# Patient Record
Sex: Male | Born: 1950 | Race: White | Hispanic: No | Marital: Married | Smoking: Never smoker
Health system: Southern US, Community
[De-identification: ages and names within clinical notes are randomized; demographics above are authoritative.]

## PROBLEM LIST (undated history)

## (undated) DIAGNOSIS — R32 Unspecified urinary incontinence: Secondary | ICD-10-CM

## (undated) DIAGNOSIS — M5030 Other cervical disc degeneration, unspecified cervical region: Secondary | ICD-10-CM

## (undated) DIAGNOSIS — G4733 Obstructive sleep apnea (adult) (pediatric): Secondary | ICD-10-CM

## (undated) DIAGNOSIS — G20A1 Parkinson's disease without dyskinesia, without mention of fluctuations: Secondary | ICD-10-CM

## (undated) DIAGNOSIS — M47812 Spondylosis without myelopathy or radiculopathy, cervical region: Secondary | ICD-10-CM

## (undated) DIAGNOSIS — Z973 Presence of spectacles and contact lenses: Secondary | ICD-10-CM

## (undated) DIAGNOSIS — F32A Depression, unspecified: Secondary | ICD-10-CM

## (undated) DIAGNOSIS — G20 Parkinson's disease: Secondary | ICD-10-CM

## (undated) DIAGNOSIS — F329 Major depressive disorder, single episode, unspecified: Secondary | ICD-10-CM

## (undated) DIAGNOSIS — F419 Anxiety disorder, unspecified: Secondary | ICD-10-CM

## (undated) DIAGNOSIS — M1990 Unspecified osteoarthritis, unspecified site: Secondary | ICD-10-CM

## (undated) DIAGNOSIS — I10 Essential (primary) hypertension: Secondary | ICD-10-CM

## (undated) DIAGNOSIS — R51 Headache: Secondary | ICD-10-CM

## (undated) DIAGNOSIS — M48061 Spinal stenosis, lumbar region without neurogenic claudication: Secondary | ICD-10-CM

## (undated) DIAGNOSIS — N201 Calculus of ureter: Secondary | ICD-10-CM

## (undated) HISTORY — DX: Other cervical disc degeneration, unspecified cervical region: M50.30

## (undated) HISTORY — PX: KNEE ARTHROSCOPY W/ DEBRIDEMENT: SHX1867

---

## 1998-08-15 HISTORY — PX: CHOLECYSTECTOMY: SHX55

## 2005-10-13 ENCOUNTER — Ambulatory Visit (HOSPITAL_BASED_OUTPATIENT_CLINIC_OR_DEPARTMENT_OTHER): Admission: RE | Admit: 2005-10-13 | Discharge: 2005-10-13 | Payer: Self-pay | Admitting: Orthopedic Surgery

## 2006-04-28 ENCOUNTER — Encounter: Admission: RE | Admit: 2006-04-28 | Discharge: 2006-04-28 | Payer: Self-pay | Admitting: Orthopedic Surgery

## 2006-06-07 ENCOUNTER — Encounter: Admission: RE | Admit: 2006-06-07 | Discharge: 2006-06-07 | Payer: Self-pay | Admitting: Orthopaedic Surgery

## 2008-04-07 ENCOUNTER — Ambulatory Visit (HOSPITAL_BASED_OUTPATIENT_CLINIC_OR_DEPARTMENT_OTHER): Admission: RE | Admit: 2008-04-07 | Discharge: 2008-04-07 | Payer: Self-pay | Admitting: Orthopedic Surgery

## 2009-11-13 ENCOUNTER — Encounter: Admission: RE | Admit: 2009-11-13 | Discharge: 2009-11-13 | Payer: Self-pay | Admitting: Neurosurgery

## 2010-01-05 ENCOUNTER — Inpatient Hospital Stay (HOSPITAL_COMMUNITY): Admission: RE | Admit: 2010-01-05 | Discharge: 2010-01-07 | Payer: Self-pay | Admitting: Neurosurgery

## 2010-01-05 HISTORY — PX: LUMBAR LAMINECTOMY: SHX95

## 2010-08-06 ENCOUNTER — Encounter
Admission: RE | Admit: 2010-08-06 | Discharge: 2010-08-06 | Payer: Self-pay | Source: Home / Self Care | Attending: Neurosurgery | Admitting: Neurosurgery

## 2010-11-01 LAB — DIFFERENTIAL
Eosinophils Absolute: 0.1 10*3/uL (ref 0.0–0.7)
Eosinophils Relative: 2 % (ref 0–5)
Lymphocytes Relative: 28 % (ref 12–46)
Lymphs Abs: 2 10*3/uL (ref 0.7–4.0)
Monocytes Absolute: 0.7 10*3/uL (ref 0.1–1.0)
Monocytes Relative: 11 % (ref 3–12)

## 2010-11-01 LAB — CBC
MCHC: 35.3 g/dL (ref 30.0–36.0)
MCV: 85 fL (ref 78.0–100.0)
Platelets: 201 10*3/uL (ref 150–400)
RBC: 4.63 MIL/uL (ref 4.22–5.81)

## 2010-11-01 LAB — URINALYSIS, ROUTINE W REFLEX MICROSCOPIC
Bilirubin Urine: NEGATIVE
Nitrite: NEGATIVE
Specific Gravity, Urine: 1.026 (ref 1.005–1.030)
Urobilinogen, UA: 0.2 mg/dL (ref 0.0–1.0)
pH: 5 (ref 5.0–8.0)

## 2010-11-01 LAB — COMPREHENSIVE METABOLIC PANEL
ALT: 15 U/L (ref 0–53)
AST: 33 U/L (ref 0–37)
Albumin: 4.1 g/dL (ref 3.5–5.2)
CO2: 29 mEq/L (ref 19–32)
Calcium: 9.3 mg/dL (ref 8.4–10.5)
Creatinine, Ser: 0.78 mg/dL (ref 0.4–1.5)
GFR calc Af Amer: >60 mL/min (ref >60)
GFR calc non Af Amer: >60 mL/min (ref >60)
Sodium: 142 mEq/L (ref 135–145)
Total Protein: 7 g/dL (ref 6.0–8.3)

## 2010-11-01 LAB — PROTIME-INR: Prothrombin Time: 12.8 seconds (ref 11.6–15.2)

## 2010-11-01 LAB — SURGICAL PCR SCREEN
MRSA, PCR: POSITIVE — AB
Staphylococcus aureus: POSITIVE — AB

## 2010-12-28 NOTE — Op Note (Signed)
James Schultz, James Schultz              ACCOUNT NO.:  [id number]   MEDICAL RECORD NO.:  [id number]          PATIENT TYPE:  AMB   LOCATION:  DSC                          FACILITY:  [facility]   PHYSICIAN:  Feliberto Gottron. Turner Daniels, M.D.   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   POSTOPERATIVE DIAGNOSES:  Right knee complex posterior horn medial  meniscal tear, grade 3 chondromalacia pretty much global to the medial  femoral condyle, medial tibial condyle, lateral tibial condyle, and  focal to the lateral femoral condyle, also the trochlea.  There were  multiple cartilaginous loose bodies as well.   PROCEDURE:  Right knee arthroscopic partial medial meniscectomy, removal  of loose bodies, and debridement of chondromalacia.   SURGEON:  Feliberto Gottron.  Turner Daniels, MD   FIRST ASSISTANT:  None.   ANESTHETIC:  General LMA.   ESTIMATED BLOOD LOSS:  Minimal.   FLUID REPLACEMENT:  800 mL of crystalloid.   DRAINS PLACED:  None.   TOURNIQUET TIME:  None.   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  He desires elective arthroscopic evaluation and  treatment having failed conservative measures with anti-inflammatory  medicine, observation, and attempts at exercise.  The knee is actually  preventing exercise at this point.  In any event, risks and benefits of  surgery discussed, questions answered.   DESCRIPTION OF PROCEDURE:  The patient was identified by armband, taken  to the operating room at the [facility] where the  appropriate anesthetic monitors were attached and general LMA anesthesia  was induced with the patient in supine position.  A lateral post  was  applied to the table and the right lower extremity was prepped and  draped in usual sterile fashion from the ankle to the mid thigh.  The  parapatellar portals in the knee itself were then infiltrated with 5 mL  of 0.5% Marcaine and epinephrine in each portal region and another 10 mL  into the joint itself.  A standard time-out procedure was performed, and  then using a #11 blade, standard inferomedial and inferolateral  peripatellar portals were made.  The arthroscope was inserted through  the inferolateral portal with a pump pressure set between 60 and 90 mm  of pressure and the outflow through the inferomedial portal.  We  immediately encountered multiple cartilaginous loose bodies in the joint  fluid and these were taken through the outflow.  The patella had grade 2  chondromalacia that was lightly debrided.  The trochlea of the medial  femoral condyle, medial tibial condyle, lateral tibial condyle, and  trochlea of the lateral femoral condyle, all had grade 3 chondromalacia  with SLAP tears and this was debrided back to a stable margin.  These  were the donor sites of the loose bodies.  There was no actual bare bone  encountered.  The cruciate ligaments were intact.  The posterior horn of  the medial meniscus was shredded with complex tearing and this was  debrided back to a stable margin with a 3.5 Gator sucker shaver, the  same instrument that was used to debride the chondromalacia.  The  gutters were cleared medially and laterally.  The lateral meniscus was  noted to be in good condition and again the cruciate ligaments were in  good condition as well.  The knee was irrigated out with normal saline  solution.  The arthroscopic instruments were removed.  A dressing of  Xeroform, 4x4 dressing, sponges, Webril, and Ace wrap applied.  The  patient was then awakened and taken to the recovery room without  difficulty.      Feliberto Gottron. Turner Daniels, M.D.  Electronically Signed      FJR/MEDQ  D:  04/07/2008  T:  04/08/2008  Job:  [id number]

## 2010-12-31 NOTE — Op Note (Signed)
NAMECLARANCE, BOLLARD NO.:  [id number]   MEDICAL RECORD NO.:  [id number]          PATIENT TYPE:  AMB   LOCATION:  DSC                          FACILITY:  [facility]   PHYSICIAN:  Loreta Ave, M.D. DATE OF BIRTH:  [birth year]   DATE OF PROCEDURE:  10/13/2005  DATE OF DISCHARGE:                                 OPERATIVE REPORT   PREOPERATIVE DIAGNOSIS:  Tricompartmental chondromalacia and medial meniscus  tear, left knee.   POSTOPERATIVE DIAGNOSIS:  Tricompartmental chondromalacia and medial  meniscus tear, left knee, grade 2 and 3 chondromalacia throughout the  patellofemoral joint, portion of the medial femoral condyle, and lateral  tibial plateau, medial plica.   PROCEDURE:  Left knee exam under anesthesia, arthroscopy, debridement of  medial meniscus tear, excision medial plica, chondroplasty of the  patellofemoral joint as well as medial femoral condyle and lateral tibial  plateau.   SURGEON:  Loreta Ave, M.D.   ASSISTANT:  Genene Churn. Barry Dienes, P.A.-C.   ANESTHESIA:  General.   ESTIMATED BLOOD LOSS:  Minimal.   TOURNIQUET TIME:  None placed.   SPECIMENS:  None.   CULTURES:  None.   COMPLICATIONS:  None.   DRESSINGS:  Soft compressive.   PROCEDURE:  The patient was brought to the operating room and after adequate  anesthesia had been obtained, the left knee was examined.  Full motion.  Patellofemoral crepitus but no instability.  Negative Lachman and drawer.  Tourniquet and leg holder applied.  The leg was prepped and draped in the  usual sterile fashion.  Three portals were created, one superolateral and  one each medial and lateral parapatellar.  Inflow catheter introduced, knee  distended, arthroscope introduced, knee inspected.  Good patellofemoral  tracking but diffuse grade 2 and 3 changes both on the patella and trochlea.  Chondroplasty to a stable surface.  Almost the entire trochlea and patella  involved.  Large fibrotic medial plica  extending halfway across the  patellofemoral joint resected.  Medial meniscus complex tearing with part of  the meniscus folded underneath itself involving much of the middle and the  entire posterior third of the medial meniscus.  This was saucerized down  almost to the rim in the back.  Tapered into remaining meniscus salving the  anterior third.  Focal moderate grade 3 changes 1.5 cm in diameter right in  the middle of the weight-bearing dome treated with chondroplasty.  Not full  thickness.  Cruciate ligament intact and functional.  Some spurring of the  notch but not creating impingement.  Lateral meniscus intact.  Focal deep  grade 3 lesion centrally over the entire lateral tibial plateau weight-  bearing dome.  Chondroplasty to a stable surface.  Not grade 4.  At  completion, all recesses examined, all loose fragments removed.  The entire  knee examined and no other findings.  The instruments and fluid were  removed.  The portals of the knee were injected with Marcaine.  The portals  were closed with 4-0 nylon.  Sterile compressive dressing applied.  Anesthesia reversed.  Brought to the recovery room.  Tolerated the surgery  well without complications.      Loreta Ave, M.D.  Electronically Signed     DFM/MEDQ  D:  10/13/2005  T:  10/13/2005  Job:  [id number]

## 2013-02-21 NOTE — Progress Notes (Signed)
Need orders please - Pt coming for PREOP WED 02/27/13 - Thank you

## 2013-02-24 ENCOUNTER — Other Ambulatory Visit: Payer: Self-pay | Admitting: Orthopedic Surgery

## 2013-02-24 MED ORDER — DEXAMETHASONE SODIUM PHOSPHATE 10 MG/ML IJ SOLN: 10.0000 mg | Freq: Once | INTRAMUSCULAR | Status: DC

## 2013-02-24 MED ORDER — BUPIVACAINE LIPOSOME 1.3 % IJ SUSP: 20.0000 mL | Freq: Once | INTRAMUSCULAR | Status: DC

## 2013-02-24 NOTE — Progress Notes (Signed)
Preoperative surgical orders have been place into the Epic hospital system for James Schultz on 02/24/2013, 10:24 PM  by Patrica Duel for surgery on 03/04/2013.  Preop Total Knee orders including Experal, PO Tylenol, and IV Decadron as long as there are no contraindications to the above medications. Avel Peace, PA-C

## 2013-02-26 ENCOUNTER — Other Ambulatory Visit (HOSPITAL_COMMUNITY): Payer: Self-pay | Admitting: Orthopedic Surgery

## 2013-02-26 ENCOUNTER — Encounter (HOSPITAL_COMMUNITY): Payer: Self-pay | Admitting: Pharmacy Technician

## 2013-02-26 NOTE — Patient Instructions (Addendum)
20 James Schultz  02/26/2013   Your procedure is scheduled on:  03/04/13  MONDAY  Report to [facility] at  [phone]     AM.  Call this number if you have problems the morning of surgery: [phone]       Remember:bring cpap tubing and prongs   Do not eat food  Or drink :After Midnight.SUNDAY NIGHT   Take these medicines the morning of surgery with A SIP OF WATER:bupropion, sinemet, mirapex, azilect   .  Contacts, dentures or partial plates can not be worn to surgery  Leave suitcase in the car. After surgery it may be brought to your room.  For patients admitted to the hospital, checkout time is 11:00 AM day of  discharge.             SPECIAL INSTRUCTIONS- SEE [place] PREPARING FOR SURGERY INSTRUCTION SHEET-     DO NOT WEAR JEWELRY, LOTIONS, POWDERS, OR PERFUMES.  WOMEN-- DO NOT SHAVE LEGS OR UNDERARMS FOR 12 HOURS BEFORE SHOWERS. MEN MAY SHAVE FACE.  Patients discharged the day of surgery will not be allowed to drive home. IF going home the day of surgery, you must have a driver and someone to stay with you for the first 24 hours  Name and phone number of your driver:      ADMISSION                                                                  Please read over the following fact sheets that you were given: MRSA Information, Incentive Spirometry Sheet, Blood Transfusion Sheet  Information                                                                                   [place] PST [phone]  [phone]                 FAILURE TO FOLLOW THESE INSTRUCTIONS MAY RESULT IN  CANCELLATION   OF YOUR SURGERY                                                  Patient Signature _____________________________

## 2013-02-27 ENCOUNTER — Ambulatory Visit (HOSPITAL_COMMUNITY)
Admission: RE | Admit: 2013-02-27 | Discharge: 2013-02-27 | Disposition: A | Discharge status: Home / Self Care | Payer: Medicare Other | Source: Ambulatory Visit | Attending: Orthopedic Surgery | Admitting: Orthopedic Surgery

## 2013-02-27 ENCOUNTER — Encounter (HOSPITAL_COMMUNITY)
Admission: RE | Admit: 2013-02-27 | Discharge: 2013-02-27 | Disposition: A | Discharge status: Home / Self Care | Payer: Medicare Other | Source: Ambulatory Visit | Attending: Orthopedic Surgery | Admitting: Orthopedic Surgery

## 2013-02-27 ENCOUNTER — Encounter (HOSPITAL_COMMUNITY): Payer: Self-pay

## 2013-02-27 DIAGNOSIS — Z01818 Encounter for other preprocedural examination: Secondary | ICD-10-CM | POA: Insufficient documentation

## 2013-02-27 DIAGNOSIS — Z01810 Encounter for preprocedural cardiovascular examination: Secondary | ICD-10-CM | POA: Insufficient documentation

## 2013-02-27 DIAGNOSIS — M1710 Unilateral primary osteoarthritis, unspecified knee: Secondary | ICD-10-CM | POA: Insufficient documentation

## 2013-02-27 DIAGNOSIS — J8410 Pulmonary fibrosis, unspecified: Secondary | ICD-10-CM | POA: Insufficient documentation

## 2013-02-27 HISTORY — DX: Headache: R51

## 2013-02-27 HISTORY — DX: Major depressive disorder, single episode, unspecified: F32.9

## 2013-02-27 HISTORY — DX: Parkinson's disease without dyskinesia, without mention of fluctuations: G20.A1

## 2013-02-27 HISTORY — DX: Anxiety disorder, unspecified: F41.9

## 2013-02-27 HISTORY — DX: Parkinson's disease: G20

## 2013-02-27 HISTORY — DX: Depression, unspecified: F32.A

## 2013-02-27 HISTORY — DX: Essential (primary) hypertension: I10

## 2013-02-27 HISTORY — DX: Spinal stenosis, lumbar region without neurogenic claudication: M48.061

## 2013-02-27 LAB — URINALYSIS, ROUTINE W REFLEX MICROSCOPIC
Glucose, UA: NEGATIVE mg/dL
Hgb urine dipstick: NEGATIVE
Ketones, ur: NEGATIVE mg/dL
Protein, ur: NEGATIVE mg/dL
pH: 6.5 (ref 5.0–8.0)

## 2013-02-27 LAB — CBC
HCT: 41.5 % (ref 39.0–52.0)
Hemoglobin: 13.9 g/dL (ref 13.0–17.0)
MCH: 28.7 pg (ref 26.0–34.0)
MCV: 85.7 fL (ref 78.0–100.0)
Platelets: 209 10*3/uL (ref 150–400)
RBC: 4.84 MIL/uL (ref 4.22–5.81)

## 2013-02-27 LAB — COMPREHENSIVE METABOLIC PANEL
BUN: 15 mg/dL (ref 6–23)
CO2: 30 mEq/L (ref 19–32)
Calcium: 9.8 mg/dL (ref 8.4–10.5)
Creatinine, Ser: 0.79 mg/dL (ref 0.50–1.35)
GFR calc Af Amer: >90 mL/min (ref >90)
GFR calc non Af Amer: >90 mL/min (ref >90)
Glucose, Bld: 100 mg/dL — ABNORMAL HIGH (ref 70–99)

## 2013-02-27 LAB — ABO/RH: ABO/RH(D): A NEG

## 2013-02-27 LAB — PROTIME-INR
INR: 0.97 (ref 0.00–1.49)
Prothrombin Time: 12.7 seconds (ref 11.6–15.2)

## 2013-03-03 ENCOUNTER — Other Ambulatory Visit: Payer: Self-pay | Admitting: Surgical

## 2013-03-03 NOTE — H&P (Signed)
TOTAL KNEE ADMISSION H&P  Patient is being admitted for left total knee arthroplasty.  Subjective:  Chief Complaint:left knee pain.  HPI: James Schultz, 62 y.o. male, has a history of pain and functional disability in the left knee due to arthritis and has failed non-surgical conservative treatments for greater than 12 weeks to includeNSAID's and/or analgesics, corticosteriod injections, viscosupplementation injections and activity modification.  Onset of symptoms was gradual, starting 3 years ago with gradually worsening course since that time. The patient noted no past surgery on the left knee(s).  Patient currently rates pain in the left knee(s) at 5 out of 10 with activity. Patient has night pain, worsening of pain with activity and weight bearing, pain that interferes with activities of daily living, pain with passive range of motion, crepitus and joint swelling.  Patient has evidence of periarticular osteophytes and joint space narrowing by imaging studies.  There is no active infection.   Past Medical History  Diagnosis Date  . Hypertension   . Anxiety   . Depression   . Parkinson's disease     lov note dr love 10-25-2011, lov note [facility] everywhere 02-19-2013 dr Azucena Cecil scott  . Headache(784.0)   . Overactive bladder   . Overactive bladder   . Spinal stenosis of lumbar region   . Dysrhythmia   . Sleep apnea     cpap, setting of 12, uses [place] prongs no mask used    Past Surgical History  Procedure Laterality Date  . Back surgery  2010    lumbar  . Right knee arthroscopy  2009     Current outpatient prescriptions: buPROPion (WELLBUTRIN SR) 150 MG 12 hr tablet, Take 150 mg by mouth 2 (two) times daily., Disp: , Rfl: ;  carbidopa-levodopa (SINEMET IR) 25-100 MG per tablet, Take 1 tablet by mouth 5 (five) times daily., Disp: , Rfl: ;  lisinopril (PRINIVIL,ZESTRIL) 10 MG tablet, Take 10 mg by mouth every morning., Disp: , Rfl:  LORazepam (ATIVAN) 1 MG tablet, Take 1 mg by mouth 4  (four) times daily as needed for anxiety., Disp: , Rfl: ;  oxyCODONE-acetaminophen (PERCOCET) 10-325 MG per tablet, Take 1-2 tablets by mouth every 8 (eight) hours as needed for pain., Disp: , Rfl: ;  pramipexole (MIRAPEX) 1 MG tablet, Take 0.5-1 mg by mouth 3 (three) times daily. Takes 1 tablet in the morning 6am and half tablet at 10 am and half tablet at 2 pm, Disp: , Rfl:  rasagiline (AZILECT) 1 MG TABS, Take 1 mg by mouth every morning. , Disp: , Rfl:   No Known Allergies  History  Substance Use Topics  . Smoking status: Never Smoker   . Smokeless tobacco: Never Used  . Alcohol Use: No    Family History Father deceased due to sepsis Mother deceased due to Alzheimer's   Review of Systems  Constitutional: Negative.   HENT: Negative.  Negative for neck pain.   Eyes: Positive for double vision. Negative for blurred vision, photophobia, pain, discharge and redness.  Respiratory: Positive for cough and shortness of breath. Negative for hemoptysis and sputum production.        With exertion  Cardiovascular: Positive for leg swelling. Negative for chest pain, palpitations, orthopnea, claudication and PND.  Gastrointestinal: Positive for constipation. Negative for heartburn, nausea, vomiting, abdominal pain, diarrhea, blood in stool and melena.  Genitourinary: Positive for frequency. Negative for dysuria, urgency, hematuria and flank pain.  Musculoskeletal: Positive for back pain and joint pain. Negative for myalgias and falls.  Left knee pain  Skin: Negative.   Neurological: Positive for tremors. Negative for dizziness, tingling, sensory change, speech change, focal weakness, seizures and loss of consciousness.  Endo/Heme/Allergies: Negative.   Psychiatric/Behavioral: Negative for depression, suicidal ideas, hallucinations, memory loss and substance abuse. The patient has insomnia.     Objective:  Physical Exam  Constitutional: He is oriented to person, place, and time. He appears  well-developed and well-nourished. No distress.  HENT:  Head: Normocephalic and atraumatic.  Right Ear: External ear normal.  Nose: Nose normal.  Mouth/Throat: Oropharynx is clear and moist.  Eyes: Conjunctivae and EOM are normal.  Neck: Normal range of motion. Neck supple. No tracheal deviation present. No thyromegaly present.  Cardiovascular: Normal rate, regular rhythm, normal heart sounds and intact distal pulses.   No murmur heard. Respiratory: Effort normal. No respiratory distress. He has decreased breath sounds. He has no wheezes. He exhibits no tenderness.  GI: Soft. Bowel sounds are normal. He exhibits no distension and no mass. There is no tenderness.  Musculoskeletal:       Right hip: Normal.       Left hip: Normal.       Right knee: Normal.       Left knee: He exhibits decreased range of motion and swelling. He exhibits no effusion and no erythema. Tenderness found. Medial joint line and lateral joint line tenderness noted.       Right lower leg: He exhibits no tenderness and no swelling.       Left lower leg: He exhibits no tenderness and no swelling.  His hips show normal motion, no discomfort. The left knee shows range about 5 to 110. There is moderate crepitus on range of motion. He has significant medial greater than lateral jointline tenderness and no instability noted.  Lymphadenopathy:    He has no cervical adenopathy.  Neurological: He is alert and oriented to person, place, and time. He has normal strength and normal reflexes. He displays tremor. No sensory deficit.  Skin: No rash noted. He is not diaphoretic. No erythema.  Psychiatric: He has a normal mood and affect. His behavior is normal.    Vitals Weight: 253 lb Height: 69.5 in Body Surface Area: 2.37 m Body Mass Index: 36.83 kg/m Pulse: 72 (Regular) BP: 144/86 (Sitting, Left Arm, Standard)  Imaging Review Plain radiographs demonstrate severe degenerative joint disease of the left knee(s).  The overall alignment ismild varus. The bone quality appears to be fair for age and reported activity level.  Assessment/Plan:  End stage arthritis, left knee   The patient history, physical examination, clinical judgment of the provider and imaging studies are consistent with end stage degenerative joint disease of the left knee(s) and total knee arthroplasty is deemed medically necessary. The treatment options including medical management, injection therapy arthroscopy and arthroplasty were discussed at length. The risks and benefits of total knee arthroplasty were presented and reviewed. The risks due to aseptic loosening, infection, stiffness, patella tracking problems, thromboembolic complications and other imponderables were discussed. The patient acknowledged the explanation, agreed to proceed with the plan and consent was signed. Patient is being admitted for inpatient treatment for surgery, pain control, PT, OT, prophylactic antibiotics, VTE prophylaxis, progressive ambulation and ADL's and discharge planning. The patient is planning to be discharged home with home health services    [place], [place]

## 2013-03-04 ENCOUNTER — Encounter (HOSPITAL_COMMUNITY): Payer: Self-pay | Admitting: *Deleted

## 2013-03-04 ENCOUNTER — Encounter (HOSPITAL_COMMUNITY)
Admission: RE | Disposition: A | Discharge status: Home Health | Payer: Self-pay | Source: Ambulatory Visit | Attending: Orthopedic Surgery

## 2013-03-04 ENCOUNTER — Inpatient Hospital Stay (HOSPITAL_COMMUNITY): Payer: Medicare Other | Admitting: *Deleted

## 2013-03-04 ENCOUNTER — Inpatient Hospital Stay (HOSPITAL_COMMUNITY)
Admission: RE | Admit: 2013-03-04 | Discharge: 2013-03-06 | DRG: 470 | Disposition: A | Discharge status: Home Health | Payer: Medicare Other | Source: Ambulatory Visit | Attending: Orthopedic Surgery | Admitting: Orthopedic Surgery

## 2013-03-04 DIAGNOSIS — I10 Essential (primary) hypertension: Secondary | ICD-10-CM

## 2013-03-04 DIAGNOSIS — F3289 Other specified depressive episodes: Secondary | ICD-10-CM | POA: Diagnosis present

## 2013-03-04 DIAGNOSIS — G20 Parkinson's disease: Secondary | ICD-10-CM | POA: Diagnosis present

## 2013-03-04 DIAGNOSIS — Z96652 Presence of left artificial knee joint: Secondary | ICD-10-CM

## 2013-03-04 DIAGNOSIS — Z96659 Presence of unspecified artificial knee joint: Secondary | ICD-10-CM

## 2013-03-04 DIAGNOSIS — F411 Generalized anxiety disorder: Secondary | ICD-10-CM | POA: Diagnosis present

## 2013-03-04 DIAGNOSIS — G20A1 Parkinson's disease without dyskinesia, without mention of fluctuations: Secondary | ICD-10-CM | POA: Diagnosis present

## 2013-03-04 DIAGNOSIS — G4730 Sleep apnea, unspecified: Secondary | ICD-10-CM | POA: Diagnosis present

## 2013-03-04 DIAGNOSIS — N318 Other neuromuscular dysfunction of bladder: Secondary | ICD-10-CM | POA: Diagnosis present

## 2013-03-04 DIAGNOSIS — G4733 Obstructive sleep apnea (adult) (pediatric): Secondary | ICD-10-CM

## 2013-03-04 DIAGNOSIS — M179 Osteoarthritis of knee, unspecified: Secondary | ICD-10-CM | POA: Diagnosis present

## 2013-03-04 DIAGNOSIS — Z6835 Body mass index (BMI) 35.0-35.9, adult: Secondary | ICD-10-CM

## 2013-03-04 DIAGNOSIS — M1710 Unilateral primary osteoarthritis, unspecified knee: Secondary | ICD-10-CM | POA: Diagnosis present

## 2013-03-04 DIAGNOSIS — F329 Major depressive disorder, single episode, unspecified: Secondary | ICD-10-CM | POA: Diagnosis present

## 2013-03-04 HISTORY — PX: TOTAL KNEE ARTHROPLASTY: SHX125

## 2013-03-04 LAB — TYPE AND SCREEN: ABO/RH(D): A NEG

## 2013-03-04 LAB — ABO/RH: ABO/RH(D): A NEG

## 2013-03-04 SURGERY — ARTHROPLASTY, KNEE, TOTAL
Anesthesia: General | Site: Knee | Laterality: Left | Wound class: Clean

## 2013-03-04 MED ORDER — MENTHOL 3 MG MT LOZG: 1.0000 | LOZENGE | OROMUCOSAL | Status: DC | PRN

## 2013-03-04 MED ORDER — ACETAMINOPHEN 650 MG RE SUPP
650.0000 mg | Freq: Four times a day (QID) | RECTAL | Status: AC
  Filled 2013-03-04 (×4): qty 1

## 2013-03-04 MED ORDER — TRANEXAMIC ACID 100 MG/ML IV SOLN
1000.0000 mg | INTRAVENOUS | Status: AC
  Administered 2013-03-04: 1000 mg via INTRAVENOUS
  Filled 2013-03-04: qty 10

## 2013-03-04 MED ORDER — LORAZEPAM 1 MG PO TABS
1.0000 mg | ORAL_TABLET | Freq: Four times a day (QID) | ORAL | Status: DC | PRN
  Administered 2013-03-04 – 2013-03-05 (×3): 1 mg via ORAL
  Filled 2013-03-04 (×3): qty 1

## 2013-03-04 MED ORDER — BUPIVACAINE HCL (PF) 0.25 % IJ SOLN
INTRAMUSCULAR | Status: DC | PRN
  Administered 2013-03-04: 20 mL

## 2013-03-04 MED ORDER — DEXAMETHASONE SODIUM PHOSPHATE 10 MG/ML IJ SOLN: 10.0000 mg | Freq: Every day | INTRAMUSCULAR | Status: AC

## 2013-03-04 MED ORDER — PRAMIPEXOLE DIHYDROCHLORIDE 0.25 MG PO TABS
0.5000 mg | ORAL_TABLET | ORAL | Status: DC
  Administered 2013-03-04: 17:00:00 via ORAL
  Administered 2013-03-05 – 2013-03-06 (×3): 0.5 mg via ORAL
  Filled 2013-03-04 (×5): qty 2

## 2013-03-04 MED ORDER — DIPHENHYDRAMINE HCL 12.5 MG/5ML PO ELIX
12.5000 mg | ORAL_SOLUTION | ORAL | Status: DC | PRN
  Filled 2013-03-04: qty 10

## 2013-03-04 MED ORDER — SODIUM CHLORIDE 0.9 % IJ SOLN
INTRAMUSCULAR | Status: DC | PRN
  Administered 2013-03-04: 30 mL

## 2013-03-04 MED ORDER — HYDROMORPHONE HCL PF 1 MG/ML IJ SOLN
INTRAMUSCULAR | Status: DC | PRN
  Administered 2013-03-04: 2 mg via INTRAVENOUS

## 2013-03-04 MED ORDER — METOCLOPRAMIDE HCL 5 MG PO TABS
5.0000 mg | ORAL_TABLET | Freq: Three times a day (TID) | ORAL | Status: DC | PRN
  Filled 2013-03-04: qty 2

## 2013-03-04 MED ORDER — METOCLOPRAMIDE HCL 5 MG/ML IJ SOLN: 5.0000 mg | Freq: Three times a day (TID) | INTRAMUSCULAR | Status: DC | PRN

## 2013-03-04 MED ORDER — KETOROLAC TROMETHAMINE 15 MG/ML IJ SOLN
15.0000 mg | Freq: Four times a day (QID) | INTRAMUSCULAR | Status: AC | PRN
  Administered 2013-03-04 (×2): 15 mg via INTRAVENOUS
  Filled 2013-03-04 (×2): qty 1

## 2013-03-04 MED ORDER — 0.9 % SODIUM CHLORIDE (POUR BTL) OPTIME
TOPICAL | Status: DC | PRN
  Administered 2013-03-04: 1000 mL

## 2013-03-04 MED ORDER — CEFAZOLIN SODIUM-DEXTROSE 2-3 GM-% IV SOLR
2.0000 g | INTRAVENOUS | Status: AC
Start: 2013-03-04 — End: 2013-03-04
  Administered 2013-03-04: 2 g via INTRAVENOUS

## 2013-03-04 MED ORDER — RIVAROXABAN 10 MG PO TABS
10.0000 mg | ORAL_TABLET | Freq: Every day | ORAL | Status: DC
  Administered 2013-03-05 – 2013-03-06 (×2): 10 mg via ORAL
  Filled 2013-03-04 (×3): qty 1

## 2013-03-04 MED ORDER — FLEET ENEMA 7-19 GM/118ML RE ENEM: 1.0000 | ENEMA | Freq: Once | RECTAL | Status: AC | PRN

## 2013-03-04 MED ORDER — SUCCINYLCHOLINE CHLORIDE 20 MG/ML IJ SOLN
INTRAMUSCULAR | Status: DC | PRN
  Administered 2013-03-04: 100 mg via INTRAVENOUS

## 2013-03-04 MED ORDER — OXYCODONE HCL 5 MG PO TABS
5.0000 mg | ORAL_TABLET | ORAL | Status: DC | PRN
  Administered 2013-03-04: 10 mg via ORAL
  Administered 2013-03-04 (×2): 5 mg via ORAL
  Administered 2013-03-05 – 2013-03-06 (×8): 10 mg via ORAL
  Filled 2013-03-04 (×4): qty 2
  Filled 2013-03-04: qty 1
  Filled 2013-03-04 (×6): qty 2

## 2013-03-04 MED ORDER — BUPROPION HCL ER (SR) 150 MG PO TB12
150.0000 mg | ORAL_TABLET | Freq: Two times a day (BID) | ORAL | Status: DC
  Filled 2013-03-04 (×2): qty 1

## 2013-03-04 MED ORDER — BISACODYL 10 MG RE SUPP: 10.0000 mg | Freq: Every day | RECTAL | Status: DC | PRN

## 2013-03-04 MED ORDER — DEXAMETHASONE SODIUM PHOSPHATE 4 MG/ML IJ SOLN
INTRAMUSCULAR | Status: DC | PRN
  Administered 2013-03-04: 10 mg via INTRAVENOUS

## 2013-03-04 MED ORDER — BUPROPION HCL ER (SR) 150 MG PO TB12
150.0000 mg | ORAL_TABLET | Freq: Two times a day (BID) | ORAL | Status: DC
  Administered 2013-03-04 – 2013-03-06 (×4): 150 mg via ORAL
  Filled 2013-03-04 (×5): qty 1

## 2013-03-04 MED ORDER — ACETAMINOPHEN 500 MG PO TABS
1000.0000 mg | ORAL_TABLET | Freq: Four times a day (QID) | ORAL | Status: AC
  Administered 2013-03-04 – 2013-03-05 (×4): 1000 mg via ORAL
  Filled 2013-03-04 (×6): qty 2

## 2013-03-04 MED ORDER — BUPIVACAINE LIPOSOME 1.3 % IJ SUSP
20.0000 mL | Freq: Once | INTRAMUSCULAR | Status: DC
  Filled 2013-03-04: qty 20

## 2013-03-04 MED ORDER — KETAMINE HCL 10 MG/ML IJ SOLN
INTRAMUSCULAR | Status: DC | PRN
  Administered 2013-03-04: 50 mg via INTRAVENOUS

## 2013-03-04 MED ORDER — PROMETHAZINE HCL 25 MG/ML IJ SOLN: 6.2500 mg | INTRAMUSCULAR | Status: DC | PRN

## 2013-03-04 MED ORDER — ONDANSETRON HCL 4 MG/2ML IJ SOLN: 4.0000 mg | Freq: Four times a day (QID) | INTRAMUSCULAR | Status: DC | PRN

## 2013-03-04 MED ORDER — DEXTROSE 5 % IV SOLN
500.0000 mg | Freq: Four times a day (QID) | INTRAVENOUS | Status: DC | PRN
  Filled 2013-03-04: qty 5

## 2013-03-04 MED ORDER — BUPIVACAINE LIPOSOME 1.3 % IJ SUSP
INTRAMUSCULAR | Status: DC | PRN
  Administered 2013-03-04: 20 mL

## 2013-03-04 MED ORDER — CEFAZOLIN SODIUM-DEXTROSE 2-3 GM-% IV SOLR
2.0000 g | Freq: Four times a day (QID) | INTRAVENOUS | Status: AC
  Administered 2013-03-04 (×2): 2 g via INTRAVENOUS
  Filled 2013-03-04 (×2): qty 50

## 2013-03-04 MED ORDER — DOCUSATE SODIUM 100 MG PO CAPS
100.0000 mg | ORAL_CAPSULE | Freq: Two times a day (BID) | ORAL | Status: DC
  Administered 2013-03-04 – 2013-03-06 (×5): 100 mg via ORAL

## 2013-03-04 MED ORDER — SODIUM CHLORIDE 0.9 % IV SOLN: INTRAVENOUS | Status: DC

## 2013-03-04 MED ORDER — CARBIDOPA-LEVODOPA 25-100 MG PO TABS
1.0000 | ORAL_TABLET | Freq: Every day | ORAL | Status: DC
  Administered 2013-03-04 – 2013-03-06 (×10): 1 via ORAL
  Filled 2013-03-04 (×14): qty 1

## 2013-03-04 MED ORDER — DEXTROSE-NACL 5-0.9 % IV SOLN
INTRAVENOUS | Status: DC
  Administered 2013-03-04 (×2): via INTRAVENOUS

## 2013-03-04 MED ORDER — PROPOFOL 10 MG/ML IV BOLUS
INTRAVENOUS | Status: DC | PRN
  Administered 2013-03-04: 200 mg via INTRAVENOUS

## 2013-03-04 MED ORDER — ONDANSETRON HCL 4 MG/2ML IJ SOLN
INTRAMUSCULAR | Status: DC | PRN
  Administered 2013-03-04: 4 mg via INTRAVENOUS

## 2013-03-04 MED ORDER — MIDAZOLAM HCL 5 MG/5ML IJ SOLN
INTRAMUSCULAR | Status: DC | PRN
  Administered 2013-03-04: 2 mg via INTRAVENOUS

## 2013-03-04 MED ORDER — ONDANSETRON HCL 4 MG PO TABS: 4.0000 mg | ORAL_TABLET | Freq: Four times a day (QID) | ORAL | Status: DC | PRN

## 2013-03-04 MED ORDER — LACTATED RINGERS IV SOLN
INTRAVENOUS | Status: DC
  Administered 2013-03-04 (×2): via INTRAVENOUS
  Administered 2013-03-04: 1000 mL via INTRAVENOUS

## 2013-03-04 MED ORDER — RASAGILINE MESYLATE 1 MG PO TABS
1.0000 mg | ORAL_TABLET | Freq: Every morning | ORAL | Status: DC
  Administered 2013-03-05 – 2013-03-06 (×2): 1 mg via ORAL
  Filled 2013-03-04 (×2): qty 1

## 2013-03-04 MED ORDER — DEXAMETHASONE 6 MG PO TABS
10.0000 mg | ORAL_TABLET | Freq: Every day | ORAL | Status: AC
  Administered 2013-03-05: 10 mg via ORAL
  Filled 2013-03-04: qty 1

## 2013-03-04 MED ORDER — PRAMIPEXOLE DIHYDROCHLORIDE 0.25 MG PO TABS: 0.5000 mg | ORAL_TABLET | Freq: Three times a day (TID) | ORAL | Status: DC

## 2013-03-04 MED ORDER — TRAMADOL HCL 50 MG PO TABS: 50.0000 mg | ORAL_TABLET | Freq: Four times a day (QID) | ORAL | Status: DC | PRN

## 2013-03-04 MED ORDER — PRAMIPEXOLE DIHYDROCHLORIDE 1 MG PO TABS
1.0000 mg | ORAL_TABLET | ORAL | Status: DC
  Administered 2013-03-05 – 2013-03-06 (×2): 1 mg via ORAL
  Filled 2013-03-04 (×4): qty 1

## 2013-03-04 MED ORDER — MORPHINE SULFATE 2 MG/ML IJ SOLN: 1.0000 mg | INTRAMUSCULAR | Status: DC | PRN

## 2013-03-04 MED ORDER — SODIUM CHLORIDE 0.9 % IR SOLN
Status: DC | PRN
  Administered 2013-03-04: 1000 mL

## 2013-03-04 MED ORDER — PHENOL 1.4 % MT LIQD: 1.0000 | OROMUCOSAL | Status: DC | PRN

## 2013-03-04 MED ORDER — ACETAMINOPHEN 500 MG PO TABS
1000.0000 mg | ORAL_TABLET | Freq: Once | ORAL | Status: AC
  Administered 2013-03-04: 1000 mg via ORAL
  Filled 2013-03-04: qty 2

## 2013-03-04 MED ORDER — METHOCARBAMOL 500 MG PO TABS
500.0000 mg | ORAL_TABLET | Freq: Four times a day (QID) | ORAL | Status: DC | PRN
  Administered 2013-03-04 – 2013-03-06 (×6): 500 mg via ORAL
  Filled 2013-03-04 (×6): qty 1

## 2013-03-04 MED ORDER — POLYETHYLENE GLYCOL 3350 17 G PO PACK: 17.0000 g | PACK | Freq: Every day | ORAL | Status: DC | PRN

## 2013-03-04 MED ORDER — LACTATED RINGERS IV SOLN: INTRAVENOUS | Status: DC

## 2013-03-04 MED ORDER — HYDROMORPHONE HCL PF 1 MG/ML IJ SOLN
0.2500 mg | INTRAMUSCULAR | Status: DC | PRN
  Administered 2013-03-04 (×2): 0.5 mg via INTRAVENOUS

## 2013-03-04 SURGICAL SUPPLY — 58 items
BAG SPEC THK2 15X12 ZIP CLS (MISCELLANEOUS) ×1
BAG ZIPLOCK 12X15 (MISCELLANEOUS) ×2 IMPLANT
BANDAGE ELASTIC 6 VELCRO ST LF (GAUZE/BANDAGES/DRESSINGS) ×2 IMPLANT
BANDAGE ESMARK 6X9 LF (GAUZE/BANDAGES/DRESSINGS) ×1 IMPLANT
BLADE SAG 18X100X1.27 (BLADE) ×2 IMPLANT
BLADE SAW SGTL 11.0X1.19X90.0M (BLADE) ×2 IMPLANT
BNDG CMPR 9X6 STRL LF SNTH (GAUZE/BANDAGES/DRESSINGS) ×1
BNDG ESMARK 6X9 LF (GAUZE/BANDAGES/DRESSINGS) ×2
BOWL SMART MIX CTS (DISPOSABLE) ×2 IMPLANT
CAPT RP KNEE ×1 IMPLANT
CEMENT HV SMART SET (Cement) ×4 IMPLANT
CLOTH BEACON ORANGE TIMEOUT ST (SAFETY) ×2 IMPLANT
CUFF TOURN SGL QUICK 34 (TOURNIQUET CUFF) ×2
CUFF TRNQT CYL 34X4X40X1 (TOURNIQUET CUFF) ×1 IMPLANT
DECANTER SPIKE VIAL GLASS SM (MISCELLANEOUS) ×2 IMPLANT
DRAPE EXTREMITY T 121X128X90 (DRAPE) ×2 IMPLANT
DRAPE POUCH INSTRU U-SHP 10X18 (DRAPES) ×2 IMPLANT
DRAPE U-SHAPE 47X51 STRL (DRAPES) ×2 IMPLANT
DRSG ADAPTIC 3X8 NADH LF (GAUZE/BANDAGES/DRESSINGS) ×2 IMPLANT
DRSG PAD ABDOMINAL 8X10 ST (GAUZE/BANDAGES/DRESSINGS) ×2 IMPLANT
DURAPREP 26ML APPLICATOR (WOUND CARE) ×2 IMPLANT
ELECT REM PT RETURN 9FT ADLT (ELECTROSURGICAL) ×2
ELECTRODE REM PT RTRN 9FT ADLT (ELECTROSURGICAL) ×1 IMPLANT
EVACUATOR 1/8 PVC DRAIN (DRAIN) ×2 IMPLANT
FACESHIELD LNG OPTICON STERILE (SAFETY) ×10 IMPLANT
GLOVE BIO SURGEON STRL SZ7.5 (GLOVE) IMPLANT
GLOVE BIO SURGEON STRL SZ8 (GLOVE) ×2 IMPLANT
GLOVE BIOGEL PI IND STRL 8 (GLOVE) ×1 IMPLANT
GLOVE BIOGEL PI INDICATOR 8 (GLOVE) ×1
GLOVE SURG SS PI 6.5 STRL IVOR (GLOVE) ×4 IMPLANT
GOWN BRE IMP PREV XXLGXLNG (GOWN DISPOSABLE) ×1 IMPLANT
GOWN STRL NON-REIN LRG LVL3 (GOWN DISPOSABLE) ×4 IMPLANT
GOWN STRL REIN XL XLG (GOWN DISPOSABLE) IMPLANT
HANDPIECE INTERPULSE COAX TIP (DISPOSABLE) ×2
IMMOBILIZER KNEE 20 (SOFTGOODS) ×2
IMMOBILIZER KNEE 20 THIGH 36 (SOFTGOODS) ×1 IMPLANT
KIT BASIN OR (CUSTOM PROCEDURE TRAY) ×2 IMPLANT
MANIFOLD NEPTUNE II (INSTRUMENTS) ×2 IMPLANT
NDL SAFETY ECLIPSE 18X1.5 (NEEDLE) ×2 IMPLANT
NEEDLE HYPO 18GX1.5 SHARP (NEEDLE) ×4
NS IRRIG 1000ML POUR BTL (IV SOLUTION) ×2 IMPLANT
PACK TOTAL JOINT (CUSTOM PROCEDURE TRAY) ×2 IMPLANT
PADDING CAST COTTON 6X4 STRL (CAST SUPPLIES) ×5 IMPLANT
POSITIONER SURGICAL ARM (MISCELLANEOUS) ×2 IMPLANT
SET HNDPC FAN SPRY TIP SCT (DISPOSABLE) ×1 IMPLANT
SPONGE GAUZE 4X4 12PLY (GAUZE/BANDAGES/DRESSINGS) ×2 IMPLANT
STRIP CLOSURE SKIN 1/2X4 (GAUZE/BANDAGES/DRESSINGS) ×4 IMPLANT
SUCTION FRAZIER 12FR DISP (SUCTIONS) ×2 IMPLANT
SUT MNCRL AB 4-0 PS2 18 (SUTURE) ×2 IMPLANT
SUT VIC AB 2-0 CT1 27 (SUTURE) ×6
SUT VIC AB 2-0 CT1 TAPERPNT 27 (SUTURE) ×3 IMPLANT
SUT VLOC 180 0 24IN GS25 (SUTURE) ×2 IMPLANT
SYR 20CC LL (SYRINGE) ×2 IMPLANT
SYR 50ML LL SCALE MARK (SYRINGE) ×2 IMPLANT
TOWEL OR 17X26 10 PK STRL BLUE (TOWEL DISPOSABLE) ×4 IMPLANT
TRAY FOLEY CATH 14FRSI W/METER (CATHETERS) ×2 IMPLANT
WATER STERILE IRR 1500ML POUR (IV SOLUTION) ×3 IMPLANT
WRAP KNEE MAXI GEL POST OP (GAUZE/BANDAGES/DRESSINGS) ×2 IMPLANT

## 2013-03-04 NOTE — Transfer of Care (Signed)
Immediate Anesthesia Transfer of Care Note  Patient: James Schultz  Procedure(s) Performed: Procedure(s) (LRB): LEFT TOTAL KNEE ARTHROPLASTY (Left)  Patient Location: PACU  Anesthesia Type: General  Level of Consciousness: sedated, patient cooperative and responds to stimulaton  Airway & Oxygen Therapy: Patient Spontanous Breathing and Patient connected to face mask oxgen  Post-op Assessment: Report given to PACU RN and Post -op Vital signs reviewed and stable  Post vital signs: Reviewed and stable  Complications: No apparent anesthesia complications

## 2013-03-04 NOTE — Interval H&P Note (Signed)
History and Physical Interval Note:  03/04/2013 7:00 AM  James Schultz  has presented today for surgery, with the diagnosis of OA LEFT KNEE   The various methods of treatment have been discussed with the patient and family. After consideration of risks, benefits and other options for treatment, the patient has consented to  Procedure(s): LEFT TOTAL KNEE ARTHROPLASTY (Left) as a surgical intervention .  The patient's history has been reviewed, patient examined, no change in status, stable for surgery.  I have reviewed the patient's chart and labs.  Questions were answered to the patient's satisfaction.     Loanne Drilling

## 2013-03-04 NOTE — Anesthesia Preprocedure Evaluation (Signed)
Anesthesia Evaluation  Patient identified by MRN, date of birth, ID band Patient awake    Reviewed: Allergy & Precautions, H&P , NPO status , Patient's Chart, lab work & pertinent test results  Airway Mallampati: II TM Distance: >3 FB Neck ROM: Full    Dental  (+) Teeth Intact and Dental Advisory Given   Pulmonary neg pulmonary ROS, sleep apnea and Continuous Positive Airway Pressure Ventilation ,  breath sounds clear to auscultation  Pulmonary exam normal       Cardiovascular hypertension, negative cardio ROS  Rhythm:Regular Rate:Normal     Neuro/Psych  Headaches, Anxiety Depression Parkinsons negative neurological ROS  negative psych ROS   GI/Hepatic negative GI ROS, Neg liver ROS,   Endo/Other  negative endocrine ROS  Renal/GU negative Renal ROS  negative genitourinary   Musculoskeletal negative musculoskeletal ROS (+)   Abdominal   Peds  Hematology negative hematology ROS (+)   Anesthesia Other Findings   Reproductive/Obstetrics                           Anesthesia Physical Anesthesia Plan  ASA: II  Anesthesia Plan: General   Post-op Pain Management:    Induction: Intravenous  Airway Management Planned: Oral ETT  Additional Equipment:   Intra-op Plan:   Post-operative Plan: Extubation in OR  Informed Consent:   Plan Discussed with:   Anesthesia Plan Comments:         Anesthesia Quick Evaluation

## 2013-03-04 NOTE — Evaluation (Signed)
Physical Therapy Evaluation Patient Details Name: James Schultz MRN: [id number] DOB: [birth year] Today's Date: 03/04/2013 Time: 6644-0347 PT Time Calculation (min): 25 min  PT Assessment / Plan / Recommendation History of Present Illness  LTKA on 03/04/13. Pt has H/O Parkinson's Disease. On Sinemet 5x day.  Clinical Impression  Pt tolerated ambulating into hall x 30'. Pt plans DC to home. Continue PT while in acute care.    PT Assessment  Patient needs continued PT services    Follow Up Recommendations  Home health PT    Does the patient have the potential to tolerate intense rehabilitation      Barriers to Discharge        Equipment Recommendations  Rolling walker with 5" wheels    Recommendations for Other Services     Frequency 7X/week    Precautions / Restrictions Precautions Precautions: Fall;Knee Required Braces or Orthoses: Knee Immobilizer - Left Knee Immobilizer - Left: Discontinue once straight leg raise with < 10 degree lag   Pertinent Vitals/Pain Reports pain is < 4     Mobility  Bed Mobility Bed Mobility: Supine to Sit;Sitting - Scoot to Edge of Bed Supine to Sit: 1: +2 Total assist;With rails;HOB elevated Supine to Sit: Patient Percentage: 50% Sitting - Scoot to Edge of Bed: 2: Max assist Details for Bed Mobility Assistance: decreased trunk flexion for sitting up, required assist to get to sitting upright and scooting to edge Transfers Transfers: Sit to Stand;Stand to Sit Sit to Stand: 1: +2 Total assist;From bed;With upper extremity assist Sit to Stand: Patient Percentage: 60% Stand to Sit: 1: +2 Total assist;With upper extremity assist;To chair/3-in-1;With armrests Stand to Sit: Patient Percentage: 60% Details for Transfer Assistance: cues to  reach fro recliner, place LLE forward. Ambulation/Gait Ambulation/Gait Assistance: 1: +2 Total assist Ambulation/Gait: Patient Percentage: 60% Ambulation Distance (Feet): 30 Feet Assistive device: Rolling  walker Ambulation/Gait Assistance Details: several times pt stopped to regrip the RW. Cues for sequence and step length. Gait Pattern: Step-to pattern;Decreased step length - right;Decreased step length - left;Decreased stance time - right;Decreased stance time - left;Festinating;Decreased hip/knee flexion - right;Trunk flexed;Shuffle Gait velocity: decreased.    Exercises     PT Diagnosis: Difficulty walking;Abnormality of gait;Acute pain  PT Problem List: Decreased strength;Decreased balance;Decreased mobility;Decreased coordination;Impaired tone;Decreased knowledge of precautions;Decreased knowledge of use of DME;Decreased safety awareness;Pain PT Treatment Interventions: DME instruction;Gait training;Stair training;Functional mobility training;Therapeutic activities;Therapeutic exercise;Patient/family education     PT Goals(Current goals can be found in the care plan section) Acute Rehab PT Goals Patient Stated Goal: I want to walk  PT Goal Formulation: With patient/family Time For Goal Achievement: 03/11/13 Potential to Achieve Goals: Good  Visit Information  Last PT Received On: 03/04/13 Assistance Needed: +2 History of Present Illness: LTKA on 03/04/13. Pt has H/O Parkinson's Disease. On Sinemet 5x day.       Prior Functioning  Home Living Family/patient expects to be discharged to:: Private residence Living Arrangements: Spouse/significant other Available Help at Discharge: Family Type of Home: House Home Access: Stairs to enter Secretary/administrator of Steps: 1 Entrance Stairs-Rails: None Home Layout: One level Home Equipment: Hospital bed (sleeps in recliner a lot.) Prior Function Level of Independence: Independent with assistive device(s) Communication Communication: No difficulties    Cognition  Cognition Arousal/Alertness: Awake/alert Behavior During Therapy: WFL for tasks assessed/performed Overall Cognitive Status: Within Functional Limits for tasks  assessed    Extremity/Trunk Assessment Upper Extremity Assessment Upper Extremity Assessment: Defer to OT evaluation;RUE deficits/detail;LUE deficits/detail RUE Deficits /  Details: generally increased tone but functionally uses UE for mobility LUE Deficits / Details: same Lower Extremity Assessment Lower Extremity Assessment: RLE deficits/detail;LLE deficits/detail RLE Deficits / Details: noted stiffness with ROM, tends to be increased tone. LLE Deficits / Details: able to lift leg from Bed. increased tone Cervical / Trunk Assessment Cervical / Trunk Assessment: Kyphotic   Balance Balance Balance Assessed: Yes Static Standing Balance Static Standing - Balance Support: Bilateral upper extremity supported Static Standing - Level of Assistance: 4: Min assist Static Standing - Comment/# of Minutes: standing at RW  End of Session PT - End of Session Equipment Utilized During Treatment: Left knee immobilizer Activity Tolerance: Patient tolerated treatment well Patient left: in chair;with call bell/phone within reach;with family/visitor present Nurse Communication: Mobility status  GP     Rada Hay 03/04/2013, 5:31 PM

## 2013-03-04 NOTE — Anesthesia Postprocedure Evaluation (Signed)
Anesthesia Post Note  Patient: James Schultz  Procedure(s) Performed: Procedure(s) (LRB): LEFT TOTAL KNEE ARTHROPLASTY (Left)  Anesthesia type: General  Patient location: PACU  Post pain: Pain level controlled  Post assessment: Post-op Vital signs reviewed  Last Vitals:  Filed Vitals:   03/04/13 1509  BP: 127/77  Pulse: 94  Temp: 36.5 C  Resp: 16    Post vital signs: Reviewed  Level of consciousness: sedated  Complications: No apparent anesthesia complications

## 2013-03-04 NOTE — Progress Notes (Signed)
Physical Therapy Treatment Patient Details Name: James Schultz MRN: [id number] DOB: Oct 02, 1950 Today's Date: 03/04/2013 Time: 4098-1191 PT Time Calculation (min): 17 min  PT Assessment / Plan / Recommendation  History of Present Illness LTKA on 03/04/13. Pt has H/O Parkinson's Disease. On Sinemet 5x day.   Clinical Impression Pt is stiff after sitting in recliner. Has had his sinemet. Anticipate progress may be slower due to Parkinson's.   PT Comments     Follow Up Recommendations  Home health PT     Does the patient have the potential to tolerate intense rehabilitation     Barriers to Discharge        Equipment Recommendations  Rolling walker with 5" wheels    Recommendations for Other Services    Frequency 7X/week   Progress towards PT Goals Progress towards PT goals: Progressing toward goals  Plan Current plan remains appropriate    Precautions / Restrictions Precautions Precautions: Fall;Knee Required Braces or Orthoses: Knee Immobilizer - Left Knee Immobilizer - Left: Discontinue once straight leg raise with < 10 degree lag   Pertinent Vitals/Pain Min. Pain.     Mobility  Bed Mobility Bed Mobility: Sit to Supine Supine to Sit: 1: +2 Total assist;With rails;HOB elevated Supine to Sit: Patient Percentage: 50% Sitting - Scoot to Edge of Bed: 2: Max assist Sit to Supine: 3: Mod assist Details for Bed Mobility Assistance: assist getting legs onto bed. Transfers Transfers: Stand Pivot Transfers Sit to Stand: 1: +2 Total assist;From bed;With upper extremity assist;From chair/3-in-1;With armrests Sit to Stand: Patient Percentage: 60% Stand to Sit: 1: +2 Total assist;With upper extremity assist;To chair/3-in-1;To bed Stand to Sit: Patient Percentage: 60% Stand Pivot Transfers: 1: +2 Total assist Stand Pivot Transfers: Patient Percentage: 70% Details for Transfer Assistance: place LLE forward., and push from reclioner. extra time to forward flex in recliner prior to  standing. Much stiffness in trunk to lean forward. Ambulation/Gait Ambulation/Gait Assistance: 1: +2 Total assist Ambulation/Gait: Patient Percentage: 60% Ambulation Distance (Feet): 30 Feet Assistive device: Rolling walker Ambulation/Gait Assistance Details: several times pt stopped to regrip the RW. Cues for sequence and step length. Gait Pattern: Step-to pattern;Decreased step length - right;Decreased step length - left;Decreased stance time - right;Decreased stance time - left;Festinating;Decreased hip/knee flexion - right;Trunk flexed;Shuffle Gait velocity: decreased.    Exercises     PT Diagnosis: Difficulty walking;Abnormality of gait;Acute pain  PT Problem List: Decreased strength;Decreased balance;Decreased mobility;Decreased coordination;Impaired tone;Decreased knowledge of precautions;Decreased knowledge of use of DME;Decreased safety awareness;Pain PT Treatment Interventions: DME instruction;Gait training;Stair training;Functional mobility training;Therapeutic activities;Therapeutic exercise;Patient/family education   PT Goals (current goals can now be found in the care plan section) Acute Rehab PT Goals Patient Stated Goal: I want to walk  PT Goal Formulation: With patient/family Time For Goal Achievement: 03/11/13 Potential to Achieve Goals: Good  Visit Information  Last PT Received On: 03/04/13 Assistance Needed: +2 History of Present Illness: LTKA on 03/04/13. Pt has H/O Parkinson's Disease. On Sinemet 5x day.    Subjective Data  Patient Stated Goal: I want to walk    Cognition  Cognition Arousal/Alertness: Awake/alert Behavior During Therapy: WFL for tasks assessed/performed Overall Cognitive Status: Within Functional Limits for tasks assessed    Balance  Balance Balance Assessed: Yes Static Standing Balance Static Standing - Balance Support: Bilateral upper extremity supported Static Standing - Level of Assistance: 4: Min assist Static Standing - Comment/#  of Minutes: standing at RW  End of Session PT - End of Session Equipment Utilized During Treatment:  Left knee immobilizer Activity Tolerance: Patient tolerated treatment well Patient left: in bed;with call bell/phone within reach;with family/visitor present Nurse Communication: Mobility status CPM Left Knee CPM Left Knee: Off   GP     Rada Hay 03/04/2013, 6:41 PM

## 2013-03-04 NOTE — Preoperative (Signed)
Beta Blockers   Reason not to administer Beta Blockers:Not Applicable, not on home BB 

## 2013-03-04 NOTE — Progress Notes (Signed)
Utilization review completed.  

## 2013-03-04 NOTE — Op Note (Signed)
Pre-operative diagnosis- Osteoarthritis  Left knee(s)  Post-operative diagnosis- Osteoarthritis Left knee(s)  Procedure-  Left  Total Knee Arthroplasty  Surgeon- Gus Rankin. Kenae Lindquist, MD  Assistant- Dimitri Ped, PA-C   Anesthesia-  General EBL-* No blood loss amount entered *  Drains Hemovac  Tourniquet time-  Total Tourniquet Time Documented: Thigh (Left) - 37 minutes Total: Thigh (Left) - 37 minutes    Complications- None  Condition-PACU - hemodynamically stable.   Brief Clinical Note   James Schultz is a 62 y.o. year old male with end stage OA of his left knee with progressively worsening pain and dysfunction. He has constant pain, with activity and at rest and significant functional deficits with difficulties even with ADLs. He has had extensive non-op management including analgesics, injections of cortisone and viscosupplements, and home exercise program, but remains in significant pain with significant dysfunction. Radiographs show bone on bone arthritis medial and patellofemoral. He presents now for left Total Knee Arthroplasty.     Procedure in detail---   The patient is brought into the operating room and positioned supine on the operating table. After successful administration of  General,   a tourniquet is placed high on the  Left thigh(s) and the lower extremity is prepped and draped in the usual sterile fashion. Time out is performed by the operating team and then the  Left lower extremity is wrapped in Esmarch, knee flexed and the tourniquet inflated to 300 mmHg.       A midline incision is made with a ten blade through the subcutaneous tissue to the level of the extensor mechanism. A fresh blade is used to make a medial parapatellar arthrotomy. Soft tissue over the proximal medial tibia is subperiosteally elevated to the joint line with a knife and into the semimembranosus bursa with a Cobb elevator. Soft tissue over the proximal lateral tibia is elevated with attention  being paid to avoiding the patellar tendon on the tibial tubercle. The patella is everted, knee flexed 90 degrees and the ACL and PCL are removed. Findings are bone on bone medial and patellofemoral with large medial osteophytes        The drill is used to create a starting hole in the distal femur and the canal is thoroughly irrigated with sterile saline to remove the fatty contents. The 5 degree Left  valgus alignment guide is placed into the femoral canal and the distal femoral cutting block is pinned to remove 10 mm off the distal femur. Resection is made with an oscillating saw.      The tibia is subluxed forward and the menisci are removed. The extramedullary alignment guide is placed referencing proximally at the medial aspect of the tibial tubercle and distally along the second metatarsal axis and tibial crest. The block is pinned to remove 2mm off the more deficient medial  side. Resection is made with an oscillating saw. Size 4is the most appropriate size for the tibia and the proximal tibia is prepared with the modular drill and keel punch for that size.      The femoral sizing guide is placed and size 4 is most appropriate. Rotation is marked off the epicondylar axis and confirmed by creating a rectangular flexion gap at 90 degrees. The size 4 cutting block is pinned in this rotation and the anterior, posterior and chamfer cuts are made with the oscillating saw. The intercondylar block is then placed and that cut is made.      Trial size 4 tibial component, trial  size 4 posterior stabilized femur and a 10  mm posterior stabilized rotating platform insert trial is placed. Full extension is achieved with excellent varus/valgus and anterior/posterior balance throughout full range of motion. The patella is everted and thickness measured to be 27  mm. Free hand resection is taken to 15 mm, a 41 template is placed, lug holes are drilled, trial patella is placed, and it tracks normally. Osteophytes are  removed off the posterior femur with the trial in place. All trials are removed and the cut bone surfaces prepared with pulsatile lavage. Cement is mixed and once ready for implantation, the size 4 tibial implant, size  4 posterior stabilized femoral component, and the size 41 patella are cemented in place and the patella is held with the clamp. The trial insert is placed and the knee held in full extension. The Exparel (20 ml mixed with 30 ml saline) and .25% Bupivicaine, are injected into the extensor mechanism, posterior capsule, medial and lateral gutters and subcutaneous tissues.  All extruded cement is removed and once the cement is hard the permanent 10 mm posterior stabilized rotating platform insert is placed into the tibial tray.      The wound is copiously irrigated with saline solution and the extensor mechanism closed over a hemovac drain with #1 PDS suture. The tourniquet is released for a total tourniquet time of 37  minutes. Flexion against gravity is 140 degrees and the patella tracks normally. Subcutaneous tissue is closed with 2.0 vicryl and subcuticular with running 4.0 Monocryl. The incision is cleaned and dried and steri-strips and a bulky sterile dressing are applied. The limb is placed into a knee immobilizer and the patient is awakened and transported to recovery in stable condition.      Please note that a surgical assistant was a medical necessity for this procedure in order to perform it in a safe and expeditious manner. Surgical assistant was necessary to retract the ligaments and vital neurovascular structures to prevent injury to them and also necessary for proper positioning of the limb to allow for anatomic placement of the prosthesis.   Gus Rankin Ona Roehrs, MD    03/04/2013, 10:30 AM

## 2013-03-05 ENCOUNTER — Encounter (HOSPITAL_COMMUNITY): Payer: Self-pay | Admitting: Orthopedic Surgery

## 2013-03-05 DIAGNOSIS — I10 Essential (primary) hypertension: Secondary | ICD-10-CM

## 2013-03-05 DIAGNOSIS — G4733 Obstructive sleep apnea (adult) (pediatric): Secondary | ICD-10-CM

## 2013-03-05 DIAGNOSIS — G20 Parkinson's disease: Secondary | ICD-10-CM | POA: Diagnosis present

## 2013-03-05 LAB — BASIC METABOLIC PANEL
BUN: 11 mg/dL (ref 6–23)
CO2: 27 mEq/L (ref 19–32)
Chloride: 104 mEq/L (ref 96–112)
Creatinine, Ser: 0.68 mg/dL (ref 0.50–1.35)
Potassium: 3.9 mEq/L (ref 3.5–5.1)

## 2013-03-05 LAB — CBC
HCT: 33.9 % — ABNORMAL LOW (ref 39.0–52.0)
MCV: 85 fL (ref 78.0–100.0)
Platelets: 188 10*3/uL (ref 150–400)
RBC: 3.99 MIL/uL — ABNORMAL LOW (ref 4.22–5.81)
WBC: 15.2 10*3/uL — ABNORMAL HIGH (ref 4.0–10.5)

## 2013-03-05 MED ORDER — BLISTEX EX OINT
TOPICAL_OINTMENT | CUTANEOUS | Status: AC
  Administered 2013-03-05: 1
  Filled 2013-03-05: qty 10

## 2013-03-05 NOTE — Evaluation (Signed)
Occupational Therapy Evaluation Patient Details Name: James Schultz MRN: [id number] DOB: [birth year] Today's Date: 03/05/2013 Time: 7829-5621 OT Time Calculation (min): 27 min  OT Assessment / Plan / Recommendation History of present illness LTKA on 03/04/13. Pt has H/O Parkinson's Disease. On Sinemet 5x day.   Clinical Impression   This 62 year old man was admitted for L TKA.  He has a h/o Parksinson's Disease and was mod I with adls.  Wife has a h/o back surgery and cannot lift but can help with adls.  Pt is appropriate for skilled OT to increase independence with adls.    OT Assessment  Patient needs continued OT Services    Follow Up Recommendations  Home health OT    Barriers to Discharge      Equipment Recommendations  None recommended by OT    Recommendations for Other Services    Frequency  Min 2X/week    Precautions / Restrictions Precautions Precautions: Fall;Knee Required Braces or Orthoses: Knee Immobilizer - Left Knee Immobilizer - Left: Discontinue once straight leg raise with < 10 degree lag Restrictions Weight Bearing Restrictions: No   Pertinent Vitals/Pain Pain in thigh where turnique was--not rated.  Repositioned and ice applied    ADL  Eating/Feeding: Set up Where Assessed - Eating/Feeding: Chair Grooming: Set up Where Assessed - Grooming: Supported sitting Upper Body Bathing: Minimal assistance Where Assessed - Upper Body Bathing: Supported sitting Lower Body Bathing: Moderate assistance Where Assessed - Lower Body Bathing: Supported sit to stand Upper Body Dressing: Moderate assistance (wife assisted). Where Assessed - Upper Body Dressing: Supported sit to stand Lower Body Dressing: Maximal assistance Where Assessed - Lower Body Dressing: Supported sit to stand Toilet Transfer: Minimal assistance Toilet Transfer Method: Sit to Barista: Raised toilet seat with arms (or 3-in-1 over toilet) Toileting - Clothing  Manipulation and Hygiene: Moderate assistance Where Assessed - Toileting Clothing Manipulation and Hygiene: Sit to stand from 3-in-1 or toilet Equipment Used: Rolling walker Transfers/Ambulation Related to ADLs: pt assisting with lifting leg during bed mobility.  Takes time for him to advance legs due to Parkinson's.  Cues for walker distance and sequence ADL Comments: Wife will assist with adls but she cannot lift due to back sx/procedures    OT Diagnosis: Generalized weakness  OT Problem List: Decreased strength;Decreased activity tolerance;Decreased knowledge of use of DME or AE;Pain OT Treatment Interventions: Self-care/ADL training;Energy conservation;Therapeutic activities;Patient/family education   OT Goals(Current goals can be found in the care plan section) Acute Rehab OT Goals OT Goal Formulation: With patient Potential to Achieve Goals: Good ADL Goals Pt Will Perform Grooming: with supervision;standing Pt Will Transfer to Toilet: with min guard assist;ambulating (with min cues for sequence and walker distance) Pt Will Perform Tub/Shower Transfer: with min assist;Shower transfer;ambulating;shower seat Additional ADL Goal #1: pt will independently manage LLE during bed mobility and sit<>stand for adls  Visit Information  Last OT Received On: 08/05/13 Assistance Needed: +1 PT/OT Co-Evaluation/Treatment: Yes History of Present Illness: LTKA on 03/04/13. Pt has H/O Parkinson's Disease. On Sinemet 5x day.       Prior Functioning     Home Living Family/patient expects to be discharged to:: Private residence Living Arrangements: Spouse/significant other Home Equipment: Hospital bed (can borrow shower seat) Additional Comments: wife has h/o back sx.  Can't lift Prior Function Level of Independence: Independent with assistive device(s) Communication Communication: No difficulties         Vision/Perception     Cognition  Cognition Arousal/Alertness:  Awake/alert  Behavior During Therapy: WFL for tasks assessed/performed Overall Cognitive Status: Within Functional Limits for tasks assessed    Extremity/Trunk Assessment Upper Extremity Assessment Upper Extremity Assessment: RUE deficits/detail;LUE deficits/detail RUE Deficits / Details: pt able to move arms against gravity and has adequate strength for functional tasks.  Has tremor (parkinsons) and tends to reposition hands on walker (straddles between 2nd and 3rd digits) when arms fatiqued LUE Deficits / Details: same     Mobility Bed Mobility Supine to Sit: 4: Min assist;HOB elevated;With rails (trapeze.  Pt will have this at home) Transfers Sit to Stand: 4: Min assist;From elevated surface;From bed;From chair/3-in-1;With upper extremity assist Stand to Sit: 4: Min assist Details for Transfer Assistance: used rails on 3:1.  Assist to slide LLE forward, pt did so back to recliner     Exercise     Balance Static Standing Balance Static Standing - Balance Support: Bilateral upper extremity supported Static Standing - Level of Assistance: 5: Stand by assistance Static Standing - Comment/# of Minutes: 1 minute   End of Session OT - End of Session Activity Tolerance: Patient tolerated treatment well Patient left: in chair;with call bell/phone within reach;with family/visitor present CPM Left Knee CPM Left Knee: Off  GO     James Schultz 03/05/2013, 9:46 AM Marica Otter, OTR/L [phone] 03/05/2013

## 2013-03-05 NOTE — Progress Notes (Signed)
Nutrition Education Note  RD drawn to chart secondary to current prescription of Azilect, a medication with potential interactions with high tyramine-containing foods.  RD provided "Tyramine-Restricted Nutrition Therapy" handout from the [employer] and discussed importance of this restriction while taking current medication. Reviewed list of foods to avoid. Teach back method used. Pt reports having previously received education regarding foods to avoid while on Azilect.  Expect good compliance.  Body mass index is 35.01 kg/(m^2). Pt meets criteria for Obese based on current BMI.  Current diet order is Regurlar, patient is consuming approximately 100% of meals at this time. Pt reports that he was eating well PTA and has recently started eating healthier in order to lose weight. Pt has no further questions or concerns at this time. Additional labs and medications reviewed.   RD will change current diet to Low Tyramine Diet to prevent potential food-medication interaction.   No further nutrition interventions warranted at this time. RD contact information provided. If additional nutrition issues arise, please re-consult RD.  Ian Malkin RD, LDN Inpatient Clinical Dietitian Pager: [phone] After Hours Pager: [phone]

## 2013-03-05 NOTE — Progress Notes (Signed)
Physical Therapy Treatment Patient Details Name: James Schultz MRN: [id number] DOB: Nov 15, 1950 Today's Date: 03/05/2013 Time: 4782-9562 PT Time Calculation (min): 26 min  PT Assessment / Plan / Recommendation  History of Present Illness     Clinical Impression    PT Comments   Pt continues to improve in ambulation, is fairly steady but will require 1 person to walk with pt at Dc. Movements slow. Plans to DC tomorrow. Will need a RW.   Follow Up Recommendations  Home health PT     Does the patient have the potential to tolerate intense rehabilitation     Barriers to Discharge        Equipment Recommendations  Rolling walker with 5" wheels    Recommendations for Other Services    Frequency 7X/week   Progress towards PT Goals Progress towards PT goals: Progressing toward goals  Plan Current plan remains appropriate    Precautions / Restrictions Precautions Precautions: Fall;Knee Required Braces or Orthoses: Knee Immobilizer - Left Knee Immobilizer - Left: Discontinue once straight leg raise with < 10 degree lag   Pertinent Vitals/Pain Thigh is most sore.    Mobility  BSit to Supine: 4: Min guard Details for Bed Mobility Assistance: pt able to get Legs onto bed with no assistance. Transfers Sit to Stand: 4: Min guard;From chair/3-in-1 Stand to Sit: To bed;4: Min guard Details for Transfer Assistance: extra time to get trunk flexed to get forward in recliner prior to standing up. Ambulation/Gait Ambulation/Gait Assistance: 4: Min assist;3: Mod assist Ambulation Distance (Feet): 50 Feet Assistive device: Rolling walker Ambulation/Gait Assistance Details: cues for position inside RW, at times, too far forward. Extra time for resting hands from RW. Pt can stand and balance and let go of RW briefly. Gait Pattern: Step-to pattern;Decreased step length - right;Decreased step length - left;Decreased stance time - right;Decreased stance time - left;Festinating;Decreased  hip/knee flexion - right;Trunk flexed;Shuffle Gait velocity: decreased.    Exercises Total Joint Exercises Quad Sets: AROM;Both;10 reps Short Arc Quad: AROM;Left;10 reps Heel Slides: AAROM;Both;10 reps Hip ABduction/ADduction: AROM;Both;10 reps Straight Leg Raises: AAROM;Both;10 reps Goniometric ROM: 10-45 l knee   PT Diagnosis:    PT Problem List:   PT Treatment Interventions:     PT Goals (current goals can now be found in the care plan section)    Visit Information  Last PT Received On: 03/05/13 Assistance Needed: +1    Subjective Data      Cognition  Cognition Arousal/Alertness: Awake/alert    Balance     End of Session PT - End of Session Equipment Utilized During Treatment: Left knee immobilizer;Gait belt Activity Tolerance: Patient tolerated treatment well Patient left: with call bell/phone within reach;with family/visitor present;in bed Nurse Communication: Mobility status   GP     Rada Hay 03/05/2013, 3:34 PM

## 2013-03-05 NOTE — Progress Notes (Signed)
Physical Therapy Treatment Patient Details Name: James Schultz MRN: [id number] DOB: [birth year] Today's Date: 03/05/2013 Time: 4132-4401 PT Time Calculation (min): 45 min  PT Assessment / Plan / Recommendation  History of Present Illness     Clinical Impression    PT Comments   Pt improving in ambulation, able to make turns and back up much better.  Follow Up Recommendations  Home health PT     Does the patient have the potential to tolerate intense rehabilitation     Barriers to Discharge        Equipment Recommendations  Rolling walker with 5" wheels    Recommendations for Other Services    Frequency 7X/week   Progress towards PT Goals Progress towards PT goals: Progressing toward goals  Plan Current plan remains appropriate    Precautions / Restrictions Precautions Precautions: Fall;Knee Required Braces or Orthoses: Knee Immobilizer - Left Knee Immobilizer - Left: Discontinue once straight leg raise with < 10 degree lag   Pertinent Vitals/Pain Reports thigh is sore.    Mobility  Bed Mobility Supine to Sit: 4: Min assist;HOB elevated;With rails Sitting - Scoot to Edge of Bed: 3: Mod assist Details for Bed Mobility Assistance: extra time for getting to upright position, increased tone. Transfers Sit to Stand: 4: Min assist;From elevated surface;From bed;From chair/3-in-1;With upper extremity assist Stand to Sit: 4: Min assist;To chair/3-in-1;With upper extremity assist Details for Transfer Assistance: used rails on 3:1.  Assist to slide LLE forward, pt did so back to recliner, extra time for flexing trunk to sit down. Ambulation/Gait Ambulation/Gait Assistance: 4: Min assist Ambulation Distance (Feet): 25 Feet (x2) Assistive device: Rolling walker Ambulation/Gait Assistance Details: extra time for walking, releases hands to relax  at times. cues to keep closer inside the RW Gait Pattern: Step-to pattern;Decreased step length - right;Decreased step length -  left;Decreased stance time - right;Decreased stance time - left;Festinating;Decreased hip/knee flexion - right;Trunk flexed;Shuffle Gait velocity: decreased.    Exercises Total Joint Exercises Quad Sets: AROM;Both;10 reps Short Arc Quad: AROM;Left;10 reps Heel Slides: AAROM;Both;10 reps Hip ABduction/ADduction: AROM;Both;10 reps Straight Leg Raises: AAROM;Both;10 reps Goniometric ROM: 10-45 l knee   PT Diagnosis:    PT Problem List:   PT Treatment Interventions:     PT Goals (current goals can now be found in the care plan section)    Visit Information  Last PT Received On: 03/05/13 Assistance Needed: +1    Subjective Data      Cognition  Cognition Arousal/Alertness: Awake/alert    Balance     End of Session PT - End of Session Equipment Utilized During Treatment: Left knee immobilizer Activity Tolerance: Patient tolerated treatment well Patient left: with call bell/phone within reach;with family/visitor present;in chair Nurse Communication: Mobility status   GP     Rada Hay 03/05/2013, 3:28 PM

## 2013-03-05 NOTE — Progress Notes (Signed)
   Subjective: 1 Day Post-Op Procedure(s) (LRB): LEFT TOTAL KNEE ARTHROPLASTY (Left) Patient reports pain as mild.   Patient seen in rounds with Dr. Lequita Halt.  Family in room with patient.  Already [employer]. Patient is well, but has had some minor complaints of pain in the knee, requiring pain medications We will start therapy today.  Plan is to go Home after hospital stay.  Objective: Vital signs in last 24 hours: Temp:  [97.3 F (36.3 C)-99 F (37.2 C)] 98.4 F (36.9 C) (07/22 0634) Pulse Rate:  [73-109] 89 (07/22 0634) Resp:  [8-16] 16 (07/22 0634) BP: (118-156)/(68-87) 128/71 mmHg (07/22 0634) SpO2:  [95 %-100 %] 99 % (07/22 0634) Weight:  [110.678 kg (244 lb)] 110.678 kg (244 lb) (07/21 1212)  Intake/Output from previous day:  Intake/Output Summary (Last 24 hours) at 03/05/13 0823 Last data filed at 03/05/13 0700  Gross per 24 hour  Intake 4668.33 ml  Output   1655 ml  Net 3013.33 ml    Intake/Output this shift: UOP 450 since MN +3013  Labs:  Recent Labs  03/05/13 0407  HGB 11.5*    Recent Labs  03/05/13 0407  WBC 15.2*  RBC 3.99*  HCT 33.9*  PLT 188    Recent Labs  03/05/13 0407  NA 139  K 3.9  CL 104  CO2 27  BUN 11  CREATININE 0.68  GLUCOSE 156*  CALCIUM 8.7   No results found for this basename: LABPT, INR,  in the last 72 hours  EXAM General - Patient is Alert, Appropriate and Oriented Extremity - Neurovascular intact Sensation intact distally Dorsiflexion/Plantar flexion intact No cellulitis present Dressing - dressing C/D/I Motor Function - intact, moving foot and toes well on exam.  Hemovac pulled without difficulty.  Past Medical History  Diagnosis Date  . Hypertension   . Anxiety   . Depression   . Parkinson's disease     lov note dr love 10-25-2011, lov note crae everywhere 02-19-2013 dr Azucena Cecil scott  . Headache(784.0)   . Overactive bladder   . Overactive bladder   . Spinal stenosis of lumbar region   . Dysrhythmia    . Sleep apnea     cpap, setting of 12, uses [place] prongs no mask used    Assessment/Plan: 1 Day Post-Op Procedure(s) (LRB): LEFT TOTAL KNEE ARTHROPLASTY (Left) Principal Problem:   OA (osteoarthritis) of knee Active Problems:   Unspecified essential hypertension - Lisinopril resumed, parameter placed.   Unspecified sleep apnea - resumed CPAP   Parkinson's disease - resumed home meds.  Estimated body mass index is 35.01 kg/(m^2) as calculated from the following:   Height as of this encounter: 5\' 10"  (1.778 m).   Weight as of this encounter: 110.678 kg (244 lb). Advance diet Up with therapy Discharge home with home health  DVT Prophylaxis - Xarelto Weight-Bearing as tolerated to left leg No vaccines. D/C O2 and Pulse OX and try on Room [address]  Patrica Duel 03/05/2013, 8:23 AM

## 2013-03-05 NOTE — Progress Notes (Signed)
Spoke with pt's wife at bedside concerning HH needs, and Agency. She selected [facility] in [place], [place] for [place] [phone], [phone] fax.  [facility] for HHPT, referral given to in house rep.

## 2013-03-06 LAB — CBC
Hemoglobin: 11 g/dL — ABNORMAL LOW (ref 13.0–17.0)
Platelets: 157 10*3/uL (ref 150–400)
RBC: 3.85 MIL/uL — ABNORMAL LOW (ref 4.22–5.81)
WBC: 14.2 10*3/uL — ABNORMAL HIGH (ref 4.0–10.5)

## 2013-03-06 LAB — BASIC METABOLIC PANEL
CO2: 27 mEq/L (ref 19–32)
Calcium: 8.8 mg/dL (ref 8.4–10.5)
Chloride: 103 mEq/L (ref 96–112)
Potassium: 3.9 mEq/L (ref 3.5–5.1)
Sodium: 138 mEq/L (ref 135–145)

## 2013-03-06 MED ORDER — BISACODYL 10 MG RE SUPP: 10.0000 mg | Freq: Every day | RECTAL | Status: DC | PRN

## 2013-03-06 MED ORDER — OXYCODONE HCL 5 MG PO TABS: 5.0000 mg | ORAL_TABLET | ORAL | Status: DC | PRN

## 2013-03-06 MED ORDER — METHOCARBAMOL 500 MG PO TABS: 500.0000 mg | ORAL_TABLET | Freq: Four times a day (QID) | ORAL | Status: DC | PRN

## 2013-03-06 MED ORDER — RIVAROXABAN 10 MG PO TABS: 10.0000 mg | ORAL_TABLET | Freq: Every day | ORAL | Status: DC

## 2013-03-06 NOTE — Discharge Summary (Signed)
Physician Discharge Summary   Patient ID: James Schultz MRN: [id number] DOB/AGE: 05/06/1951 62 y.o.  Admit date: 03/04/2013 Discharge date: 03/06/2013  Primary Diagnosis:  Osteoarthritis Left knee  Admission Diagnoses:  Past Medical History  Diagnosis Date  . Hypertension   . Anxiety   . Depression   . Parkinson's disease     lov note dr love 10-25-2011, lov note crae everywhere 02-19-2013 dr Azucena Cecil scott  . Headache(784.0)   . Overactive bladder   . Overactive bladder   . Spinal stenosis of lumbar region   . Dysrhythmia   . Sleep apnea     cpap, setting of 12, uses [place] prongs no mask used   Discharge Diagnoses:   Principal Problem:   OA (osteoarthritis) of knee Active Problems:   Unspecified essential hypertension   Unspecified sleep apnea   Parkinson's disease  Estimated body mass index is 35.01 kg/(m^2) as calculated from the following:   Height as of this encounter: 5\' 10"  (1.778 m).   Weight as of this encounter: 110.678 kg (244 lb).  Procedure:  Procedure(s) (LRB): LEFT TOTAL KNEE ARTHROPLASTY (Left)   Consults: None  HPI: James Schultz is a 62 y.o. year old male with end stage OA of his left knee with progressively worsening pain and dysfunction. He has constant pain, with activity and at rest and significant functional deficits with difficulties even with ADLs. He has had extensive non-op management including analgesics, injections of cortisone and viscosupplements, and home exercise program, but remains in significant pain with significant dysfunction. Radiographs show bone on bone arthritis medial and patellofemoral. He presents now for left Total Knee Arthroplasty.   Laboratory Data: Admission on 03/04/2013  Component Date Value Range Status  . ABO/RH(D) 03/04/2013 A NEG   Final  . WBC 03/05/2013 15.2* 4.0 - 10.5 K/uL Final  . RBC 03/05/2013 3.99* 4.22 - 5.81 MIL/uL Final  . Hemoglobin 03/05/2013 11.5* 13.0 - 17.0 g/dL Final  . HCT 56/21/3086 33.9* 39.0  - 52.0 % Final  . MCV 03/05/2013 85.0  78.0 - 100.0 fL Final  . MCH 03/05/2013 28.8  26.0 - 34.0 pg Final  . MCHC 03/05/2013 33.9  30.0 - 36.0 g/dL Final  . RDW 57/84/6962 12.6  11.5 - 15.5 % Final  . Platelets 03/05/2013 188  150 - 400 K/uL Final  . Sodium 03/05/2013 139  135 - 145 mEq/L Final  . Potassium 03/05/2013 3.9  3.5 - 5.1 mEq/L Final  . Chloride 03/05/2013 104  96 - 112 mEq/L Final  . CO2 03/05/2013 27  19 - 32 mEq/L Final  . Glucose, Bld 03/05/2013 156* 70 - 99 mg/dL Final  . BUN 95/28/4132 11  6 - 23 mg/dL Final  . Creatinine, Ser 03/05/2013 0.68  0.50 - 1.35 mg/dL Final  . Calcium 44/08/270 8.7  8.4 - 10.5 mg/dL Final  . GFR calc non Af Amer 03/05/2013 >90  >90 mL/min Final  . GFR calc Af Amer 03/05/2013 >90  >90 mL/min Final   Comment:                                 The eGFR has been calculated                          using the CKD EPI equation.  This calculation has not been                          validated in all clinical                          situations.                          eGFR's persistently                          <90 mL/min signify                          possible Chronic Kidney Disease.  . WBC 03/06/2013 14.2* 4.0 - 10.5 K/uL Final  . RBC 03/06/2013 3.85* 4.22 - 5.81 MIL/uL Final  . Hemoglobin 03/06/2013 11.0* 13.0 - 17.0 g/dL Final  . HCT 96/11/5407 32.7* 39.0 - 52.0 % Final  . MCV 03/06/2013 84.9  78.0 - 100.0 fL Final  . MCH 03/06/2013 28.6  26.0 - 34.0 pg Final  . MCHC 03/06/2013 33.6  30.0 - 36.0 g/dL Final  . RDW 81/19/1478 13.0  11.5 - 15.5 % Final  . Platelets 03/06/2013 157  150 - 400 K/uL Final  . Sodium 03/06/2013 138  135 - 145 mEq/L Final  . Potassium 03/06/2013 3.9  3.5 - 5.1 mEq/L Final  . Chloride 03/06/2013 103  96 - 112 mEq/L Final  . CO2 03/06/2013 27  19 - 32 mEq/L Final  . Glucose, Bld 03/06/2013 119* 70 - 99 mg/dL Final  . BUN 29/56/2130 11  6 - 23 mg/dL Final  . Creatinine, Ser 03/06/2013 0.59   0.50 - 1.35 mg/dL Final  . Calcium 86/57/8469 8.8  8.4 - 10.5 mg/dL Final  . GFR calc non Af Amer 03/06/2013 >90  >90 mL/min Final  . GFR calc Af Amer 03/06/2013 >90  >90 mL/min Final   Comment:                                 The eGFR has been calculated                          using the CKD EPI equation.                          This calculation has not been                          validated in all clinical                          situations.                          eGFR's persistently                          <90 mL/min signify                          possible Chronic Kidney Disease.  Hospital Outpatient Visit on 02/27/2013  Component Date Value Range Status  . aPTT 02/27/2013 27  24 - 37 seconds Final  . WBC 02/27/2013 7.2  4.0 - 10.5 K/uL Final   ADJUSTED FOR NUCLEATED RBC'S  . RBC 02/27/2013 4.84  4.22 - 5.81 MIL/uL Final  . Hemoglobin 02/27/2013 13.9  13.0 - 17.0 g/dL Final  . HCT 56/21/3086 41.5  39.0 - 52.0 % Final  . MCV 02/27/2013 85.7  78.0 - 100.0 fL Final  . MCH 02/27/2013 28.7  26.0 - 34.0 pg Final  . MCHC 02/27/2013 33.5  30.0 - 36.0 g/dL Final  . RDW 57/84/6962 13.1  11.5 - 15.5 % Final  . Platelets 02/27/2013 209  150 - 400 K/uL Final  . Sodium 02/27/2013 140  135 - 145 mEq/L Final  . Potassium 02/27/2013 4.4  3.5 - 5.1 mEq/L Final  . Chloride 02/27/2013 101  96 - 112 mEq/L Final  . CO2 02/27/2013 30  19 - 32 mEq/L Final  . Glucose, Bld 02/27/2013 100* 70 - 99 mg/dL Final  . BUN 95/28/4132 15  6 - 23 mg/dL Final  . Creatinine, Ser 02/27/2013 0.79  0.50 - 1.35 mg/dL Final  . Calcium 44/08/270 9.8  8.4 - 10.5 mg/dL Final  . Total Protein 02/27/2013 7.5  6.0 - 8.3 g/dL Final  . Albumin 53/66/4403 4.2  3.5 - 5.2 g/dL Final  . AST 47/42/5956 20  0 - 37 U/L Final  . ALT 02/27/2013 5  0 - 53 U/L Final  . Alkaline Phosphatase 02/27/2013 61  39 - 117 U/L Final  . Total Bilirubin 02/27/2013 0.4  0.3 - 1.2 mg/dL Final  . GFR calc non Af Amer 02/27/2013 >90  >90  mL/min Final  . GFR calc Af Amer 02/27/2013 >90  >90 mL/min Final   Comment:                                 The eGFR has been calculated                          using the CKD EPI equation.                          This calculation has not been                          validated in all clinical                          situations.                          eGFR's persistently                          <90 mL/min signify                          possible Chronic Kidney Disease.  Marland Kitchen Prothrombin Time 02/27/2013 12.7  11.6 - 15.2 seconds Final  . INR 02/27/2013 0.97  0.00 - 1.49 Final  . ABO/RH(D) 02/27/2013 A NEG   Final  . Antibody Screen 02/27/2013 NEG   Final  . Sample Expiration 02/27/2013 03/07/2013   Final  . Color, Urine 02/27/2013 YELLOW  YELLOW Final  . APPearance 02/27/2013 CLEAR  CLEAR Final  . Specific Gravity, Urine 02/27/2013 1.029  1.005 - 1.030 Final  . pH 02/27/2013 6.5  5.0 - 8.0 Final  . Glucose, UA 02/27/2013 NEGATIVE  NEGATIVE mg/dL Final  . Hgb urine dipstick 02/27/2013 NEGATIVE  NEGATIVE Final  . Bilirubin Urine 02/27/2013 NEGATIVE  NEGATIVE Final  . Ketones, ur 02/27/2013 NEGATIVE  NEGATIVE mg/dL Final  . Protein, ur 16/05/9603 NEGATIVE  NEGATIVE mg/dL Final  . Urobilinogen, UA 02/27/2013 0.2  0.0 - 1.0 mg/dL Final  . Nitrite 54/04/8118 NEGATIVE  NEGATIVE Final  . Leukocytes, UA 02/27/2013 NEGATIVE  NEGATIVE Final   MICROSCOPIC NOT DONE ON URINES WITH NEGATIVE PROTEIN, BLOOD, LEUKOCYTES, NITRITE, OR GLUCOSE <1000 mg/dL.  Marland Kitchen MRSA, PCR 02/27/2013 NEGATIVE  NEGATIVE Final  . Staphylococcus aureus 02/27/2013 POSITIVE* NEGATIVE Final   Comment:                                 The Xpert SA Assay (FDA                          approved for NASAL specimens                          in patients over 67 years of age),                          is one component of                          a comprehensive surveillance                          program.  Test performance has                           been validated by [employer] for patients greater                          than or equal to 40 year old.                          It is not intended                          to diagnose infection nor to                          guide or monitor treatment.  . ABO/RH(D) 02/27/2013 A NEG   Final     X-Rays:Dg Chest 2 View  02/27/2013   *RADIOLOGY REPORT*  Clinical Data: Preoperative respiratory exam for left knee arthroplasty.  CHEST - 2 VIEW  Comparison: 12/29/2009  Findings: Stable calcified granuloma of the left upper lobe.  No edema, infiltrate or pleural fluid is seen.  The heart size is normal.  Bony thorax shows stable mild degenerative changes of the thoracic spine.  IMPRESSION: No active disease.  Stable left upper lobe granuloma.  Original Report Authenticated By: Irish Lack, M.D.    EKG: Orders placed during the hospital encounter of 02/27/13  . EKG 12-LEAD  . EKG 12-LEAD     Hospital Course: James Schultz is a 62 y.o. who was admitted to [facility]. They were brought to the operating room on 03/04/2013 and underwent Procedure(s): LEFT TOTAL KNEE ARTHROPLASTY.  Patient tolerated the procedure well and was later transferred to the recovery room and then to the orthopaedic floor for postoperative care.  They were given PO and IV analgesics for pain control following their surgery.  They were given 24 hours of postoperative antibiotics of  Anti-infectives   Start     Dose/Rate Route Frequency Ordered Stop   03/04/13 1530  ceFAZolin (ANCEF) IVPB 2 g/50 mL premix     2 g 100 mL/hr over 30 Minutes Intravenous Every 6 hours 03/04/13 1217 03/04/13 2202   03/04/13 0700  ceFAZolin (ANCEF) IVPB 2 g/50 mL premix    Comments:  Dose decreased to 2g per P&T policy for weight < 120kg.   2 g 100 mL/hr over 30 Minutes Intravenous On call to O.R. 03/04/13 1610 03/04/13 0916     and started on DVT prophylaxis in the form of Xarelto.    PT and OT were ordered for total joint protocol.  Discharge planning consulted to help with postop disposition and equipment needs.  Patient had a good night on the evening of surgery.  They started to get up OOB with therapy on day one and was already doing SLR's. Hemovac drain was pulled without difficulty.  Continued to work with therapy into day two.  Dressing was changed on day two and the incision was healing well.   Patient was seen in rounds and was ready to go home later that same day.   Discharge Medications: Prior to Admission medications   Medication Sig Start Date End Date Taking? Authorizing Provider  buPROPion (WELLBUTRIN SR) 150 MG 12 hr tablet Take 150 mg by mouth 2 (two) times daily.   Yes Historical Provider, MD  carbidopa-levodopa (SINEMET IR) 25-100 MG per tablet Take 1 tablet by mouth 5 (five) times daily.   Yes Historical Provider, MD  lisinopril (PRINIVIL,ZESTRIL) 10 MG tablet Take 10 mg by mouth every morning.   Yes Historical Provider, MD  LORazepam (ATIVAN) 1 MG tablet Take 1 mg by mouth 4 (four) times daily as needed for anxiety.   Yes Historical Provider, MD  pramipexole (MIRAPEX) 1 MG tablet Take 0.5-1 mg by mouth 3 (three) times daily. Takes 1 tablet in the morning 6am and half tablet at 10 am and half tablet at 2 pm   Yes Historical Provider, MD  rasagiline (AZILECT) 1 MG TABS Take 1 mg by mouth every morning.    Yes Historical Provider, MD  bisacodyl (DULCOLAX) 10 MG suppository Place 1 suppository (10 mg total) rectally daily as needed. 03/06/13   Alexzandrew Julien Girt, PA-C  methocarbamol (ROBAXIN) 500 MG tablet Take 1 tablet (500 mg total) by mouth every 6 (six) hours as needed. 03/06/13   Alexzandrew Perkins, PA-C  oxyCODONE (OXY IR/ROXICODONE) 5 MG immediate release tablet Take 1-2 tablets (5-10 mg total) by mouth every 3 (three) hours as needed. 03/06/13   Alexzandrew Julien Girt, PA-C  rivaroxaban (XARELTO) 10 MG TABS tablet Take 1 tablet (10 mg total) by mouth daily  with breakfast. Take Xarelto for two and a half more weeks, then discontinue Xarelto. Once the patient has completed the blood thinner regimen, then  take a Baby 81 mg Aspirin daily for four more weeks. 03/06/13   Alexzandrew Julien Girt, PA-C    Diet: Cardiac diet Activity:WBAT Follow-up:in 2 weeks Disposition - Home Discharged Condition: good   Discharge Orders   Future Orders Complete By Expires     Call MD / Call 911  As directed     Comments:      If you experience chest pain or shortness of breath, CALL 911 and be transported to the hospital emergency room.  If you develope a fever above 101 F, pus (white drainage) or increased drainage or redness at the wound, or calf pain, call your surgeon's office.    Change dressing  As directed     Comments:      Change dressing daily with sterile 4 x 4 inch gauze dressing and apply TED hose. Do not submerge the incision under water.    Constipation Prevention  As directed     Comments:      Drink plenty of fluids.  Prune juice may be helpful.  You may use a stool softener, such as Colace (over the counter) 100 mg twice a day.  Use MiraLax (over the counter) for constipation as needed.    Diet - low sodium heart healthy  As directed     Discharge instructions  As directed     Comments:      Pick up stool softner and laxative for home. Do not submerge incision under water. May shower. Continue to use ice for pain and swelling from surgery.  Take Xarelto for two and a half more weeks, then discontinue Xarelto. Once the patient has completed the blood thinner regimen, then take a Baby 81 mg Aspirin daily for four more weeks.    Do not put a pillow under the knee. Place it under the heel.  As directed     Do not sit on low chairs, stoools or toilet seats, as it may be difficult to get up from low surfaces  As directed     Driving restrictions  As directed     Comments:      No driving until released by the physician.    Increase activity slowly  as tolerated  As directed     Lifting restrictions  As directed     Comments:      No lifting until released by the physician.    Patient may shower  As directed     Comments:      You may shower without a dressing once there is no drainage.  Do not wash over the wound.  If drainage remains, do not shower until drainage stops.    TED hose  As directed     Comments:      Use stockings (TED hose) for 3 weeks on both leg(s).  You may remove them at night for sleeping.    Weight bearing as tolerated  As directed         Medication List    STOP taking these medications       oxyCODONE-acetaminophen 10-325 MG per tablet  Commonly known as:  PERCOCET      TAKE these medications       AZILECT 1 MG Tabs  Generic drug:  rasagiline  Take 1 mg by mouth every morning.     bisacodyl 10 MG suppository  Commonly known as:  DULCOLAX  Place 1 suppository (10 mg total) rectally daily as needed.     buPROPion 150  MG 12 hr tablet  Commonly known as:  WELLBUTRIN SR  Take 150 mg by mouth 2 (two) times daily.     carbidopa-levodopa 25-100 MG per tablet  Commonly known as:  SINEMET IR  Take 1 tablet by mouth 5 (five) times daily.     lisinopril 10 MG tablet  Commonly known as:  PRINIVIL,ZESTRIL  Take 10 mg by mouth every morning.     LORazepam 1 MG tablet  Commonly known as:  ATIVAN  Take 1 mg by mouth 4 (four) times daily as needed for anxiety.     methocarbamol 500 MG tablet  Commonly known as:  ROBAXIN  Take 1 tablet (500 mg total) by mouth every 6 (six) hours as needed.     oxyCODONE 5 MG immediate release tablet  Commonly known as:  Oxy IR/ROXICODONE  Take 1-2 tablets (5-10 mg total) by mouth every 3 (three) hours as needed.     pramipexole 1 MG tablet  Commonly known as:  MIRAPEX  Take 0.5-1 mg by mouth 3 (three) times daily. Takes 1 tablet in the morning 6am and half tablet at 10 am and half tablet at 2 pm     rivaroxaban 10 MG Tabs tablet  Commonly known as:  XARELTO  -  Take 1 tablet (10 mg total) by mouth daily with breakfast. Take Xarelto for two and a half more weeks, then discontinue Xarelto.  - Once the patient has completed the blood thinner regimen, then take a Baby 81 mg Aspirin daily for four more weeks.           Follow-up Information   Follow up with Loanne Drilling, MD. Schedule an appointment as soon as possible for a visit on 03/19/2013. (Call [phone] tomorrow to make the appointment)    Contact information:   [address] [place] [address] [phone]       Signed: Patrica Duel 03/06/2013, 10:02 AM

## 2013-03-06 NOTE — Progress Notes (Signed)
Occupational Therapy Treatment Patient Details Name: DERRILL BAGNELL MRN: [id number] DOB: 01/23/1951 Today's Date: 03/06/2013 Time: 4782-9562 OT Time Calculation (min): 22 min  OT Assessment / Plan / Recommendation  History of present illness LTKA on 03/04/13. Pt has H/O Parkinson's Disease. On Sinemet 5x day.   Clinical Impression    OT comments    Follow Up Recommendations  Home health OT    Barriers to Discharge       Equipment Recommendations  None recommended by OT (has 3:1 and ETS)    Recommendations for Other Services    Frequency     Progress towards OT Goals Progress towards OT goals: Progressing toward goals  Plan      Precautions / Restrictions Precautions Precautions: Fall;Knee Required Braces or Orthoses: Knee Immobilizer - Left Knee Immobilizer - Left: Discontinue once straight leg raise with < 10 degree lag Restrictions Weight Bearing Restrictions: No   Pertinent Vitals/Pain No c/o pain    ADL  Toilet Transfer: Min guard (extra time) Toilet Transfer Method: Sit to stand Tub/Shower Transfer: Simulated;Minimal assistance Tub/Shower Transfer Method: Science writer: Walk in shower Transfers/Ambulation Related to ADLs: pt needs cues for step length, but pt was also cueing self. Got distracted at times.   ADL Comments: Used step as shower stall step.  Pt more safe going forward because of Parkinson's.  Spoke to wife about shower set up and toilet.  They have borrowed a 3:1 which can be used in shower.  They have an elevated toilet seat for the commode.  Educated that pt may need urinal sitting on seat, depending on fit.      OT Diagnosis:    OT Problem List:   OT Treatment Interventions:     OT Goals(current goals can now be found in the care plan section)    Visit Information  Last OT Received On: 03/06/13 Assistance Needed: +1 PT/OT Co-Evaluation/Treatment: Yes History of Present Illness: LTKA on 03/04/13. Pt has H/O  Parkinson's Disease. On Sinemet 5x day.    Subjective Data      Prior Functioning       Cognition  Cognition Arousal/Alertness: Awake/alert Behavior During Therapy: WFL for tasks assessed/performed Overall Cognitive Status: Within Functional Limits for tasks assessed    Mobility  Transfers Sit to Stand: 4: Min guard;From chair/3-in-1 (extra time) Details for Transfer Assistance: cues for scooting forward.  Pt is managing leg by himself.  Cues to use arms to push    Exercises      Balance     End of Session OT - End of Session Activity Tolerance: Patient tolerated treatment well Patient left: in chair;with call bell/phone within reach;with family/visitor present CPM Left Knee CPM Left Knee: Off  GO     Kaiea Esselman 03/06/2013, 8:53 AM Marica Otter, OTR/L [phone] 03/06/2013

## 2013-03-06 NOTE — Care Management Note (Signed)
    Page 1 of 2   03/06/2013     11:06:45 AM   CARE MANAGEMENT NOTE 03/06/2013  Patient:  James Schultz, James Schultz   Account Number:  [id number]  Date Initiated:  03/06/2013  Documentation initiated by:  Lanier Clam  Subjective/Objective Assessment:   ADMITTED W/OA L KNEE.     Action/Plan:   FROM HOME.LIVES IN [facility] [place].   Anticipated DC Date:  03/06/2013   Anticipated DC Plan:  HOME W HOME HEALTH SERVICES      DC Planning Services  CM consult      Choice offered to / List presented to:  C-1 Patient   DME arranged  [facility]  CPM      DME agency  [facility]     HH arranged  HH-2 PT  HH-3 OT      [facility] agency  [facility]   Status of service:  Completed, signed off Medicare Important Message given?   (If response is "NO", the following Medicare IM given date fields will be blank) Date Medicare IM given:   Date Additional Medicare IM given:    Discharge Disposition:  HOME W HOME HEALTH SERVICES  Per UR Regulation:  Reviewed for med. necessity/level of care/duration of stay  If discussed at Long Length of Stay Meetings, dates discussed:    Comments:  03/06/13 Aundreya Souffrant RN,BSN NCM [phone] [facility] CHOSEN FOR HH.KRISTEN [facility] REP AWARE OF D/C TODAY& HH PT/OT ORDER.RW,CPM ORDER FAXEDW/CONFIRMATION TO [facility] [employer] TEL#[phone](KAREN)WILL DELIVER DME TO PATIENT'S HOME SINCE MORE REASONABLE TO DELIVER TO [place] THAN TO HOSPITAL,MD IN AGREEMENT.

## 2013-03-06 NOTE — Progress Notes (Signed)
Physical Therapy Treatment Patient Details Name: James Schultz MRN: [id number] DOB: Dec 26, 1950 Today's Date: 03/06/2013 Time: 4098-1191 PT Time Calculation (min): 41 min  PT Assessment / Plan / Recommendation  History of Present Illness LTKA on 03/04/13. Pt has H/O Parkinson's Disease. On Sinemet 5x day.   Clinical Impression    PT Comments   Pt and wife have practiced 1 step, exercises. Wife feels confident with pt's mobility. Pt is able to ambulate without KI, appears to facilitate sit to stand to sit without KI. Wife aware to ambulate with pt at all times.  Follow Up Recommendations  Home health PT     Does the patient have the potential to tolerate intense rehabilitation     Barriers to Discharge        Equipment Recommendations  Rolling walker with 5" wheels    Recommendations for Other Services    Frequency 7X/week   Progress towards PT Goals Progress towards PT goals: Progressing toward goals (pt has practiced steps and ambulation. ready to go home.)  Plan Current plan remains appropriate    Precautions / Restrictions Precautions Precautions: Fall;Knee Required Braces or Orthoses: Knee Immobilizer - Left Knee Immobilizer - Left: Discontinue once straight leg raise with < 10 degree lag Restrictions Weight Bearing Restrictions: No   Pertinent Vitals/Pain States thigh is sore.    Mobility  Transfers Sit to Stand: 4: Min guard;From chair/3-in-1 (extra time) Details for Transfer Assistance: cues for scooting forward.  Pt is managing leg by himself.  Cues to use arms to push Ambulation/Gait Ambulation/Gait Assistance: 4: Min guard;4: Min Environmental consultant (Feet): 50 Feet Assistive device: Rolling walker Ambulation/Gait Assistance Details: frequent cues to stay within the RW, increas step length on R, posture. Gait Pattern: Step-to pattern;Decreased step length - right;Decreased step length - left;Decreased stance time - right;Decreased stance time -  left;Festinating;Decreased hip/knee flexion - right;Trunk flexed;Shuffle Gait velocity: decreased. Stairs: Yes Stairs Assistance: 4: Min assist Stairs Assistance Details (indicate cue type and reason): cues for sequence, wife present. Stair Management Technique: No rails;Step to pattern;Forwards;With walker Number of Stairs: 1    Exercises Total Joint Exercises Quad Sets: AROM;10 reps;Left Short Arc Quad: AROM;Left;10 reps Heel Slides: AAROM;Both;10 reps Hip ABduction/ADduction: AROM;Both;10 reps Straight Leg Raises: AAROM;Both;10 reps Goniometric ROM: 10-50   PT Diagnosis:    PT Problem List:   PT Treatment Interventions:     PT Goals (current goals can now be found in the care plan section)    Visit Information  Last PT Received On: 03/06/13 Assistance Needed: +1 History of Present Illness: LTKA on 03/04/13. Pt has H/O Parkinson's Disease. On Sinemet 5x day.    Subjective Data      Cognition  Cognition Arousal/Alertness: Awake/alert Behavior During Therapy: WFL for tasks assessed/performed Overall Cognitive Status: Within Functional Limits for tasks assessed    Balance     End of Session PT - End of Session Equipment Utilized During Treatment: Gait belt Activity Tolerance: Patient tolerated treatment well Patient left: in chair;with call bell/phone within reach;with family/visitor present Nurse Communication: Mobility status CPM Left Knee CPM Left Knee: Off   GP     Rada Hay 03/06/2013, 9:25 AM

## 2013-03-06 NOTE — Progress Notes (Signed)
   Subjective: 2 Days Post-Op Procedure(s) (LRB): LEFT TOTAL KNEE ARTHROPLASTY (Left) Patient reports pain as mild.   Patient seen in rounds with Dr. Lequita Halt. Patient is well, and has had no acute complaints or problems Patient is ready to go home  Objective: Vital signs in last 24 hours: Temp:  [98.1 F (36.7 C)-99 F (37.2 C)] 98.1 F (36.7 C) (07/23 0550) Pulse Rate:  [82-97] 82 (07/23 0550) Resp:  [16] 16 (07/23 0550) BP: (119-163)/(72-84) 163/84 mmHg (07/23 0550) SpO2:  [93 %-96 %] 96 % (07/23 0550)  Intake/Output from previous day:  Intake/Output Summary (Last 24 hours) at 03/06/13 0959 Last data filed at 03/06/13 0804  Gross per 24 hour  Intake    480 ml  Output      0 ml  Net    480 ml    Intake/Output this shift: Total I/O In: 240 [P.O.:240] Out: -   Labs:  Recent Labs  03/05/13 0407 03/06/13 0435  HGB 11.5* 11.0*    Recent Labs  03/05/13 0407 03/06/13 0435  WBC 15.2* 14.2*  RBC 3.99* 3.85*  HCT 33.9* 32.7*  PLT 188 157    Recent Labs  03/05/13 0407 03/06/13 0435  NA 139 138  K 3.9 3.9  CL 104 103  CO2 27 27  BUN 11 11  CREATININE 0.68 0.59  GLUCOSE 156* 119*  CALCIUM 8.7 8.8   No results found for this basename: LABPT, INR,  in the last 72 hours  EXAM: General - Patient is Alert and Appropriate Extremity - Neurovascular intact Sensation intact distally Dorsiflexion/Plantar flexion intact Incision - clean, dry, no drainage, healing Motor Function - intact, moving foot and toes well on exam.   Assessment/Plan: 2 Days Post-Op Procedure(s) (LRB): LEFT TOTAL KNEE ARTHROPLASTY (Left) Procedure(s) (LRB): LEFT TOTAL KNEE ARTHROPLASTY (Left) Past Medical History  Diagnosis Date  . Hypertension   . Anxiety   . Depression   . Parkinson's disease     lov note dr love 10-25-2011, lov note crae everywhere 02-19-2013 dr Azucena Cecil scott  . Headache(784.0)   . Overactive bladder   . Overactive bladder   . Spinal stenosis of lumbar region     . Dysrhythmia   . Sleep apnea     cpap, setting of 12, uses [place] prongs no mask used   Principal Problem:   OA (osteoarthritis) of knee Active Problems:   Unspecified essential hypertension   Unspecified sleep apnea   Parkinson's disease  Estimated body mass index is 35.01 kg/(m^2) as calculated from the following:   Height as of this encounter: 5\' 10"  (1.778 m).   Weight as of this encounter: 110.678 kg (244 lb). Up with therapy Discharge home with home health Diet - Cardiac diet Follow up - in 2 weeks Activity - WBAT Disposition - Home Condition Upon Discharge - Good D/C Meds - See DC Summary DVT Prophylaxis - Xarelto  PERKINS, ALEXZANDREW 03/06/2013, 9:59 AM

## 2013-04-12 ENCOUNTER — Inpatient Hospital Stay (HOSPITAL_COMMUNITY)
Admission: AD | Admit: 2013-04-12 | Discharge: 2013-04-16 | DRG: 482 | Disposition: A | Discharge status: Skilled Nursing Facility | Payer: Medicare Other | Source: Other Acute Inpatient Hospital | Attending: Internal Medicine | Admitting: Internal Medicine

## 2013-04-12 ENCOUNTER — Other Ambulatory Visit: Payer: Self-pay | Admitting: Specialist

## 2013-04-12 ENCOUNTER — Encounter (HOSPITAL_COMMUNITY): Payer: Self-pay | Admitting: *Deleted

## 2013-04-12 DIAGNOSIS — S72009A Fracture of unspecified part of neck of unspecified femur, initial encounter for closed fracture: Principal | ICD-10-CM | POA: Diagnosis present

## 2013-04-12 DIAGNOSIS — G4730 Sleep apnea, unspecified: Secondary | ICD-10-CM | POA: Diagnosis present

## 2013-04-12 DIAGNOSIS — F329 Major depressive disorder, single episode, unspecified: Secondary | ICD-10-CM | POA: Diagnosis present

## 2013-04-12 DIAGNOSIS — M48061 Spinal stenosis, lumbar region without neurogenic claudication: Secondary | ICD-10-CM | POA: Diagnosis present

## 2013-04-12 DIAGNOSIS — G20A1 Parkinson's disease without dyskinesia, without mention of fluctuations: Secondary | ICD-10-CM | POA: Diagnosis present

## 2013-04-12 DIAGNOSIS — I10 Essential (primary) hypertension: Secondary | ICD-10-CM | POA: Diagnosis present

## 2013-04-12 DIAGNOSIS — M1710 Unilateral primary osteoarthritis, unspecified knee: Secondary | ICD-10-CM | POA: Diagnosis present

## 2013-04-12 DIAGNOSIS — F3289 Other specified depressive episodes: Secondary | ICD-10-CM | POA: Diagnosis present

## 2013-04-12 DIAGNOSIS — G20 Parkinson's disease: Secondary | ICD-10-CM

## 2013-04-12 DIAGNOSIS — Z79899 Other long term (current) drug therapy: Secondary | ICD-10-CM

## 2013-04-12 DIAGNOSIS — F411 Generalized anxiety disorder: Secondary | ICD-10-CM | POA: Diagnosis present

## 2013-04-12 DIAGNOSIS — M179 Osteoarthritis of knee, unspecified: Secondary | ICD-10-CM | POA: Diagnosis present

## 2013-04-12 DIAGNOSIS — R296 Repeated falls: Secondary | ICD-10-CM | POA: Diagnosis present

## 2013-04-12 DIAGNOSIS — Z96659 Presence of unspecified artificial knee joint: Secondary | ICD-10-CM

## 2013-04-12 DIAGNOSIS — S72001A Fracture of unspecified part of neck of right femur, initial encounter for closed fracture: Secondary | ICD-10-CM

## 2013-04-12 MED ORDER — CLINDAMYCIN PHOSPHATE 900 MG/50ML IV SOLN
900.0000 mg | Freq: Three times a day (TID) | INTRAVENOUS | Status: AC
  Administered 2013-04-13: 06:00:00 900 mg via INTRAVENOUS
  Filled 2013-04-12: qty 50

## 2013-04-12 NOTE — Consult Note (Signed)
Reason for Consult: Right hip fracture  Referring Physician: EDP   James Schultz is an 62 y.o. male.  HPI: Larey Seat today on right hip  Past Medical History  Diagnosis Date  . Hypertension   . Anxiety   . Depression   . Parkinson's disease     lov note dr love 10-25-2011, lov note crae everywhere 02-19-2013 dr Azucena Cecil scott  . Headache(784.0)   . Overactive bladder   . Overactive bladder   . Spinal stenosis of lumbar region   . Dysrhythmia   . Sleep apnea     cpap, setting of 12, uses [place] prongs no mask used    Past Surgical History  Procedure Laterality Date  . Back surgery  2010    lumbar  . Right knee arthroscopy  2009  . Total knee arthroplasty Left 03/04/2013    Procedure: LEFT TOTAL KNEE ARTHROPLASTY;  Surgeon: Loanne Drilling, MD;  Location: [facility];  Service: Orthopedics;  Laterality: Left;    History reviewed. No pertinent family history.  Social History:  reports that he has never smoked. He has never used smokeless tobacco. He reports that he does not drink alcohol or use illicit drugs.  Allergies: No Known Allergies  Medications: Reviewed  No results found for this or any previous visit (from the past 48 hour(s)).  No results found.  Review of Systems  Musculoskeletal: Positive for joint pain and falls.  All other systems reviewed and are negative.   There were no vitals taken for this visit. Physical Exam  Constitutional: He is oriented to person, place, and time. He appears well-developed.  HENT:  Head: Normocephalic.  Eyes: Pupils are equal, round, and reactive to light.  Neck: Normal range of motion.  Cardiovascular: Normal rate.   Respiratory: Effort normal.  GI: Soft.  Musculoskeletal: He exhibits tenderness.  Pain with ROM right hip. NVI. No DVT  Neurological: He is alert and oriented to person, place, and time.  Skin: Skin is warm and dry.  Psychiatric: He has a normal mood and affect.   Xray: nondisplaced right femoral neck  fracture  Assessment/Plan: Right femoral neck fracture nondisplaced. Plan ORIF. Risks discussed.   Kathey Simer C 04/12/2013, 11:50 PM

## 2013-04-13 ENCOUNTER — Encounter (HOSPITAL_COMMUNITY)
Admission: AD | Disposition: A | Discharge status: Skilled Nursing Facility | Payer: Self-pay | Source: Other Acute Inpatient Hospital | Attending: Internal Medicine

## 2013-04-13 ENCOUNTER — Encounter (HOSPITAL_COMMUNITY): Payer: Self-pay | Admitting: Certified Registered Nurse Anesthetist

## 2013-04-13 ENCOUNTER — Inpatient Hospital Stay (HOSPITAL_COMMUNITY): Payer: Medicare Other | Admitting: Certified Registered Nurse Anesthetist

## 2013-04-13 ENCOUNTER — Inpatient Hospital Stay (HOSPITAL_COMMUNITY): Payer: Medicare Other

## 2013-04-13 ENCOUNTER — Inpatient Hospital Stay: Admit: 2013-04-13 | Payer: Self-pay | Admitting: Specialist

## 2013-04-13 ENCOUNTER — Encounter (HOSPITAL_COMMUNITY): Payer: Self-pay | Admitting: Anesthesiology

## 2013-04-13 DIAGNOSIS — I10 Essential (primary) hypertension: Secondary | ICD-10-CM

## 2013-04-13 DIAGNOSIS — S72009A Fracture of unspecified part of neck of unspecified femur, initial encounter for closed fracture: Principal | ICD-10-CM

## 2013-04-13 DIAGNOSIS — S72001A Fracture of unspecified part of neck of right femur, initial encounter for closed fracture: Secondary | ICD-10-CM

## 2013-04-13 DIAGNOSIS — G20 Parkinson's disease: Secondary | ICD-10-CM

## 2013-04-13 HISTORY — PX: HIP PINNING,CANNULATED: SHX1758

## 2013-04-13 LAB — CBC
MCH: 27.3 pg (ref 26.0–34.0)
MCHC: 32.9 g/dL (ref 30.0–36.0)
MCV: 82.9 fL (ref 78.0–100.0)
Platelets: 176 10*3/uL (ref 150–400)
RBC: 4.14 MIL/uL — ABNORMAL LOW (ref 4.22–5.81)

## 2013-04-13 LAB — COMPREHENSIVE METABOLIC PANEL
ALT: 7 U/L (ref 0–53)
AST: 14 U/L (ref 0–37)
CO2: 28 mEq/L (ref 19–32)
Calcium: 8.8 mg/dL (ref 8.4–10.5)
Creatinine, Ser: 0.72 mg/dL (ref 0.50–1.35)
GFR calc Af Amer: >90 mL/min (ref >90)
GFR calc non Af Amer: >90 mL/min (ref >90)
Glucose, Bld: 98 mg/dL (ref 70–99)
Sodium: 139 mEq/L (ref 135–145)
Total Protein: 6.3 g/dL (ref 6.0–8.3)

## 2013-04-13 LAB — URINALYSIS, ROUTINE W REFLEX MICROSCOPIC
Nitrite: NEGATIVE
Specific Gravity, Urine: 1.023 (ref 1.005–1.030)
Urobilinogen, UA: 0.2 mg/dL (ref 0.0–1.0)
pH: 5 (ref 5.0–8.0)

## 2013-04-13 LAB — PROTIME-INR
INR: 1.08 (ref 0.00–1.49)
Prothrombin Time: 13.8 seconds (ref 11.6–15.2)

## 2013-04-13 LAB — MRSA PCR SCREENING: MRSA by PCR: NEGATIVE

## 2013-04-13 LAB — APTT: aPTT: 28 seconds (ref 24–37)

## 2013-04-13 LAB — TYPE AND SCREEN

## 2013-04-13 SURGERY — FIXATION, FEMUR, NECK, PERCUTANEOUS, USING SCREW
Anesthesia: General | Site: Hip | Laterality: Right | Wound class: Clean

## 2013-04-13 MED ORDER — METOCLOPRAMIDE HCL 5 MG/ML IJ SOLN: 5.0000 mg | Freq: Three times a day (TID) | INTRAMUSCULAR | Status: DC | PRN

## 2013-04-13 MED ORDER — SENNOSIDES-DOCUSATE SODIUM 8.6-50 MG PO TABS
1.0000 | ORAL_TABLET | Freq: Every evening | ORAL | Status: DC | PRN
  Filled 2013-04-13: qty 1

## 2013-04-13 MED ORDER — ONDANSETRON HCL 4 MG PO TABS: 4.0000 mg | ORAL_TABLET | Freq: Four times a day (QID) | ORAL | Status: DC | PRN

## 2013-04-13 MED ORDER — LORAZEPAM 1 MG PO TABS
1.0000 mg | ORAL_TABLET | Freq: Four times a day (QID) | ORAL | Status: DC | PRN
  Administered 2013-04-13 – 2013-04-15 (×2): 1 mg via ORAL
  Filled 2013-04-13 (×2): qty 1

## 2013-04-13 MED ORDER — OXYCODONE-ACETAMINOPHEN 5-325 MG PO TABS: 1.0000 | ORAL_TABLET | ORAL | Status: DC | PRN

## 2013-04-13 MED ORDER — OXYCODONE HCL 5 MG PO TABS: 5.0000 mg | ORAL_TABLET | ORAL | Status: DC | PRN

## 2013-04-13 MED ORDER — DEXTROSE 5 % IV SOLN
500.0000 mg | Freq: Four times a day (QID) | INTRAVENOUS | Status: DC | PRN
  Filled 2013-04-13: qty 5

## 2013-04-13 MED ORDER — ONDANSETRON HCL 4 MG/2ML IJ SOLN: 4.0000 mg | Freq: Four times a day (QID) | INTRAMUSCULAR | Status: DC | PRN

## 2013-04-13 MED ORDER — METHOCARBAMOL 500 MG PO TABS
500.0000 mg | ORAL_TABLET | Freq: Four times a day (QID) | ORAL | Status: DC | PRN
  Administered 2013-04-13 – 2013-04-16 (×10): 500 mg via ORAL
  Filled 2013-04-13 (×10): qty 1

## 2013-04-13 MED ORDER — FERROUS SULFATE 325 (65 FE) MG PO TABS
325.0000 mg | ORAL_TABLET | Freq: Two times a day (BID) | ORAL | Status: DC
  Administered 2013-04-13 – 2013-04-16 (×6): 325 mg via ORAL
  Filled 2013-04-13 (×9): qty 1

## 2013-04-13 MED ORDER — ACETAMINOPHEN 650 MG RE SUPP: 650.0000 mg | Freq: Four times a day (QID) | RECTAL | Status: DC | PRN

## 2013-04-13 MED ORDER — ENOXAPARIN SODIUM 40 MG/0.4ML ~~LOC~~ SOLN
40.0000 mg | SUBCUTANEOUS | Status: DC
  Administered 2013-04-14 – 2013-04-16 (×3): 40 mg via SUBCUTANEOUS
  Filled 2013-04-13 (×4): qty 0.4

## 2013-04-13 MED ORDER — DEXTROSE 5 % IV SOLN
3.0000 g | Freq: Once | INTRAVENOUS | Status: AC
  Administered 2013-04-13: 12:00:00 3 g via INTRAVENOUS
  Filled 2013-04-13: qty 3000

## 2013-04-13 MED ORDER — CLINDAMYCIN PHOSPHATE 900 MG/50ML IV SOLN: INTRAVENOUS | Status: DC | PRN

## 2013-04-13 MED ORDER — HYDROMORPHONE HCL PF 1 MG/ML IJ SOLN
0.2500 mg | INTRAMUSCULAR | Status: DC | PRN
  Administered 2013-04-13: 0.5 mg via INTRAVENOUS

## 2013-04-13 MED ORDER — CLINDAMYCIN PHOSPHATE 900 MG/50ML IV SOLN
INTRAVENOUS | Status: DC | PRN
  Administered 2013-04-13: 900 mg via INTRAVENOUS

## 2013-04-13 MED ORDER — CEFAZOLIN SODIUM-DEXTROSE 2-3 GM-% IV SOLR
INTRAVENOUS | Status: DC | PRN
  Administered 2013-04-13: 2 g via INTRAVENOUS

## 2013-04-13 MED ORDER — OXYCODONE HCL 5 MG PO TABS
5.0000 mg | ORAL_TABLET | ORAL | Status: DC | PRN
  Administered 2013-04-14 – 2013-04-16 (×7): 10 mg via ORAL
  Filled 2013-04-13 (×7): qty 2

## 2013-04-13 MED ORDER — PRAMIPEXOLE DIHYDROCHLORIDE 0.25 MG PO TABS
0.5000 mg | ORAL_TABLET | ORAL | Status: DC
Start: 2013-04-13 — End: 2013-04-16
  Administered 2013-04-13 – 2013-04-16 (×7): 0.5 mg via ORAL
  Filled 2013-04-13 (×8): qty 2

## 2013-04-13 MED ORDER — MENTHOL 3 MG MT LOZG
1.0000 | LOZENGE | OROMUCOSAL | Status: DC | PRN
  Filled 2013-04-13: qty 9

## 2013-04-13 MED ORDER — HYDROMORPHONE HCL PF 1 MG/ML IJ SOLN
INTRAMUSCULAR | Status: AC
  Filled 2013-04-13: qty 1

## 2013-04-13 MED ORDER — HYDROCODONE-ACETAMINOPHEN 5-325 MG PO TABS
1.0000 | ORAL_TABLET | Freq: Four times a day (QID) | ORAL | Status: DC | PRN
  Administered 2013-04-13 – 2013-04-14 (×3): 2 via ORAL
  Filled 2013-04-13 (×2): qty 2

## 2013-04-13 MED ORDER — PHENOL 1.4 % MT LIQD
1.0000 | OROMUCOSAL | Status: DC | PRN
  Filled 2013-04-13: qty 177

## 2013-04-13 MED ORDER — ENOXAPARIN SODIUM 30 MG/0.3ML ~~LOC~~ SOLN
30.0000 mg | Freq: Two times a day (BID) | SUBCUTANEOUS | Status: DC
  Administered 2013-04-13: 22:00:00 30 mg via SUBCUTANEOUS
  Filled 2013-04-13 (×4): qty 0.3

## 2013-04-13 MED ORDER — CEFAZOLIN SODIUM-DEXTROSE 2-3 GM-% IV SOLR
2.0000 g | INTRAVENOUS | Status: AC
  Administered 2013-04-13: 06:00:00 2 g via INTRAVENOUS
  Filled 2013-04-13: qty 50

## 2013-04-13 MED ORDER — SUCCINYLCHOLINE CHLORIDE 20 MG/ML IJ SOLN
INTRAMUSCULAR | Status: DC | PRN
  Administered 2013-04-13: 100 mg via INTRAVENOUS

## 2013-04-13 MED ORDER — CLINDAMYCIN PHOSPHATE 900 MG/50ML IV SOLN
900.0000 mg | Freq: Four times a day (QID) | INTRAVENOUS | Status: AC
  Administered 2013-04-13 (×2): 900 mg via INTRAVENOUS
  Filled 2013-04-13 (×2): qty 50

## 2013-04-13 MED ORDER — LIDOCAINE HCL (PF) 2 % IJ SOLN
INTRAMUSCULAR | Status: DC | PRN
  Administered 2013-04-13: 50 mg

## 2013-04-13 MED ORDER — EPHEDRINE SULFATE 50 MG/ML IJ SOLN
INTRAMUSCULAR | Status: DC | PRN
  Administered 2013-04-13: 7.5 mg via INTRAVENOUS
  Administered 2013-04-13: 5 mg via INTRAVENOUS

## 2013-04-13 MED ORDER — CHLORHEXIDINE GLUCONATE 4 % EX LIQD
60.0000 mL | Freq: Once | CUTANEOUS | Status: AC
  Administered 2013-04-13: 4 via TOPICAL
  Filled 2013-04-13: qty 60

## 2013-04-13 MED ORDER — BISACODYL 10 MG RE SUPP: 10.0000 mg | Freq: Every day | RECTAL | Status: DC | PRN

## 2013-04-13 MED ORDER — CARBIDOPA-LEVODOPA 25-100 MG PO TABS
1.0000 | ORAL_TABLET | Freq: Every day | ORAL | Status: DC
  Administered 2013-04-13 – 2013-04-16 (×17): 1 via ORAL
  Filled 2013-04-13 (×21): qty 1

## 2013-04-13 MED ORDER — BUPROPION HCL ER (SR) 150 MG PO TB12
150.0000 mg | ORAL_TABLET | Freq: Two times a day (BID) | ORAL | Status: DC
  Administered 2013-04-13 – 2013-04-16 (×6): 150 mg via ORAL
  Filled 2013-04-13 (×8): qty 1

## 2013-04-13 MED ORDER — ACETAMINOPHEN 325 MG PO TABS: 650.0000 mg | ORAL_TABLET | Freq: Four times a day (QID) | ORAL | Status: DC | PRN

## 2013-04-13 MED ORDER — HYDROMORPHONE HCL PF 1 MG/ML IJ SOLN: 0.1000 mg | INTRAMUSCULAR | Status: DC | PRN

## 2013-04-13 MED ORDER — ENOXAPARIN SODIUM 30 MG/0.3ML ~~LOC~~ SOLN: 40.0000 mg | SUBCUTANEOUS | Status: DC

## 2013-04-13 MED ORDER — MORPHINE SULFATE 2 MG/ML IJ SOLN
2.0000 mg | INTRAMUSCULAR | Status: DC | PRN
  Administered 2013-04-13 – 2013-04-15 (×8): 2 mg via INTRAVENOUS
  Filled 2013-04-13 (×9): qty 1

## 2013-04-13 MED ORDER — PRAMIPEXOLE DIHYDROCHLORIDE 1 MG PO TABS
1.0000 mg | ORAL_TABLET | ORAL | Status: DC
  Administered 2013-04-13 – 2013-04-16 (×4): 1 mg via ORAL
  Filled 2013-04-13 (×5): qty 1

## 2013-04-13 MED ORDER — METHOCARBAMOL 500 MG PO TABS: 500.0000 mg | ORAL_TABLET | Freq: Four times a day (QID) | ORAL | Status: DC | PRN

## 2013-04-13 MED ORDER — RASAGILINE MESYLATE 1 MG PO TABS
1.0000 mg | ORAL_TABLET | Freq: Every morning | ORAL | Status: DC
  Administered 2013-04-14 – 2013-04-16 (×3): 1 mg via ORAL
  Filled 2013-04-13 (×4): qty 1

## 2013-04-13 MED ORDER — 0.9 % SODIUM CHLORIDE (POUR BTL) OPTIME
TOPICAL | Status: DC | PRN
  Administered 2013-04-13: 1000 mL

## 2013-04-13 MED ORDER — METHOCARBAMOL 500 MG PO TABS: 500.0000 mg | ORAL_TABLET | Freq: Three times a day (TID) | ORAL | Status: DC | PRN

## 2013-04-13 MED ORDER — PROPOFOL 10 MG/ML IV BOLUS
INTRAVENOUS | Status: DC | PRN
  Administered 2013-04-13: 150 mg via INTRAVENOUS

## 2013-04-13 MED ORDER — FENTANYL CITRATE 0.05 MG/ML IJ SOLN
INTRAMUSCULAR | Status: DC | PRN
  Administered 2013-04-13 (×2): 50 ug via INTRAVENOUS

## 2013-04-13 MED ORDER — SODIUM CHLORIDE 0.9 % IV SOLN
INTRAVENOUS | Status: DC
  Administered 2013-04-13: 01:00:00 via INTRAVENOUS
  Administered 2013-04-13: 75 mL/h via INTRAVENOUS

## 2013-04-13 MED ORDER — PROMETHAZINE HCL 25 MG/ML IJ SOLN: 6.2500 mg | INTRAMUSCULAR | Status: DC | PRN

## 2013-04-13 MED ORDER — METOCLOPRAMIDE HCL 10 MG PO TABS: 5.0000 mg | ORAL_TABLET | Freq: Three times a day (TID) | ORAL | Status: DC | PRN

## 2013-04-13 MED ORDER — ONDANSETRON HCL 4 MG/2ML IJ SOLN
INTRAMUSCULAR | Status: DC | PRN
  Administered 2013-04-13: 4 mg via INTRAVENOUS

## 2013-04-13 MED ORDER — LACTATED RINGERS IV SOLN
INTRAVENOUS | Status: DC | PRN
  Administered 2013-04-13: 09:00:00 via INTRAVENOUS

## 2013-04-13 SURGICAL SUPPLY — 47 items
BAG SPEC THK2 15X12 ZIP CLS (MISCELLANEOUS)
BAG ZIPLOCK 12X15 (MISCELLANEOUS) ×1 IMPLANT
BANDAGE GAUZE ELAST BULKY 4 IN (GAUZE/BANDAGES/DRESSINGS) ×2 IMPLANT
BLADE SURG SZ10 CARB STEEL (BLADE) ×1 IMPLANT
CHLORAPREP W/TINT 26ML (MISCELLANEOUS) IMPLANT
CLOTH 2% CHLOROHEXIDINE 3PK (PERSONAL CARE ITEMS) ×2 IMPLANT
CLOTH BEACON ORANGE TIMEOUT ST (SAFETY) ×2 IMPLANT
DRAPE STERI IOBAN 125X83 (DRAPES) ×2 IMPLANT
DRSG AQUACEL AG ADV 3.5X 6 (GAUZE/BANDAGES/DRESSINGS) ×1 IMPLANT
DRSG EMULSION OIL 3X16 NADH (GAUZE/BANDAGES/DRESSINGS) ×1 IMPLANT
DRSG PAD ABDOMINAL 8X10 ST (GAUZE/BANDAGES/DRESSINGS) ×1 IMPLANT
DURAPREP 26ML APPLICATOR (WOUND CARE) ×1 IMPLANT
DURASEAL SPINE SEALANT 3ML (MISCELLANEOUS) ×1 IMPLANT
ELECT REM PT RETURN 9FT ADLT (ELECTROSURGICAL) ×2
ELECTRODE REM PT RTRN 9FT ADLT (ELECTROSURGICAL) ×1 IMPLANT
GLOVE BIOGEL PI IND STRL 7.5 (GLOVE) ×1 IMPLANT
GLOVE BIOGEL PI IND STRL 8 (GLOVE) ×1 IMPLANT
GLOVE BIOGEL PI INDICATOR 7.5 (GLOVE) ×1
GLOVE BIOGEL PI INDICATOR 8 (GLOVE) ×1
GLOVE SURG SS PI 7.5 STRL IVOR (GLOVE) ×2 IMPLANT
GLOVE SURG SS PI 8.0 STRL IVOR (GLOVE) ×4 IMPLANT
GOWN PREVENTION PLUS LG XLONG (DISPOSABLE) ×2 IMPLANT
GOWN PREVENTION PLUS XLARGE (GOWN DISPOSABLE) ×2 IMPLANT
GOWN STRL REIN XL XLG (GOWN DISPOSABLE) ×4 IMPLANT
IV CATH 14GX2 1/4 (CATHETERS) ×2 IMPLANT
KIT BASIN OR (CUSTOM PROCEDURE TRAY) ×2 IMPLANT
MANIFOLD NEPTUNE II (INSTRUMENTS) ×1 IMPLANT
PACK GENERAL/GYN (CUSTOM PROCEDURE TRAY) ×2 IMPLANT
PAD CAST 4YDX4 CTTN HI CHSV (CAST SUPPLIES) ×1 IMPLANT
PADDING CAST COTTON 4X4 STRL (CAST SUPPLIES) ×2
PIN THREADED GUIDE ACE (PIN) ×1 IMPLANT
POSITIONER SURGICAL ARM (MISCELLANEOUS) ×3 IMPLANT
SCREW CANN 6.5 85MM (Screw) ×2 IMPLANT
SCREW CANN 6.5 90MM (Screw) ×2 IMPLANT
SCREW CANN LG 6.5 FLT 85X22 (Screw) IMPLANT
SCREW CANN LG 6.5 FLT 90X22 (Screw) IMPLANT
SPONGE GAUZE 4X4 12PLY (GAUZE/BANDAGES/DRESSINGS) ×1 IMPLANT
SPONGE LAP 18X18 X RAY DECT (DISPOSABLE) IMPLANT
STAPLER VISISTAT (STAPLE) ×1 IMPLANT
STAPLER VISISTAT 35W (STAPLE) ×1 IMPLANT
SUT NURALON 4 0 TR CR/8 (SUTURE) ×1 IMPLANT
SUT VIC AB 1 CT1 27 (SUTURE) ×2
SUT VIC AB 1 CT1 27XBRD ANTBC (SUTURE) ×2 IMPLANT
SUT VIC AB 2-0 CT1 27 (SUTURE) ×2
SUT VIC AB 2-0 CT1 27XBRD (SUTURE) ×2 IMPLANT
TRAY FOLEY CATH 14FRSI W/METER (CATHETERS) IMPLANT
WATER STERILE IRR 1500ML POUR (IV SOLUTION) ×1 IMPLANT

## 2013-04-13 NOTE — Progress Notes (Signed)
Received patient from PACU, drowsy , arousable, verbally responsive. Surgical site dressing dry and intact.

## 2013-04-13 NOTE — Progress Notes (Signed)
Spoke with patient about nocturnal CPAP. He declines at this time. However, he does wish to begin using it tomorrow night. Wife in the room, who gives impression that the patient is historically noncompliant with nocturnal use at home. RT to readdress for sleep the night of 04/13/13 as the patient suggests. At this time, VSS on room air. Patient arouses easily and is alert and oriented upon being awakened.

## 2013-04-13 NOTE — Transfer of Care (Signed)
Immediate Anesthesia Transfer of Care Note  Patient: James Schultz  Procedure(s) Performed: Procedure(s) (LRB): CANNULATED HIP PINNING (Right)  Patient Location: PACU  Anesthesia Type: General  Level of Consciousness: sedated, patient cooperative and responds to stimulaton  Airway & Oxygen Therapy: Patient Spontanous Breathing and Patient connected to face mask oxgen  Post-op Assessment: Report given to PACU RN and Post -op Vital signs reviewed and stable  Post vital signs: Reviewed and stable  Complications: No apparent anesthesia complications

## 2013-04-13 NOTE — Progress Notes (Addendum)
Pt agreed to try nasal cpap tonight at home settings of 12cm h2o.  Almost immediately after placing mask on, pt stated "take it off".  Despite encouragement and offering to adjust settings to pt comfort, pt said he doesn't want to wear it tonight.  Pt was advised that RT available all night should he change his mind.  RN notified.  Pt placed back on 3lnc as found.

## 2013-04-13 NOTE — Anesthesia Preprocedure Evaluation (Signed)
Anesthesia Evaluation  Patient identified by MRN, date of birth, ID band Patient awake  General Assessment Comment:.  Hypertension     .  Anxiety     .  Depression     .  Parkinson's disease         lov note dr love 10-25-2011, lov note crae everywhere 02-19-2013 dr Azucena Cecil scott   .  Headache(784.0)     .  Overactive bladder     .  Overactive bladder     .  Spinal stenosis of lumbar region     .  Dysrhythmia     .  Sleep apnea         cpap, setting of 12, uses [place] prongs no mask used     Reviewed: Allergy & Precautions, H&P , NPO status , Patient's Chart, lab work & pertinent test results  Airway Mallampati: II TM Distance: >3 FB Neck ROM: Full    Dental no notable dental hx.    Pulmonary sleep apnea ,  breath sounds clear to auscultation  Pulmonary exam normal       Cardiovascular Exercise Tolerance: Good hypertension, Pt. on medications negative cardio ROS  + dysrhythmias Rhythm:Regular Rate:Normal     Neuro/Psych  Headaches, PSYCHIATRIC DISORDERS Anxiety Depression  Neuromuscular disease    GI/Hepatic negative GI ROS, Neg liver ROS,   Endo/Other  negative endocrine ROS  Renal/GU negative Renal ROS  negative genitourinary   Musculoskeletal negative musculoskeletal ROS (+)   Abdominal (+) + obese,   Peds negative pediatric ROS (+)  Hematology negative hematology ROS (+)   Anesthesia Other Findings   Reproductive/Obstetrics negative OB ROS                           Anesthesia Physical Anesthesia Plan  ASA: III  Anesthesia Plan: General   Post-op Pain Management:    Induction: Intravenous  Airway Management Planned: Oral ETT  Additional Equipment:   Intra-op Plan:   Post-operative Plan: Extubation in OR  Informed Consent: I have reviewed the patients History and Physical, chart, labs and discussed the procedure including the risks, benefits and alternatives for the proposed  anesthesia with the patient or authorized representative who has indicated his/her understanding and acceptance.   Dental advisory given  Plan Discussed with: CRNA  Anesthesia Plan Comments:         Anesthesia Quick Evaluation

## 2013-04-13 NOTE — Anesthesia Postprocedure Evaluation (Signed)
  Anesthesia Post-op Note  Patient: TRYGVE THAL  Procedure(s) Performed: Procedure(s) (LRB): CANNULATED HIP PINNING (Right)  Patient Location: PACU  Anesthesia Type: General  Level of Consciousness: awake and alert   Airway and Oxygen Therapy: Patient Spontanous Breathing  Post-op Pain: mild  Post-op Assessment: Post-op Vital signs reviewed, Patient's Cardiovascular Status Stable, Respiratory Function Stable, Patent Airway and No signs of Nausea or vomiting  Last Vitals:  Filed Vitals:   04/13/13 1025  BP: 119/66  Pulse: 76  Temp: 36.4 C  Resp: 12    Post-op Vital Signs: stable   Complications: No apparent anesthesia complications

## 2013-04-13 NOTE — Brief Op Note (Signed)
04/12/2013 - 04/13/2013  8:53 AM  PATIENT:  James Schultz  62 y.o. male  PRE-OPERATIVE DIAGNOSIS:  right femoral neck fracture  POST-OPERATIVE DIAGNOSIS:  right femoral neck fracture  PROCEDURE:  Procedure(s) with comments: CANNULATED HIP PINNING (Right) - PERCUTANEOUS CANNULATED RIGHT HIP PINNING ORIF right hip  SURGEON:  Surgeon(s) and Role:    * Javier Docker, MD - Primary  PHYSICIAN ASSISTANT:   ASSISTANTS: none   ANESTHESIA:   none general  EBL:  Total I/O In: 1000 [I.V.:1000] Out: 250 [Urine:250]  BLOOD ADMINISTERED:none  DRAINS: none   LOCAL MEDICATIONS USED:  MARCAINE    and NONE  SPECIMEN:  No Specimen  DISPOSITION OF SPECIMEN:  N/A  COUNTS:  YES  TOURNIQUET:  * No tourniquets in log *  DICTATION: .Other Dictation: Dictation Number [id number]  PLAN OF CARE: Admit to inpatient   PATIENT DISPOSITION:  PACU - hemodynamically stable.   Delay start of Pharmacological VTE agent (>24hrs) due to surgical blood loss or risk of bleeding: no

## 2013-04-13 NOTE — Progress Notes (Signed)
[facility] PROGRESS NOTE  James Schultz BJY:[id number] DOB: [birth year] DOA: 04/12/2013 PCP: Ignatius Specking., MD  Assessment/Plan: 1. R hip fracture -s/p Hip pinning today -start DVT proph: lovenox -Physical therapy tomorrow -monitor urine output, labs in am  2. Parkinson's disease -continue sinemet and pramipexole  3. HTN -stable, ACE on hold  4. DVT proph: lovenox  Code Status: Full Family Communication: d/w pt and friend at bedside Disposition Plan: inpatient, May need rehab   Consultants:  ORtho Dr.Beane  Antibiotics:  Peri-op ancef  HPI/Subjective: Feels ok, some stinging sensation at surgical site  Objective: Filed Vitals:   04/15/2013 1025  BP: 119/66  Pulse: 76  Temp: 97.5 F (36.4 C)  Resp: 12    Intake/Output Summary (Last 24 hours) at 2013-04-15 1108 Last data filed at 04/15/2013 1015  Gross per 24 hour  Intake   1400 ml  Output    375 ml  Net   1025 ml   Filed Weights   04/12/13 2342  Weight: 112.2 kg (247 lb 5.7 oz)    Exam:   General:  Awake, somewhat drowsy  Cardiovascular:S1S2/RRR  Respiratory: CTAB  Abdomen:soft, NT, BS present  Musculoskeletal: no edema c/c  Data Reviewed: Basic Metabolic Panel:  Recent Labs Lab 2013-04-15 0050  NA 139  K 3.6  CL 104  CO2 28  GLUCOSE 98  BUN 11  CREATININE 0.72  CALCIUM 8.8   Liver Function Tests:  Recent Labs Lab April 15, 2013 0050  AST 14  ALT 7  ALKPHOS 73  BILITOT 0.4  PROT 6.3  ALBUMIN 3.2*   No results found for this basename: LIPASE, AMYLASE,  in the last 168 hours No results found for this basename: AMMONIA,  in the last 168 hours CBC:  Recent Labs Lab 2013/04/15 0050  WBC 8.0  HGB 11.3*  HCT 34.3*  MCV 82.9  PLT 176   Cardiac Enzymes: No results found for this basename: CKTOTAL, CKMB, CKMBINDEX, TROPONINI,  in the last 168 hours BNP (last 3 results) No results found for this basename: PROBNP,  in the last 8760 hours CBG: No results found for this  basename: GLUCAP,  in the last 168 hours  Recent Results (from the past 240 hour(s))  MRSA PCR SCREENING     Status: None   Collection Time    04/12/13 11:59 PM      Result Value Range Status   MRSA by PCR NEGATIVE  NEGATIVE Final   Comment:            The GeneXpert MRSA Assay (FDA     approved for NASAL specimens     only), is one component of a     comprehensive MRSA colonization     surveillance program. It is not     intended to diagnose MRSA     infection nor to guide or     monitor treatment for     MRSA infections.     Studies: Dg Hip Operative Right  2013/04/15   *RADIOLOGY REPORT*  Clinical Data: Right femoral fracture  DG OPERATIVE RIGHT HIP  Comparison: 04/12/2013  Findings: Two spot films were obtained intraoperatively and reveal three compression screws traversing the known femoral neck fracture.  Fracture fragments are in anatomic alignment.  42 seconds of fluoroscopy was utilized.   Original Report Authenticated By: Alcide Clever, M.D.   Dg Pelvis Portable  04/15/2013   *RADIOLOGY REPORT*  Clinical Data: Hip fracture fixation  PORTABLE PELVIS  Comparison: 04/12/2013  Findings: Three pins  are present across the right femoral neck fracture.  Satisfactory alignment of the fracture.  Hardware in good position.  Mild degenerative change in the right hip joint.  IMPRESSION: Satisfactory fracture fixation of right femoral neck.   Original Report Authenticated By: Janeece Riggers, M.D.    Scheduled Meds: . buPROPion  150 mg Oral BID  . carbidopa-levodopa  1 tablet Oral 5 X Daily  .  ceFAZolin (ANCEF) IV  3 g Intravenous Once  . clindamycin (CLEOCIN) IV  900 mg Intravenous Q6H  . enoxaparin (LOVENOX) injection  30 mg Subcutaneous Q12H  . [START ON 04/14/2013] enoxaparin (LOVENOX) injection  40 mg Subcutaneous Q24H  . ferrous sulfate  325 mg Oral BID PC  . HYDROmorphone      . pramipexole  0.5 mg Oral Custom  . pramipexole  1 mg Oral Q24H  . rasagiline  1 mg Oral q morning - 10a    Continuous Infusions: . sodium chloride Stopped (04/13/13 0848)    Principal Problem:   Closed right hip fracture Active Problems:   OA (osteoarthritis) of knee   Unspecified essential hypertension   Parkinson's disease    Time spent:    [facility]  [facility] Pager [phone]. If 7PM-7AM, please contact night-coverage at [email or web address], password [facility] 04/13/2013, 11:08 AM  LOS: 1 day

## 2013-04-13 NOTE — Interval H&P Note (Signed)
History and Physical Interval Note:  04/13/2013 7:16 AM  James Schultz  has presented today for surgery, with the diagnosis of right femoral neck fracture  The various methods of treatment have been discussed with the patient and family. After consideration of risks, benefits and other options for treatment, the patient has consented to  Procedure(s) with comments: CANNULATED HIP PINNING (Right) - PERCUTANEOUS CANNULATED RIGHT HIP PINNING as a surgical intervention .  The patient's history has been reviewed, patient examined, no change in status, stable for surgery.  I have reviewed the patient's chart and labs.  Questions were answered to the patient's satisfaction.     Donte Kary C

## 2013-04-13 NOTE — H&P (Signed)
[facility] History and Physical  Patient: James Schultz  ZOX:[id number]  DOB: [birth year]  DOA: 04/12/2013  Referring physician: [facility] PCP: Ignatius Specking., MD  Specialists: Dr. Shelle Iron  Chief Complaint: Fall  HPI: James Schultz is a 62 y.o. male with Past medical history of Parkinson's disease, sleep apnea, hypertension. He presented today with the complaint of a fall.  He was at his baseline when around the evening when he was standing from the chair and turn around he had some stuttering of his gait and he fell down on the tile floor. He denies any chest pain, palpitation, dizziness, lightheadedness, syncope, near syncope, in control of bowel or bladder during the episode. The wife denies any confusion. Patient denies any other injuries anywhere else. He denies any headache, neck pain, back pain. Into the ER in [facility] he was found to have right-sided hip fracture. He recently had total knee arthroplasty on the left side. He denies any recent change in his medication, or diarrhea or urinary symptoms. He denies any recurrent orthostatic dizziness.  Review of Systems: as mentioned in the history of present illness.  A Comprehensive review of the other systems is negative.  Past Medical History  Diagnosis Date  . Hypertension   . Anxiety   . Depression   . Parkinson's disease     lov note dr love 10-25-2011, lov note crae everywhere 02-19-2013 dr Azucena Cecil scott  . Headache(784.0)   . Overactive bladder   . Overactive bladder   . Spinal stenosis of lumbar region   . Dysrhythmia   . Sleep apnea     cpap, setting of 12, uses Chester prongs no mask used   Past Surgical History  Procedure Laterality Date  . Back surgery  2010    lumbar  . Right knee arthroscopy  2009  . Total knee arthroplasty Left 03/04/2013    Procedure: LEFT TOTAL KNEE ARTHROPLASTY;  Surgeon: Loanne Drilling, MD;  Location: [facility]S;  Service: Orthopedics;  Laterality: Left;   Social  History:  reports that he has never smoked. He has never used smokeless tobacco. He reports that he does not drink alcohol or use illicit drugs. Patient is coming from home. Independent for most of his  ADL.  No Known Allergies  History reviewed. No pertinent family history.  Prior to Admission medications   Medication Sig Start Date End Date Taking? Authorizing Provider  aspirin EC 81 MG tablet Take 81 mg by mouth every morning.   Yes Historical Provider, MD  buPROPion (WELLBUTRIN SR) 150 MG 12 hr tablet Take 150 mg by mouth 2 (two) times daily.   Yes Historical Provider, MD  carbidopa-levodopa (SINEMET IR) 25-100 MG per tablet Take 1 tablet by mouth 5 (five) times daily.   Yes Historical Provider, MD  docusate sodium (COLACE) 100 MG capsule Take 100 mg by mouth daily as needed for constipation.   Yes Historical Provider, MD  lisinopril (PRINIVIL,ZESTRIL) 10 MG tablet Take 10 mg by mouth every morning.   Yes Historical Provider, MD  LORazepam (ATIVAN) 1 MG tablet Take 1 mg by mouth 4 (four) times daily as needed for anxiety.   Yes Historical Provider, MD  methocarbamol (ROBAXIN) 500 MG tablet Take 500 mg by mouth every 6 (six) hours as needed (muscle spasm).   Yes Historical Provider, MD  oxyCODONE (OXY IR/ROXICODONE) 5 MG immediate release tablet Take 1-2 tablets (5-10 mg total) by mouth every 3 (three) hours as needed. 03/06/13  Yes Alexzandrew Julien Girt,  PA-C  pramipexole (MIRAPEX) 1 MG tablet Take 0.5-1 mg by mouth 3 (three) times daily. Takes 1 tablet in the morning 6am and half tablet at 10 am and half tablet at 2 pm   Yes Historical Provider, MD  rasagiline (AZILECT) 1 MG TABS Take 1 mg by mouth every morning.    Yes Historical Provider, MD    Physical Exam: Filed Vitals:   04/12/13 2342  BP: 142/75  Pulse: 85  Temp: 98.4 F (36.9 C)  TempSrc: Oral  Resp: 20  Height: 5\' 10"  (1.778 m)  Weight: 112.2 kg (247 lb 5.7 oz)  SpO2: 93%    General: Alert, Awake and Oriented to Time,  Place and Person. Appear in mild distress Eyes: PERRL ENT: Oral Mucosa clear moist. Neck: No JVD, no Carotid Bruits  Cardiovascular: S1 and S2 Present, no Murmur, Peripheral Pulses Present Respiratory: Bilateral Air entry equal and Decreased, Clear to Auscultation,  No Crackles, no wheezes Abdomen: Bowel Sound Present, Soft and Non tender Skin: No Rash, superficial skin disruption on right elbow,  Extremities: No Pedal edema, no calf tenderness, range of motion intact on right elbow Neurologic: Grossly Unremarkable. Sensations intact.  Presence of tremors in both hands.  Labs on Admission:  CBC:  Recent Labs Lab 04/13/13 0050  WBC 8.0  HGB 11.3*  HCT 34.3*  MCV 82.9  PLT 176    CMP     Component Value Date/Time   NA 139 04/13/2013 0050   K 3.6 04/13/2013 0050   CL 104 04/13/2013 0050   CO2 28 04/13/2013 0050   GLUCOSE 98 04/13/2013 0050   BUN 11 04/13/2013 0050   CREATININE 0.72 04/13/2013 0050   CALCIUM 8.8 04/13/2013 0050   PROT 6.3 04/13/2013 0050   ALBUMIN 3.2* 04/13/2013 0050   AST 14 04/13/2013 0050   ALT 7 04/13/2013 0050   ALKPHOS 73 04/13/2013 0050   BILITOT 0.4 04/13/2013 0050   GFRNONAA >90 04/13/2013 0050   GFRAA >90 04/13/2013 0050   Assessment/Plan Principal Problem:   Closed right hip fracture Active Problems:   OA (osteoarthritis) of knee   Unspecified essential hypertension   Parkinson's disease   1. Closed right hip fracture Patient had a mechanical fall that led to an fracture femur neck. Orthopedic as been consulted and patient has been scheduled for surgery. Patient will be kept n.p.o., IV fluids, IV morphine for pain as needed,  Type and cross will be ordered. Nonweightbearing on the leg.  As he does not have any significant cardiac history noted for EKG. His chest x-ray and other facility was without any acute abnormality.  2. Parkinson disease Patient does not appear to have persistent orthostatic hypotension related to Parkinson  syndrome. Continue Sinemet, Mirapex at home dose.  3. Hypertension Patient appears hemodynamically stable. At present holding lisinopril.  DVT Prophylaxis: subcutaneous Heparin Nutrition: N.p.o.  Code Status: Full  Family Communication: Wife was at bedside and all questions answered.   Author: Lynden Oxford, MD [facility] Pager: [phone] 04/13/2013, 4:00 AM    If 7PM-7AM, please contact night-coverage [email or web address] Password [email or web address]

## 2013-04-13 NOTE — Preoperative (Signed)
Beta Blockers   Reason not to administer Beta Blockers:Not Applicable 

## 2013-04-13 NOTE — Op Note (Signed)
James Schultz, KIDD NO.:  [id number]  MEDICAL RECORD NO.:  [id number]  LOCATION:  1403                         FACILITY:  [facility]  PHYSICIAN:  Jene Every, M.D.    DATE OF BIRTH:  28-Dec-1950  DATE OF PROCEDURE:  04/13/2013 DATE OF DISCHARGE:                              OPERATIVE REPORT   PREOPERATIVE DIAGNOSIS:  Right femoral neck fracture.  POSTOPERATIVE DIAGNOSIS:  Right femoral neck fracture.  PROCEDURE PERFORMED:  Open reduction and internal fixation with percutaneous 65 cannulated screws of the right hip.  ANESTHESIA:  General.  ASSISTANT:  No assistant.  HISTORY:  A 62 year old, fell on the hip and sustained a femoral neck fracture, essentially nondisplaced.  Indicated for ORIF.  Risks and benefits discussed including bleeding, infection, nonunion need for revision for total hip, DVT, PE, anesthetic complications, etc.  TECHNIQUE:  With the patient in supine position, after induction of adequate general anesthesia, 2 g of Kefzol and 900 clindamycin, placed on the fracture table.  Foley to gravity.  Peroneal post well padded. Right lower extremity in neutral longitudinal traction.  Right leg with external rotation and abduction.  With internally rotated, the hip maximized into position of the fracture site.  We prepped and draped the leg in usual sterile fashion.  I made an incision inferior to the greater trochanter.  Subcutaneous tissue was dissected.  Electrocautery was utilized to achieve hemostasis.  Fascia lata identified and divided in line with skin incision.  We then used an angled guide and advanced three cannulated screws up into the femoral head and neck in parallel fashion, and in the AP and lateral plane satisfactorily crossing the fracture site.  We used 90 mm screw and two 85 mm screws with excellent purchase in the AP and lateral plane closing the fracture site down.  We then copiously irrigated the wound and closed the fascia  with #1 Vicryl subcutaneous with #2 and skin with staples.  Wound was dressed sterilely.  He was released from the fracture table, rotation equivalent, pulses intact, extubated without difficulty and transported to the recovery room in satisfactory condition.  The patient tolerated the procedure well.  No complications.  No assistant.  Minimal blood loss.    Jene Every, M.D.    Cordelia Pen  D:  04/13/2013  T:  04/13/2013  Job:  [id number]

## 2013-04-14 LAB — BASIC METABOLIC PANEL
Calcium: 8.7 mg/dL (ref 8.4–10.5)
GFR calc Af Amer: >90 mL/min (ref >90)
GFR calc non Af Amer: >90 mL/min (ref >90)
Glucose, Bld: 97 mg/dL (ref 70–99)
Potassium: 3.8 mEq/L (ref 3.5–5.1)
Sodium: 137 mEq/L (ref 135–145)

## 2013-04-14 LAB — CBC
Hemoglobin: 11.2 g/dL — ABNORMAL LOW (ref 13.0–17.0)
MCH: 27.4 pg (ref 26.0–34.0)
MCHC: 32.8 g/dL (ref 30.0–36.0)
Platelets: 168 10*3/uL (ref 150–400)
RDW: 13.4 % (ref 11.5–15.5)

## 2013-04-14 NOTE — Progress Notes (Addendum)
    Subjective: 1 Day Post-Op Procedure(s) (LRB): CANNULATED HIP PINNING (Right) Patient reports pain as 4 on 0-10 scale.   Denies CP or SOB.  Positive flatus. Objective: Vital signs in last 24 hours: Temp:  [97.5 F (36.4 C)-98.8 F (37.1 C)] 98.2 F (36.8 C) (08/31 0428) Pulse Rate:  [73-79] 76 (08/31 0428) Resp:  [9-18] 18 (08/31 0800) BP: (102-128)/(54-71) 105/70 mmHg (08/31 0428) SpO2:  [96 %-100 %] 98 % (08/31 0800)  Intake/Output from previous day: 08/30 0701 - 08/31 0700 In: 3705 [P.O.:840; I.V.:2815; IV Piggyback:50] Out: 1785 [Urine:1785] Intake/Output this shift:    Labs:  Recent Labs  04/13/13 0050 04/14/13 0535  HGB 11.3* 11.2*    Recent Labs  04/13/13 0050 04/14/13 0535  WBC 8.0 7.0  RBC 4.14* 4.09*  HCT 34.3* 34.1*  PLT 176 168    Recent Labs  04/13/13 0050 04/14/13 0535  NA 139 137  K 3.6 3.8  CL 104 103  CO2 28 29  BUN 11 8  CREATININE 0.72 0.75  GLUCOSE 98 97  CALCIUM 8.8 8.7    Recent Labs  04/13/13 0050  INR 1.08    Physical Exam: Neurologically intact ABD soft Incision: dressing C/D/I Compartment soft  Assessment/Plan: 1 Day Post-Op Procedure(s) (LRB): CANNULATED HIP PINNING (Right) Advance diet Up with therapy Ok for condom catheter if incontinence of urine becomes issue     Declan Adamson D for Dr. Venita Lick [facility] [phone] 04/14/2013, 8:48 AM

## 2013-04-14 NOTE — Evaluation (Signed)
Physical Therapy Evaluation Patient Details Name: James Schultz MRN: [id number] DOB: Feb 08, 1951 Today's Date: 04/14/2013 Time: 6578-4696 PT Time Calculation (min): 33 min  PT Assessment / Plan / Recommendation History of Present Illness   s/p right hip pinning on 04/13/13; LTKA on 03/04/13. Pt has H/O Parkinson's Disease.   Clinical Impression  Pt will benefit from PT in acute setting as well as post acute therapies at SNF level; Anticipate slower progress due to recent knee surgery with baseline Parkinson's and then new right hip pinning;    PT Assessment  Patient needs continued PT services    Follow Up Recommendations  SNF    Does the patient have the potential to tolerate intense rehabilitation      Barriers to Discharge        Equipment Recommendations  None recommended by PT    Recommendations for Other Services     Frequency Min 3X/week    Precautions / Restrictions Precautions Precautions: Fall;Knee Precaution Comments: parkinson's; LTKA ~23month ago Restrictions RLE Weight Bearing: Partial weight bearing RLE Partial Weight Bearing Percentage or Pounds: 10%   Pertinent Vitals/Pain C/o pain, was premedicated, RN notified      Mobility  Bed Mobility Bed Mobility: Supine to Sit;Sitting - Scoot to Edge of Bed Supine to Sit: 1: +2 Total assist Supine to Sit: Patient Percentage: 20% Sitting - Scoot to Edge of Bed: 1: +2 Total assist Sitting - Scoot to Edge of Bed: Patient Percentage: 10% Details for Bed Mobility Assistance: +2 for UB, LB assist; pt with rigidity due to baseline Parkinson's and requires incr time/facilitation to self assist  Transfers Transfers: Sit to Stand;Stand to Sit;Stand Pivot Transfers Sit to Stand: 1: +2 Total assist Sit to Stand: Patient Percentage: 20% Stand to Sit: 1: +2 Total assist Stand to Sit: Patient Percentage: 20% Stand Pivot Transfers: 1: +2 Total assist Stand Pivot Transfers: Patient Percentage: 20% Details for Transfer  Assistance: Pt requiring +2 for balance, safety, IV, wt shift; much difficulty with wt shift to advance both LEs    Exercises Total Joint Exercises Ankle Circles/Pumps: AROM;Both;10 reps Heel Slides: AAROM;Left;5 reps   PT Diagnosis: Difficulty walking  PT Problem List: Decreased strength;Decreased range of motion;Decreased activity tolerance;Decreased balance;Decreased mobility;Decreased knowledge of precautions;Decreased knowledge of use of DME PT Treatment Interventions: DME instruction;Gait training;Functional mobility training;Therapeutic activities;Therapeutic exercise;Patient/family education     PT Goals(Current goals can be found in the care plan section) Acute Rehab PT Goals Patient Stated Goal: decr pain PT Goal Formulation: With patient Time For Goal Achievement: 04/14/13 Potential to Achieve Goals: Fair  Visit Information  History of Present Illness: LTKA on 03/04/13. Pt has H/O Parkinson's Disease. NOw s/p right hip pinning       Prior Functioning  Home Living Family/patient expects to be discharged to:: Private residence Living Arrangements: Spouse/significant other Available Help at Discharge: Family Type of Home: House Home Access: Stairs to enter Secretary/administrator of Steps: 1 Entrance Stairs-Rails: None Home Layout: One level Home Equipment: Environmental consultant - 2 wheels;Cane - single point Additional Comments: wife has h/o back sx.  Can't lift Prior Function Comments: pt was amb with cane and still getting HHPT at home Communication Communication: No difficulties    Cognition  Cognition Arousal/Alertness: Awake/alert Behavior During Therapy: WFL for tasks assessed/performed Overall Cognitive Status: Within Functional Limits for tasks assessed    Extremity/Trunk Assessment Upper Extremity Assessment Upper Extremity Assessment: Overall WFL for tasks assessed Lower Extremity Assessment Lower Extremity Assessment: RLE deficits/detail;LLE deficits/detail RLE  Deficits /  Details: pt rigid due to Parkinson's, limited knee and hip flexion due to pain RLE: Unable to fully assess due to pain LLE Deficits / Details: recent L TKA with limited AAROM~15-40 degrees flexion   Balance Balance Balance Assessed: Yes Static Sitting Balance Static Sitting - Balance Support: Right upper extremity supported;Left upper extremity supported;Bilateral upper extremity supported Static Sitting - Level of Assistance: 4: Min assist;5: Stand by assistance Static Standing Balance Static Standing - Balance Support: Bilateral upper extremity supported Static Standing - Level of Assistance: 2: Max assist  End of Session PT - End of Session Equipment Utilized During Treatment: Gait belt Activity Tolerance: Patient limited by pain;Patient limited by fatigue Patient left: in chair;with call bell/phone within reach Nurse Communication: Mobility status;Need for lift equipment  GP     [facility] 04/14/2013, 1:20 PM

## 2013-04-14 NOTE — Progress Notes (Signed)
Pt. Refused CPAP at this time. Encouraged pt to call if he would like to wear it.

## 2013-04-14 NOTE — Care Management Note (Addendum)
    Page 1 of 1   04/16/2013     10:49:40 AM   CARE MANAGEMENT NOTE 04/16/2013  Patient:  James Schultz, James Schultz   Account Number:  [id number]  Date Initiated:  04/13/2013  Documentation initiated by:  [facility]  Subjective/Objective Assessment:   62 year old male admitted with closed hip fracture and underwent repair.     Action/Plan:   From home.   Anticipated DC Date:  04/16/2013   Anticipated DC Plan:  SKILLED NURSING FACILITY      DC Planning Services  CM consult      Choice offered to / List presented to:             Status of service:  Completed, signed off Medicare Important Message given?  NA - LOS <3 / Initial given by admissions (If response is "NO", the following Medicare IM given date fields will be blank) Date Medicare IM given:   Date Additional Medicare IM given:    Discharge Disposition:  SKILLED NURSING FACILITY  Per UR Regulation:  Reviewed for med. necessity/level of care/duration of stay  If discussed at Long Length of Stay Meetings, dates discussed:    Comments:  04/16/13 James Remmert RN,BSN NCM [phone] D/C SNF.  04/14/13 James Frisinger RN,BSN NCM WEEKEND [phone] POD#1 R HIP PINNING.PT-SNF.D/C PLAN SNF.

## 2013-04-14 NOTE — Progress Notes (Signed)
[facility] PROGRESS NOTE  James Schultz:[id number] DOB: Jun 19, 1951 DOA: 04/12/2013 PCP: Ignatius Specking., MD  Assessment/Plan: 1. R hip fracture -s/p Hip pinning 2023/04/30 -DVT proph: lovenox -Physical therapy  -Urine output good, labs stable  2. Parkinson's disease -continue sinemet and pramipexole  3. HTN -stable, ACE on hold  4. DVT proph: lovenox  Code Status: Full Family Communication: d/w pt and friend at bedside Disposition Plan: SNF-rehab Tuesday   Consultants:  ORtho Dr.Beane  Antibiotics:  Peri-op ancef  HPI/Subjective: Doing ok, waiting for PT Objective: Filed Vitals:   04/14/13 1157  BP:   Pulse:   Temp:   Resp: 18    Intake/Output Summary (Last 24 hours) at 04/14/13 1351 Last data filed at 04/14/13 1000  Gross per 24 hour  Intake   2305 ml  Output   1760 ml  Net    545 ml   Filed Weights   04/12/13 2342  Weight: 112.2 kg (247 lb 5.7 oz)    Exam:   General:  AAOx3  Cardiovascular:S1S2/RRR  Respiratory: CTAB  Abdomen:soft, NT, BS present  Musculoskeletal: no edema c/c  Surgical site with dressing CDI  Data Reviewed: Basic Metabolic Panel:  Recent Labs Lab 04-29-13 0050 04/14/13 0535  NA 139 137  K 3.6 3.8  CL 104 103  CO2 28 29  GLUCOSE 98 97  BUN 11 8  CREATININE 0.72 0.75  CALCIUM 8.8 8.7   Liver Function Tests:  Recent Labs Lab Apr 29, 2013 0050  AST 14  ALT 7  ALKPHOS 73  BILITOT 0.4  PROT 6.3  ALBUMIN 3.2*   No results found for this basename: LIPASE, AMYLASE,  in the last 168 hours No results found for this basename: AMMONIA,  in the last 168 hours CBC:  Recent Labs Lab 29-Apr-2013 0050 04/14/13 0535  WBC 8.0 7.0  HGB 11.3* 11.2*  HCT 34.3* 34.1*  MCV 82.9 83.4  PLT 176 168   Cardiac Enzymes: No results found for this basename: CKTOTAL, CKMB, CKMBINDEX, TROPONINI,  in the last 168 hours BNP (last 3 results) No results found for this basename: PROBNP,  in the last 8760 hours CBG: No  results found for this basename: GLUCAP,  in the last 168 hours  Recent Results (from the past 240 hour(s))  MRSA PCR SCREENING     Status: None   Collection Time    04/12/13 11:59 PM      Result Value Range Status   MRSA by PCR NEGATIVE  NEGATIVE Final   Comment:            The GeneXpert MRSA Assay (FDA     approved for NASAL specimens     only), is one component of a     comprehensive MRSA colonization     surveillance program. It is not     intended to diagnose MRSA     infection nor to guide or     monitor treatment for     MRSA infections.     Studies: Dg Hip Operative Right  29-Apr-2013   *RADIOLOGY REPORT*  Clinical Data: Right femoral fracture  DG OPERATIVE RIGHT HIP  Comparison: 04/12/2013  Findings: Two spot films were obtained intraoperatively and reveal three compression screws traversing the known femoral neck fracture.  Fracture fragments are in anatomic alignment.  42 seconds of fluoroscopy was utilized.   Original Report Authenticated By: Alcide Clever, M.D.   Dg Pelvis Portable  Apr 29, 2013   *RADIOLOGY REPORT*  Clinical Data: Hip fracture fixation  PORTABLE PELVIS  Comparison: 04/12/2013  Findings: Three pins are present across the right femoral neck fracture.  Satisfactory alignment of the fracture.  Hardware in good position.  Mild degenerative change in the right hip joint.  IMPRESSION: Satisfactory fracture fixation of right femoral neck.   Original Report Authenticated By: Janeece Riggers, M.D.    Scheduled Meds: . buPROPion  150 mg Oral BID  . carbidopa-levodopa  1 tablet Oral 5 X Daily  . enoxaparin (LOVENOX) injection  40 mg Subcutaneous Q24H  . ferrous sulfate  325 mg Oral BID PC  . pramipexole  0.5 mg Oral Custom  . pramipexole  1 mg Oral Q24H  . rasagiline  1 mg Oral q morning - 10a   Continuous Infusions:    Principal Problem:   Closed right hip fracture Active Problems:   OA (osteoarthritis) of knee   Unspecified essential hypertension   Parkinson's  disease    Time spent:    [facility]  [facility] Pager [phone]. If 7PM-7AM, please contact night-coverage at [email or web address], password [facility] 04/14/2013, 1:51 PM  LOS: 2 days

## 2013-04-15 DIAGNOSIS — M1710 Unilateral primary osteoarthritis, unspecified knee: Secondary | ICD-10-CM

## 2013-04-15 LAB — CBC
Hemoglobin: 11.5 g/dL — ABNORMAL LOW (ref 13.0–17.0)
MCH: 27.1 pg (ref 26.0–34.0)
Platelets: 167 10*3/uL (ref 150–400)
RBC: 4.25 MIL/uL (ref 4.22–5.81)
WBC: 7.6 10*3/uL (ref 4.0–10.5)

## 2013-04-15 LAB — BASIC METABOLIC PANEL
CO2: 29 mEq/L (ref 19–32)
Calcium: 8.7 mg/dL (ref 8.4–10.5)
Glucose, Bld: 95 mg/dL (ref 70–99)
Potassium: 3.8 mEq/L (ref 3.5–5.1)
Sodium: 138 mEq/L (ref 135–145)

## 2013-04-15 NOTE — Progress Notes (Signed)
   Subjective: 2 Days Post-Op Procedure(s) (LRB): CANNULATED HIP PINNING (Right)   Patient reports pain as mild, pain controlled. Discussion had about placement to a SNF, and the extra PT able to be used to get him back up to speed.    Objective:   VITALS:   Filed Vitals:   04/14/13 2125  BP: 137/74  Pulse: 78  Temp: 98.5 F (36.9 C)   Resp: 16    Neurovascular intact Dorsiflexion/Plantar flexion intact Incision: scant drainage No cellulitis present Compartment soft  LABS  Recent Labs  04/13/13 0050 04/14/13 0535 04/15/13 0435  HGB 11.3* 11.2* 11.5*  HCT 34.3* 34.1* 34.9*  WBC 8.0 7.0 7.6  PLT 176 168 167     Recent Labs  04/13/13 0050 04/14/13 0535 04/15/13 0435  NA 139 137 138  K 3.6 3.8 3.8  BUN 11 8 10   CREATININE 0.72 0.75 0.71  GLUCOSE 98 97 95     Assessment/Plan: 2 Days Post-Op Procedure(s) (LRB): CANNULATED HIP PINNING (Right)  Up with therapy Discharge to SNF eventually when ready    Anastasio Auerbach. Dovid Bartko   PAC  04/15/2013, 10:10 AM

## 2013-04-15 NOTE — Evaluation (Signed)
Occupational Therapy Evaluation Patient Details Name: DERIAN DIMALANTA MRN: [id number] DOB: [birth year] Today's Date: 04/15/2013 Time: 4098-1191 OT Time Calculation (min): 24 min  OT Assessment / Plan / Recommendation History of present illness LTKA on 03/04/13. Pt has H/O Parkinson's Disease. NOw s/p right hip pinning   Clinical Impression   Pt fell and sustained a R hip fx which was pinned.  He is allowed 10% weight bearing.  PMH significant for  LTKA on 7/21 and Parkinson's Disease.  Pt will benefit from skilled OT to increase safety and independence with adls.      OT Assessment  Patient needs continued OT Services    Follow Up Recommendations  SNF (Pt wants [facility]).   Barriers to Discharge      Equipment Recommendations  None recommended by OT    Recommendations for Other Services    Frequency  Min 2X/week    Precautions / Restrictions Precautions Precautions: Fall;Knee Restrictions Weight Bearing Restrictions: Yes RLE Weight Bearing: Touchdown weight bearing RLE Partial Weight Bearing Percentage or Pounds: 10%   Pertinent Vitals/Pain 5/10 R hip.  Premedicated and repositioned    ADL  Grooming: Set up Where Assessed - Grooming: Supported sitting Upper Body Bathing: Set up Where Assessed - Upper Body Bathing: Supported sitting Lower Body Bathing: +2 Total assistance Lower Body Bathing: Patient Percentage: 20% Where Assessed - Lower Body Bathing: Supported sit to stand Upper Body Dressing: Minimal assistance Where Assessed - Upper Body Dressing: Supported sitting Lower Body Dressing: +2 Total assistance Lower Body Dressing: Patient Percentage: 0% Where Assessed - Lower Body Dressing: Supported sit to Pharmacist, hospital: +2 Total assistance Toilet Transfer: Patient Percentage: 10% Statistician Method: Surveyor, minerals:  (bed to chair) Toileting - Architect and Hygiene: +2 Total assistance Toileting - Clothing  Manipulation and Hygiene: Patient Percentage: 40% Equipment Used: Rolling walker;Gait belt Transfers/Ambulation Related to ADLs: spt only to chair; unable to turn heel; difficulty stepping Pt able to maintain TDWB on R ADL Comments: needs extensive assistance with LB adls.      OT Diagnosis: Generalized weakness  OT Problem List: Decreased strength;Decreased activity tolerance;Impaired balance (sitting and/or standing);Decreased knowledge of use of DME or AE;Decreased knowledge of precautions;Pain OT Treatment Interventions: Self-care/ADL training;DME and/or AE instruction;Therapeutic activities;Patient/family education;Balance training   OT Goals(Current goals can be found in the care plan section) Acute Rehab OT Goals Patient Stated Goal: get back to being independent; make sure knee doesn't lock up OT Goal Formulation: With patient Time For Goal Achievement: 04/22/13 Potential to Achieve Goals: Good ADL Goals Pt Will Transfer to Toilet: with +2 assist;stand pivot transfer;bedside commode (pt 50%) Additional ADL Goal #1: Pt will go sit to stand with mod A and maintain TDWB and balance with min A for 2 minutes for adls.  Additional ADL Goal #2: Pt will use AE from seated level with min A  for LB adls  Visit Information  Last OT Received On: 04/15/13 Assistance Needed: +2 History of Present Illness: LTKA on 03/04/13. Pt has H/O Parkinson's Disease. NOw s/p right hip pinning       Prior Functioning     Home Living Home Equipment: Walker - 2 wheels;Cane - single point;Bedside commode Additional Comments: wife has h/o back sx; can't lift.  Pt will need snf Prior Function Comments: pt was amb with cane and still getting HHPT at home Communication Communication: No difficulties         Vision/Perception     Cognition  Extremity/Trunk Assessment Upper Extremity Assessment Upper Extremity Assessment: Overall WFL for tasks assessed (slight tremor due to Parkinson)      Mobility Bed Mobility Supine to Sit: 1: +2 Total assist Supine to Sit: Patient Percentage: 30% Details for Bed Mobility Assistance: assist with legs and trunk Transfers Sit to Stand: 1: +2 Total assist Sit to Stand: Patient Percentage: 40% Details for Transfer Assistance: assist for trunk; cues for ue/le position     Exercise     Balance Static Sitting Balance Static Sitting - Balance Support: Bilateral upper extremity supported Static Sitting - Level of Assistance: 5: Stand by assistance   End of Session OT - End of Session Activity Tolerance: Patient limited by pain;Patient limited by fatigue Patient left: in chair;with call bell/phone within reach;with chair alarm set Nurse Communication: Mobility status;Need for lift equipment  GO     Korina Tretter 04/15/2013, 12:09 PM Marica Otter, OTR/L [phone] 04/15/2013

## 2013-04-15 NOTE — Clinical Social Work Psychosocial (Signed)
     Clinical Social Work Department BRIEF PSYCHOSOCIAL ASSESSMENT 04/15/2013  Patient:  James Schultz, James Schultz     Account Number:  [id number]     Admit date:  04/12/2013  Clinical Social Worker:  Hattie Perch  Date/Time:  04/15/2013 12:00 M  Referred by:  Physician  Date Referred:  04/15/2013 Referred for  SNF Placement   Other Referral:   Interview type:  Patient Other interview type:   spouse at bedside.    PSYCHOSOCIAL DATA Living Status:  FAMILY Admitted from facility:   Level of care:   Primary support name:  [place] Primary support relationship to patient:  SPOUSE Degree of support available:   good    CURRENT CONCERNS Current Concerns  Post-Acute Placement   Other Concerns:    SOCIAL WORK ASSESSMENT / PLAN CSW met with patient and spouse at bedside. patient is alert and oriented X3. patient in need of snf placement. patient had hip repair and just three weeks post op from a total knee replacement. they are agreeable to snf and request to be faxed out and recieve bed offers.   Assessment/plan status:   Other assessment/ plan:   Information/referral to community resources:    PATIENTS/FAMILYS RESPONSE TO PLAN OF CARE: patient is anxious due to recent knee replacement and now hip fracture. he knows he needs rehap and his wife is very supportive.

## 2013-04-15 NOTE — Progress Notes (Signed)
[facility] PROGRESS NOTE  James Schultz WGN:[id number] DOB: 06/16/1951 DOA: 04/12/2013 PCP: Ignatius Specking., MD  Assessment/Plan: 1. R hip fracture -s/p Hip pinning 8/30 -DVT proph: lovenox -Physical therapy  -Urine output good, labs stable  2. Parkinson's disease -continue sinemet and pramipexole  3. HTN -stable, ACE on hold  4. DVT proph: lovenox  Code Status: Full Family Communication: d/w pt and friend at bedside Disposition Plan: SNF-rehab Tuesday   Consultants:  ORtho Dr.Beane  Antibiotics:  Peri-op ancef  HPI/Subjective: Doing ok, only tried to stand up with PT yesterday  Objective: Filed Vitals:   04/15/13 1445  BP: 91/66  Pulse: 76  Temp: 98.1 F (36.7 C)  Resp: 20    Intake/Output Summary (Last 24 hours) at 04/15/13 1501 Last data filed at 04/15/13 1300  Gross per 24 hour  Intake   1645 ml  Output   1675 ml  Net    -30 ml   Filed Weights   04/12/13 2342  Weight: 112.2 kg (247 lb 5.7 oz)    Exam:   General:  AAOx3  Cardiovascular:S1S2/RRR  Respiratory: CTAB  Abdomen:soft, NT, BS present  Musculoskeletal: no edema c/c  Surgical site with dressing CDI  Data Reviewed: Basic Metabolic Panel:  Recent Labs Lab 04/13/13 0050 04/14/13 0535 04/15/13 0435  NA 139 137 138  K 3.6 3.8 3.8  CL 104 103 103  CO2 28 29 29   GLUCOSE 98 97 95  BUN 11 8 10   CREATININE 0.72 0.75 0.71  CALCIUM 8.8 8.7 8.7   Liver Function Tests:  Recent Labs Lab 04/13/13 0050  AST 14  ALT 7  ALKPHOS 73  BILITOT 0.4  PROT 6.3  ALBUMIN 3.2*   No results found for this basename: LIPASE, AMYLASE,  in the last 168 hours No results found for this basename: AMMONIA,  in the last 168 hours CBC:  Recent Labs Lab 04/13/13 0050 04/14/13 0535 04/15/13 0435  WBC 8.0 7.0 7.6  HGB 11.3* 11.2* 11.5*  HCT 34.3* 34.1* 34.9*  MCV 82.9 83.4 82.1  PLT 176 168 167   Cardiac Enzymes: No results found for this basename: CKTOTAL, CKMB, CKMBINDEX,  TROPONINI,  in the last 168 hours BNP (last 3 results) No results found for this basename: PROBNP,  in the last 8760 hours CBG: No results found for this basename: GLUCAP,  in the last 168 hours  Recent Results (from the past 240 hour(s))  MRSA PCR SCREENING     Status: None   Collection Time    04/12/13 11:59 PM      Result Value Range Status   MRSA by PCR NEGATIVE  NEGATIVE Final   Comment:            The GeneXpert MRSA Assay (FDA     approved for NASAL specimens     only), is one component of a     comprehensive MRSA colonization     surveillance program. It is not     intended to diagnose MRSA     infection nor to guide or     monitor treatment for     MRSA infections.     Studies: No results found.  Scheduled Meds: . buPROPion  150 mg Oral BID  . carbidopa-levodopa  1 tablet Oral 5 X Daily  . enoxaparin (LOVENOX) injection  40 mg Subcutaneous Q24H  . ferrous sulfate  325 mg Oral BID PC  . pramipexole  0.5 mg Oral Custom  . pramipexole  1 mg  Oral Q24H  . rasagiline  1 mg Oral q morning - 10a   Continuous Infusions:    Principal Problem:   Closed right hip fracture Active Problems:   OA (osteoarthritis) of knee   Unspecified essential hypertension   Parkinson's disease    Time spent:    [facility]  [facility] Pager [phone]. If 7PM-7AM, please contact night-coverage at [email or web address], password [facility] 04/15/2013, 3:01 PM  LOS: 3 days

## 2013-04-15 NOTE — Progress Notes (Signed)
Physical Therapy Treatment Patient Details Name: James Schultz MRN: [id number] DOB: 03/25/1951 Today's Date: 04/15/2013 Time:  -     PT Assessment / Plan / Recommendation  History of Present Illness LTKA on 03/04/13. Pt has H/O Parkinson's Disease. NOw s/p right hip pinning   PT Comments   POD # 2 R hip pinning 2nd fall/fx.  Also recent L TKR and Hx Parkinson's.  Assisted pt OOB to recliner + 2 assist.  Pt progressing slowly and plans to D/C to [facility] for [facility] Rehab.    Follow Up Recommendations   SNF     Does the patient have the potential to tolerate intense rehabilitation     Barriers to Discharge        Equipment Recommendations       Recommendations for Other Services    Frequency     Progress towards PT Goals    Plan      Precautions / Restrictions Precautions Precautions: Fall;Knee Precaution Comments: parkinson's; LTKA ~48month ago Restrictions Weight Bearing Restrictions: Yes RLE Weight Bearing: Touchdown weight bearing RLE Partial Weight Bearing Percentage or Pounds: 10%    Pertinent Vitals/Pain C/o 7/10 during act Pre medicated ICE applied    Mobility  Bed Mobility Bed Mobility: Supine to Sit;Sitting - Scoot to Edge of Bed Supine to Sit: 1: +2 Total assist Supine to Sit: Patient Percentage: 30% Sitting - Scoot to Edge of Bed: 1: +2 Total assist Sitting - Scoot to Edge of Bed: Patient Percentage: 10% Details for Bed Mobility Assistance: assist with legs and trunk Transfers Transfers: Sit to Stand;Stand to Sit;Stand Pivot Transfers Sit to Stand: 1: +2 Total assist;From bed Sit to Stand: Patient Percentage: 40% Stand to Sit: 1: +2 Total assist;To chair/3-in-1 Stand Pivot Transfers: 1: +2 Total assist Stand Pivot Transfers: Patient Percentage: 20% Details for Transfer Assistance: greatest difficulty pivoting 1/4 turn from bed to recliner while 10% WB thru R LE.  Great difficulty initiating mvts 2nd Parkinson's.  Ambulation/Gait Ambulation/Gait  Assistance Details: unable to attempt 2nd difficulty with transfer    Exercises  10 reps AP B LE 10 reps knee presses B LE 10 reps heel slides B LE AAROM > R  10 reps ABD B LE AAROM > R    PT Goals (current goals can now be found in the care plan section) Acute Rehab PT Goals Patient Stated Goal: get back to being independent; make sure knee doesn't lock up  Visit Information  Last PT Received On: 04/15/13 Assistance Needed: +2 History of Present Illness: LTKA on 03/04/13. Pt has H/O Parkinson's Disease. NOw s/p right hip pinning    Subjective Data  Patient Stated Goal: get back to being independent; make sure knee doesn't lock up   Cognition       Balance  Static Sitting Balance Static Sitting - Balance Support: Bilateral upper extremity supported Static Sitting - Level of Assistance: 5: Stand by assistance  End of Session     Felecia Shelling  PTA [facility]  Acute  Rehab Pager      [phone]

## 2013-04-15 NOTE — Progress Notes (Signed)
Patient states that our CPAP masks are smothering and that's why he does not want to wear the CPAP. He stated that he would get his wife to bring his nasal pillow mask tomorrow then he would wear it. Will let oncoming RT know.

## 2013-04-16 ENCOUNTER — Encounter (HOSPITAL_COMMUNITY): Payer: Self-pay | Admitting: Specialist

## 2013-04-16 DIAGNOSIS — G20 Parkinson's disease: Secondary | ICD-10-CM

## 2013-04-16 DIAGNOSIS — I10 Essential (primary) hypertension: Secondary | ICD-10-CM

## 2013-04-16 DIAGNOSIS — M1710 Unilateral primary osteoarthritis, unspecified knee: Secondary | ICD-10-CM

## 2013-04-16 DIAGNOSIS — S72009A Fracture of unspecified part of neck of unspecified femur, initial encounter for closed fracture: Secondary | ICD-10-CM

## 2013-04-16 LAB — CBC
Hemoglobin: 11.9 g/dL — ABNORMAL LOW (ref 13.0–17.0)
MCH: 27.7 pg (ref 26.0–34.0)
MCV: 82.1 fL (ref 78.0–100.0)
Platelets: 201 10*3/uL (ref 150–400)
RBC: 4.3 MIL/uL (ref 4.22–5.81)
WBC: 7.5 10*3/uL (ref 4.0–10.5)

## 2013-04-16 MED ORDER — BISACODYL 10 MG RE SUPP: 10.0000 mg | Freq: Every day | RECTAL | Status: DC | PRN

## 2013-04-16 MED ORDER — OXYCODONE HCL 5 MG PO TABS: 5.0000 mg | ORAL_TABLET | ORAL | Status: DC | PRN

## 2013-04-16 MED ORDER — LORAZEPAM 1 MG PO TABS: 1.0000 mg | ORAL_TABLET | Freq: Four times a day (QID) | ORAL | Status: DC | PRN

## 2013-04-16 MED ORDER — METHOCARBAMOL 500 MG PO TABS: 500.0000 mg | ORAL_TABLET | Freq: Four times a day (QID) | ORAL | Status: DC | PRN

## 2013-04-16 MED ORDER — ENOXAPARIN SODIUM 30 MG/0.3ML ~~LOC~~ SOLN: 40.0000 mg | SUBCUTANEOUS | Status: DC

## 2013-04-16 NOTE — Discharge Summary (Signed)
Physician Discharge Summary  James Schultz ZOX:[id number] DOB: 1951-04-17 DOA: 04/12/2013  PCP: Ignatius Specking., MD  Admit date: 04/12/2013 Discharge date: 04/16/2013  Time spent: 45 minutes  Recommendations for Outpatient Follow-up:  1. Dr.Beane in 10-14days 2. Stop Lovenox in 2-3 weeks  Discharge Diagnoses:  Principal Problem:   Closed right hip fracture Active Problems:   OA (osteoarthritis) of knee   Unspecified essential hypertension   Parkinson's disease   Discharge Condition: stable  Diet recommendation:low sodium  Filed Weights   04/12/13 2342  Weight: 112.2 kg (247 lb 5.7 oz)    History of present illness:  James Schultz is a 62 y.o. male with Past medical history of Parkinson's disease, sleep apnea, hypertension.  He presented today with the complaint of a fall. He was at his baseline when around the evening when he was standing from the chair and turn around he had some stuttering of his gait and he fell down on the tile floor. He denies any chest pain, palpitation, dizziness, lightheadedness, syncope, near syncope, in control of bowel or bladder during the episode. The wife denies any confusion. Patient denies any other injuries anywhere else. He denies any headache, neck pain, back pain.  Into the ER in [facility] he was found to have right-sided hip fracture.  He recently had total knee arthroplasty on the left side.   Hospital Course:  1. R hip fracture -s/p Hip pinning 8/30 by Dr.Beane -DVT proph: lovenox  -Physical therapy following, challenging given recent knee surgery -Urine output good, labs stable   2. Parkinson's disease  -stable -continue sinemet and pramipexole   3. HTN  -stable, ACE was on hold  -can be resumed now  4. DVT proph: lovenox for 2-3 weeks more   Procedures:  Consultations:  Ortho Dr.BEane  Discharge Exam: Filed Vitals:   04/16/13 0647  BP: 156/81  Pulse: 73  Temp: 97.9 F (36.6 C)  Resp: 18     General: AAOx3 Cardiovascular: S1S2/RRR Respiratory: CTAB  Discharge Instructions  Discharge Orders   Future Orders Complete By Expires   Diet general  As directed    Increase activity slowly  As directed    Touch down weight bearing  As directed    Comments:     Right leg       Medication List    STOP taking these medications       docusate sodium 100 MG capsule  Commonly known as:  COLACE     rivaroxaban 10 MG Tabs tablet  Commonly known as:  XARELTO      TAKE these medications       aspirin EC 81 MG tablet  Take 81 mg by mouth every morning.     AZILECT 1 MG Tabs tablet  Generic drug:  rasagiline  Take 1 mg by mouth every morning.     bisacodyl 10 MG suppository  Commonly known as:  DULCOLAX  Place 1 suppository (10 mg total) rectally daily as needed.     buPROPion 150 MG 12 hr tablet  Commonly known as:  WELLBUTRIN SR  Take 150 mg by mouth 2 (two) times daily.     carbidopa-levodopa 25-100 MG per tablet  Commonly known as:  SINEMET IR  Take 1 tablet by mouth 5 (five) times daily.     enoxaparin 30 MG/0.3ML injection  Commonly known as:  LOVENOX  Inject 0.4 mLs (40 mg total) into the skin daily. For 2-3wees     lisinopril 10 MG  tablet  Commonly known as:  PRINIVIL,ZESTRIL  Take 10 mg by mouth every morning.     LORazepam 1 MG tablet  Commonly known as:  ATIVAN  Take 1 tablet (1 mg total) by mouth 4 (four) times daily as needed for anxiety.     methocarbamol 500 MG tablet  Commonly known as:  ROBAXIN  Take 1 tablet (500 mg total) by mouth every 6 (six) hours as needed.     methocarbamol 500 MG tablet  Commonly known as:  ROBAXIN  Take 500 mg by mouth every 6 (six) hours as needed (muscle spasm).     oxyCODONE 5 MG immediate release tablet  Commonly known as:  Oxy IR/ROXICODONE  Take 1-2 tablets (5-10 mg total) by mouth every 4 (four) hours as needed for pain.     pramipexole 1 MG tablet  Commonly known as:  MIRAPEX  Take 0.5-1 mg by  mouth 3 (three) times daily. Takes 1 tablet in the morning 6am and half tablet at 10 am and half tablet at 2 pm       No Known Allergies     Follow-up Information   Follow up with BEANE,JEFFREY C, MD In 2 weeks.   Specialty:  Orthopedic Surgery   Contact information:   [address] [place] [address] [phone]        The results of significant diagnostics from this hospitalization (including imaging, microbiology, ancillary and laboratory) are listed below for reference.    Significant Diagnostic Studies: Dg Hip Operative Right  May 02, 2013   *RADIOLOGY REPORT*  Clinical Data: Right femoral fracture  DG OPERATIVE RIGHT HIP  Comparison: 04/12/2013  Findings: Two spot films were obtained intraoperatively and reveal three compression screws traversing the known femoral neck fracture.  Fracture fragments are in anatomic alignment.  42 seconds of fluoroscopy was utilized.   Original Report Authenticated By: Alcide Clever, M.D.   Dg Pelvis Portable  2013/05/02   *RADIOLOGY REPORT*  Clinical Data: Hip fracture fixation  PORTABLE PELVIS  Comparison: 04/12/2013  Findings: Three pins are present across the right femoral neck fracture.  Satisfactory alignment of the fracture.  Hardware in good position.  Mild degenerative change in the right hip joint.  IMPRESSION: Satisfactory fracture fixation of right femoral neck.   Original Report Authenticated By: Janeece Riggers, M.D.    Microbiology: Recent Results (from the past 240 hour(s))  MRSA PCR SCREENING     Status: None   Collection Time    04/12/13 11:59 PM      Result Value Range Status   MRSA by PCR NEGATIVE  NEGATIVE Final   Comment:            The GeneXpert MRSA Assay (FDA     approved for NASAL specimens     only), is one component of a     comprehensive MRSA colonization     surveillance program. It is not     intended to diagnose MRSA     infection nor to guide or     monitor treatment for     MRSA  infections.     Labs: Basic Metabolic Panel:  Recent Labs Lab May 02, 2013 0050 04/14/13 0535 04/15/13 0435  NA 139 137 138  K 3.6 3.8 3.8  CL 104 103 103  CO2 28 29 29   GLUCOSE 98 97 95  BUN 11 8 10   CREATININE 0.72 0.75 0.71  CALCIUM 8.8 8.7 8.7   Liver Function Tests:  Recent Labs Lab 05-02-2013  0050  AST 14  ALT 7  ALKPHOS 73  BILITOT 0.4  PROT 6.3  ALBUMIN 3.2*   No results found for this basename: LIPASE, AMYLASE,  in the last 168 hours No results found for this basename: AMMONIA,  in the last 168 hours CBC:  Recent Labs Lab 04/13/13 0050 04/14/13 0535 04/15/13 0435 04/16/13 0440  WBC 8.0 7.0 7.6 7.5  HGB 11.3* 11.2* 11.5* 11.9*  HCT 34.3* 34.1* 34.9* 35.3*  MCV 82.9 83.4 82.1 82.1  PLT 176 168 167 201   Cardiac Enzymes: No results found for this basename: CKTOTAL, CKMB, CKMBINDEX, TROPONINI,  in the last 168 hours BNP: BNP (last 3 results) No results found for this basename: PROBNP,  in the last 8760 hours CBG: No results found for this basename: GLUCAP,  in the last 168 hours     Signed:  Kashif Pooler  [facility] 04/16/2013, 11:55 AM

## 2013-04-16 NOTE — Progress Notes (Signed)
Discussed choices with patient and spouse. They are requesting [facility]. Notified [facility].  Kaylyne Axton C. Korri Ask MSW, LCSW [phone]

## 2013-04-16 NOTE — Clinical Social Work Placement (Signed)
     Clinical Social Work Department CLINICAL SOCIAL WORK PLACEMENT NOTE 04/16/2013  Patient:  James Schultz, James Schultz  Account Number:  [id number] Admit date:  04/12/2013  Clinical Social Worker:  Becky Sax, LCSW  Date/time:  04/15/2013 12:00 M  Clinical Social Work is seeking post-discharge placement for this patient at the following level of care:   SKILLED NURSING   (*CSW will update this form in Epic as items are completed)   04/15/2013  Patient/family provided with [facility] Department of Clinical Social Works list of facilities offering this level of care within the geographic area requested by the patient (or if unable, by the patients family).  04/15/2013  Patient/family informed of their freedom to choose among providers that offer the needed level of care, that participate in Medicare, Medicaid or managed care program needed by the patient, have an available bed and are willing to accept the patient.  04/15/2013  Patient/family informed of [facility] ownership interest in [facility], as well as of the fact that they are under no obligation to receive care at this facility.  PASARR submitted to EDS on 04/15/2013 PASARR number received from EDS on 04/15/2013  FL2 transmitted to all facilities in geographic area requested by pt/family on  04/15/2013 FL2 transmitted to all facilities within larger geographic area on   Patient informed that his/her managed care company has contracts with or will negotiate with  certain facilities, including the following:     Patient/family informed of bed offers received:  04/16/2013 Patient chooses bed at [facility] Physician recommends and patient chooses bed at  [facility]  Patient to be transferred to [facility] on  04/16/2013 Patient to be transferred to facility by [facility]tar  The following physician request were entered in Epic:   Additional Comments:

## 2013-04-16 NOTE — Progress Notes (Signed)
Subjective: 3 Days Post-Op Procedure(s) (LRB): CANNULATED HIP PINNING (Right) Patient reports pain as mild.  Well controlled at rest, notes lateral R hip pain with movement but no groin pain. No other c/o. He is nervous about losing motion in his left knee given recent TKA. No other c/o.  Objective: Vital signs in last 24 hours: Temp:  [97.9 F (36.6 C)-98.4 F (36.9 C)] 97.9 F (36.6 C) (09/02 0647) Pulse Rate:  [73-77] 73 (09/02 0647) Resp:  [18-20] 18 (09/02 0647) BP: (91-156)/(66-81) 156/81 mmHg (09/02 0647) SpO2:  [95 %-100 %] 100 % (09/02 0647)  Intake/Output from previous day: 09/01 0701 - 09/02 0700 In: 960 [P.O.:960] Out: 1975 [Urine:1975] Intake/Output this shift:     Recent Labs  04/14/13 0535 04/15/13 0435 04/16/13 0440  HGB 11.2* 11.5* 11.9*    Recent Labs  04/15/13 0435 04/16/13 0440  WBC 7.6 7.5  RBC 4.25 4.30  HCT 34.9* 35.3*  PLT 167 201    Recent Labs  04/14/13 0535 04/15/13 0435  NA 137 138  K 3.8 3.8  CL 103 103  CO2 29 29  BUN 8 10  CREATININE 0.75 0.71  GLUCOSE 97 95  CALCIUM 8.7 8.7   No results found for this basename: LABPT, INR,  in the last 72 hours  Neurologically intact ABD soft Neurovascular intact Sensation intact distally Intact pulses distally Dorsiflexion/Plantar flexion intact Incision: dressing C/D/I and no drainage No cellulitis present Compartment soft No calf pain or sign of DVT  Assessment/Plan: 3 Days Post-Op Procedure(s) (LRB): CANNULATED HIP PINNING (Right) Advance diet Up with therapy PWB RLE, continue ROM/strengthening L knee D/C to SNF when ready per PT recommendations and as long as medically stable Lovenox for DVT ppx on D/C Discussed D/C instructions Follow up with Dr. Shelle Iron 10-14 days post-op for staple removal and xrays  James Schultz M. 04/16/2013, 8:02 AM

## 2013-04-16 NOTE — Progress Notes (Signed)
Physical Therapy Treatment Patient Details Name: LUCION DILGER MRN: [id number] DOB: Aug 20, 1950 Today's Date: 04/16/2013 Time: 4098-1191 PT Time Calculation (min): 38 min  PT Assessment / Plan / Recommendation  History of Present Illness LTKA on 03/04/13. Pt has H/O Parkinson's Disease. NOw s/p right hip pinning 10% WB   PT Comments   POD # 3 am session.  Assisted pt from supine to EOB + 2 assist and increased time.  Assisted pt OOB to recliner + 2 assist with difficulty 2nd Hx Parkinson's and restricted 10% WB thru R LE.    Follow Up Recommendations  SNF     Does the patient have the potential to tolerate intense rehabilitation     Barriers to Discharge        Equipment Recommendations  None recommended by PT    Recommendations for Other Services    Frequency Min 3X/week   Progress towards PT Goals Progress towards PT goals: Progressing toward goals  Plan      Precautions / Restrictions Precautions Precaution Comments: parkinson's; LTKA ~58month ago Restrictions Weight Bearing Restrictions: Yes RLE Weight Bearing: Touchdown weight bearing RLE Partial Weight Bearing Percentage or Pounds: 10%    Pertinent Vitals/Pain C/o "alot of soreness"    Mobility  Bed Mobility Bed Mobility: Supine to Sit;Sitting - Scoot to Edge of Bed Supine to Sit: 1: +2 Total assist Supine to Sit: Patient Percentage: 30% Sitting - Scoot to Edge of Bed: 1: +2 Total assist Sitting - Scoot to Edge of Bed: Patient Percentage: 10% Details for Bed Mobility Assistance: assist with legs and trunk Transfers Transfers: Sit to Stand;Stand to Sit;Stand Pivot Transfers Sit to Stand: 1: +2 Total assist;From bed Sit to Stand: Patient Percentage: 40% Stand to Sit: 1: +2 Total assist;To chair/3-in-1 Stand to Sit: Patient Percentage: 30% Stand Pivot Transfers: 1: +2 Total assist Stand Pivot Transfers: Patient Percentage: 20% Details for Transfer Assistance: greatest difficulty pivoting 1/4 turn from bed to  recliner while 10% WB thru R LE.  Great difficulty initiating mvts 2nd Parkinson's.  Ambulation/Gait Ambulation/Gait Assistance Details: unable to attempt 2nd difficulty with transfer    Exercises  TE's to L LE 2nd recent TKR 10 reps AP B LE 10 reps heel slides B LE 10 reps LAQ's B LE 10 reps ABD B LE     PT Goals (current goals can now be found in the care plan section)    Visit Information  Last PT Received On: 04/16/13 Assistance Needed: +2 History of Present Illness: LTKA on 03/04/13. Pt has H/O Parkinson's Disease. NOw s/p right hip pinning 10% WB    Subjective Data      Cognition       Balance     End of Session PT - End of Session Equipment Utilized During Treatment: Gait belt Activity Tolerance: Patient tolerated treatment well Patient left: in chair;with call bell/phone within reach Nurse Communication: Mobility status;Need for lift equipment   Felecia Shelling  PTA [facility]  Acute  Rehab Pager      [phone]

## 2013-04-16 NOTE — Progress Notes (Signed)
Patient cleared for discharge. Packet copied and placed in [place]. ptar called for transportation. Wife understands no guarantee of payment. Patient is extremely happy about leaving today. No further CSW needs noted.  Toma Copier C. Printice Hellmer MSW, LCSW [phone]

## 2013-04-16 NOTE — Progress Notes (Signed)
Physical Therapy Treatment Patient Details Name: ALPHONS BURGERT MRN: [id number] DOB: 1950-08-16 Today's Date: 04/16/2013 Time: 0865-7846 PT Time Calculation (min): 10 min  PT Assessment / Plan / Recommendation  History of Present Illness LTKA on 03/04/13. Pt has H/O Parkinson's Disease. NOw s/p right hip pinning 10% WB   PT Comments   Assisted pt back to bed + 2 assist and 75% VC's on proper tech.   Follow Up Recommendations  SNF     Does the patient have the potential to tolerate intense rehabilitation     Barriers to Discharge        Equipment Recommendations  None recommended by PT    Recommendations for Other Services    Frequency Min 3X/week   Progress towards PT Goals Progress towards PT goals: Progressing toward goals  Plan      Precautions / Restrictions Precautions Precaution Comments: parkinson's; LTKA ~30month ago Restrictions Weight Bearing Restrictions: Yes RLE Weight Bearing: Touchdown weight bearing RLE Partial Weight Bearing Percentage or Pounds: 10%    Pertinent Vitals/Pain C/o "alot of soreness"   Mobility                                                             Assisted pt back to bed + 2 assist stand pivot sit with RW at 10% WB R LE with 75% VC's on proper tech and hand placement.  End of Session PT - End of Session Equipment Utilized During Treatment: Gait belt Activity Tolerance: Patient tolerated treatment well Patient left: in bed;with call bell/phone within reach Nurse Communication: Mobility status;Need for lift equipment   Felecia Shelling  PTA [facility]  Acute  Rehab Pager      [phone]

## 2013-04-16 NOTE — Progress Notes (Signed)
Discharge to [facility], p/u by PTAR, wife at bedside. Report given to Diane Rn. Patient is alert and oriented, no distress upon discharge. PIV removed nos/s of infection oe swelling noted on insertion site.Sx site dressing dry and intact.

## 2013-04-17 ENCOUNTER — Non-Acute Institutional Stay (SKILLED_NURSING_FACILITY): Payer: Medicare Other | Admitting: Adult Health

## 2013-04-17 DIAGNOSIS — F3289 Other specified depressive episodes: Secondary | ICD-10-CM

## 2013-04-17 DIAGNOSIS — I10 Essential (primary) hypertension: Secondary | ICD-10-CM

## 2013-04-17 DIAGNOSIS — S72009D Fracture of unspecified part of neck of unspecified femur, subsequent encounter for closed fracture with routine healing: Secondary | ICD-10-CM

## 2013-04-17 DIAGNOSIS — G20A1 Parkinson's disease without dyskinesia, without mention of fluctuations: Secondary | ICD-10-CM

## 2013-04-17 DIAGNOSIS — F329 Major depressive disorder, single episode, unspecified: Secondary | ICD-10-CM

## 2013-04-17 DIAGNOSIS — K5900 Constipation, unspecified: Secondary | ICD-10-CM

## 2013-04-17 DIAGNOSIS — S72001D Fracture of unspecified part of neck of right femur, subsequent encounter for closed fracture with routine healing: Secondary | ICD-10-CM

## 2013-04-17 DIAGNOSIS — G20 Parkinson's disease: Secondary | ICD-10-CM

## 2013-04-18 ENCOUNTER — Non-Acute Institutional Stay (SKILLED_NURSING_FACILITY): Payer: Medicare Other | Admitting: Internal Medicine

## 2013-04-18 DIAGNOSIS — S7291XS Unspecified fracture of right femur, sequela: Secondary | ICD-10-CM

## 2013-04-18 DIAGNOSIS — I10 Essential (primary) hypertension: Secondary | ICD-10-CM

## 2013-04-18 DIAGNOSIS — S8290XS Unspecified fracture of unspecified lower leg, sequela: Secondary | ICD-10-CM

## 2013-04-18 DIAGNOSIS — G20 Parkinson's disease: Secondary | ICD-10-CM

## 2013-04-18 DIAGNOSIS — F329 Major depressive disorder, single episode, unspecified: Secondary | ICD-10-CM

## 2013-05-13 ENCOUNTER — Non-Acute Institutional Stay (SKILLED_NURSING_FACILITY): Payer: Medicare Other | Admitting: Adult Health

## 2013-05-13 DIAGNOSIS — K5900 Constipation, unspecified: Secondary | ICD-10-CM

## 2013-05-13 DIAGNOSIS — S72009D Fracture of unspecified part of neck of unspecified femur, subsequent encounter for closed fracture with routine healing: Secondary | ICD-10-CM

## 2013-05-13 DIAGNOSIS — G20 Parkinson's disease: Secondary | ICD-10-CM

## 2013-05-13 DIAGNOSIS — I10 Essential (primary) hypertension: Secondary | ICD-10-CM

## 2013-05-13 DIAGNOSIS — S72001D Fracture of unspecified part of neck of right femur, subsequent encounter for closed fracture with routine healing: Secondary | ICD-10-CM

## 2013-05-13 DIAGNOSIS — F329 Major depressive disorder, single episode, unspecified: Secondary | ICD-10-CM

## 2013-05-14 DIAGNOSIS — F329 Major depressive disorder, single episode, unspecified: Secondary | ICD-10-CM | POA: Insufficient documentation

## 2013-05-14 DIAGNOSIS — S7290XA Unspecified fracture of unspecified femur, initial encounter for closed fracture: Secondary | ICD-10-CM | POA: Insufficient documentation

## 2013-05-14 DIAGNOSIS — I10 Essential (primary) hypertension: Secondary | ICD-10-CM | POA: Insufficient documentation

## 2013-05-14 DIAGNOSIS — G20 Parkinson's disease: Secondary | ICD-10-CM | POA: Insufficient documentation

## 2013-05-14 NOTE — Progress Notes (Signed)
Patient ID: James Schultz, male   DOB: 07-20-1951, 62 y.o.   MRN: [id number]        HISTORY & PHYSICAL  DATE: 04/18/2013   FACILITY: [facility]  LEVEL OF CARE: SNF (31)  ALLERGIES:  No Known Allergies  CHIEF COMPLAINT:  Manage right hip fracture, Parkinson's disease, and hypertension.    HISTORY OF PRESENT ILLNESS:  The patient is a 62 year-old, Caucasian male.    HIP FRACTURE: The patient had a mechanical fall and sustained a femur fracture.  Patient subsequently underwent surgical repair and tolerated the procedure well. Patient is admitted to this facility for short-term rehabilitation. Patient denies hip pain currently. No complications reported from the pain medications currently being used.    HTN: Pt 's HTN remains stable.  Denies CP, sob, DOE, pedal edema, headaches, dizziness or visual disturbances.  No complications from the medications currently being used.  Last BP :  120/72.    PARKINSON'S DISEASE: pt's Parkinson's disease is stable.  Denies progression of sx recently.  Pt is tolerating Parkinson's disease medications without any complications.    PAST MEDICAL HISTORY :  Past Medical History  Diagnosis Date  . Hypertension   . Anxiety   . Depression   . Parkinson's disease     lov note dr love 10-25-2011, lov note crae everywhere 02-19-2013 dr Azucena Cecil scott  . Headache(784.0)   . Overactive bladder   . Overactive bladder   . Spinal stenosis of lumbar region   . Dysrhythmia   . Sleep apnea     cpap, setting of 12, uses [place] prongs no mask used    PAST SURGICAL HISTORY: Past Surgical History  Procedure Laterality Date  . Back surgery  2010    lumbar  . Right knee arthroscopy  2009  . Total knee arthroplasty Left 03/04/2013    Procedure: LEFT TOTAL KNEE ARTHROPLASTY;  Surgeon: Loanne Drilling, MD;  Location: [facility];  Service: Orthopedics;  Laterality: Left;  . Hip pinning,cannulated Right 04/13/2013    Procedure: CANNULATED HIP PINNING;  Surgeon:  Javier Docker, MD;  Location: [facility];  Service: Orthopedics;  Laterality: Right;  PERCUTANEOUS CANNULATED RIGHT HIP PINNING ORIF right hip    SOCIAL HISTORY:  reports that he has never smoked. He has never used smokeless tobacco. He reports that he does not drink alcohol or use illicit drugs.  FAMILY HISTORY: None  CURRENT MEDICATIONS: Reviewed per MAR  REVIEW OF SYSTEMS:  See HPI otherwise 14 point ROS is negative.  PHYSICAL EXAMINATION  VS:  T 98.2       P 91      RR 18      BP 120/72      POX 97%        WT (Lb) 240.2    GENERAL: no acute distress, moderately obese body habitus EYES: conjunctivae normal, sclerae normal, normal eye lids MOUTH/THROAT: lips without lesions,no lesions in the mouth,tongue is without lesions,uvula elevates in midline NECK: supple, trachea midline, no neck masses, no thyroid tenderness, no thyromegaly LYMPHATICS: no LAN in the neck, no supraclavicular LAN RESPIRATORY: breathing is even & unlabored, BS CTAB CARDIAC: RRR, no murmur,no extra heart sounds EDEMA/VARICOSITIES:  +1 bilateral lower extremity edema  ARTERIAL:  pedal pulses +1  GI:  ABDOMEN: abdomen soft, normal BS, no masses, no tenderness  LIVER/SPLEEN: no hepatomegaly, no splenomegaly MUSCULOSKELETAL: HEAD: normal to inspection & palpation BACK: no kyphosis, scoliosis or spinal processes tenderness EXTREMITIES: LEFT UPPER EXTREMITY:  strength decreased, range of motion normal   RIGHT UPPER EXTREMITY: strength decreased, range of motion normal   LEFT LOWER EXTREMITY: strength intact, range of motion minimal   RIGHT LOWER EXTREMITY: strength intact, range of motion not tested due to surgery   PSYCHIATRIC: the patient is alert & oriented to person, affect & behavior appropriate NEUROLOGICAL: the patient has a resting tremor, greater on the left hand    LABS/RADIOLOGY: Pelvic x-ray showed satisfactory fracture fixation of right femoral neck.    MRSA by PCR negative.     BMP normal.     Albumin 3.2, otherwise liver profile normal.    Hemoglobin 11.9, MCV 82.1, otherwise CBC normal.    ASSESSMENT/PLAN:  Closed right hip fracture.  Status post pinning.  Continue rehabilitation.    Hypertension.  Well controlled.    Parkinson's disease.  Continue current medications.    Depression.  Patient and family would like Wellbutrin to be stopped.  Therefore, we will taper off.    Acute blood loss anemia.  Reassess.    Check CBC and BMP.    I spoke with patient's family at bedside regarding his medications.    I have reviewed patient's medical records received at admission/from hospitalization.  CPT CODE: 16109

## 2013-05-21 NOTE — Progress Notes (Signed)
Patient ID: James Schultz, male   DOB: 01-May-1951, 62 y.o.   MRN: [id number]       PROGRESS NOTE  DATE: 04/17/2013  FACILITY: Nursing Home Location: [facility]  LEVEL OF CARE: SNF (31)  Acute Visit  CHIEF COMPLAINT:  Follow-up hospitalization  HISTORY OF PRESENT ILLNESS: This is a 62 year old male who has been admitted to [facility] on 04/16/13 from [facility]. He had a fall and sustained a right hip fracture S/P hip pinning. He has been admitted for a short-term rehabilitation.  REASSESSMENT OF ONGOING PROBLEM(S):  HTN: Pt 's HTN remains stable.  Denies CP, sob, DOE, pedal edema, headaches, dizziness or visual disturbances.  No complications from the medications currently being used.  Last BP : 133/83  DEPRESSION: The depression remains stable. Patient denies ongoing feelings of sadness, insomnia, anedhonia or lack of appetite. No complications reported from the medications currently being used. Staff do not report behavioral problems.  PARKINSON'S DISEASE: pt's Parkinson's disease is stable.  Denies progression of sx recently.  Pt is tolerating Parkinson's disease medications without any complications.   PAST MEDICAL HISTORY : Reviewed.  No changes.  CURRENT MEDICATIONS: Reviewed per [facility]  REVIEW OF SYSTEMS:  GENERAL: no change in appetite, no fatigue, no weight changes, no fever, chills or weakness RESPIRATORY: no cough, SOB, DOE, wheezing, hemoptysis CARDIAC: no chest pain, edema or palpitations GI: no abdominal pain, diarrhea, heart burn, nausea or vomiting, +constipation  PHYSICAL EXAMINATION  VS:  T 98.1       P 78      RR 18      BP 133/83         WT 240.2 (Lb)  GENERAL: no acute distress, normal body habitus EYES: conjunctivae normal, sclerae normal, normal eye lids NECK: supple, trachea midline, no neck masses, no thyroid tenderness, no thyromegaly LYMPHATICS: no LAN in the neck, no supraclavicular LAN RESPIRATORY: breathing is even &  unlabored, BS CTAB CARDIAC: RRR, no murmur,no extra heart sounds, no edema GI: abdomen soft, normal BS, no masses, no tenderness, no hepatomegaly, no splenomegaly PSYCHIATRIC: the patient is alert & oriented to person, affect & behavior appropriate  LABS/RADIOLOGY: 04/15/13 sodium 138 potassium 3.8 glucose 95 BUN 10 creatinine 0.71 calcium 8.7 WBC 7.6 hemoglobin 11.5 hematocrit 34.9  ASSESSMENT/PLAN:  Closed right hip fracture S/P hip pinning - for rehabilitation  Hypertension - well-controlled  Depression - stable;  Constipation - start Colace 100 mg PO BID and Miralax 17 gm + 6-8 oz liquid PO Q D   CPT CODE: 40981

## 2013-05-21 NOTE — Progress Notes (Signed)
Patient ID: James Schultz, male   DOB: September 17, 1950, 62 y.o.   MRN: [id number]       PROGRESS NOTE  DATE: 05/13/2013   FACILITY: [facility]  LEVEL OF CARE: SNF (31)  Acute Visit  CHIEF COMPLAINT:  Discharge Note  HISTORY OF PRESENT ILLNESS: This is a 62 year old male who will go home AMA today. He had a fall and sustained a right hip fracture S/P hip pinning. He has been admitted for a short-term rehabilitation.   Reassessment of ongoing problem(s):  HTN: Pt 's HTN remains stable.  Denies CP, sob, DOE, pedal edema, headaches, dizziness or visual disturbances.  No complications from the medications currently being used.  Last BP : 138/85  CONSTIPATION: The constipation remains stable. No complications from the medications presently being used. Patient denies ongoing constipation, abdominal pain, nausea or vomiting.  DEPRESSION: The depression remains stable. Wellbutrin was recently discontinued due to interaction with Azilect. Staff do not report behavioral problems.  PAST MEDICAL HISTORY : Reviewed.  No changes.  CURRENT MEDICATIONS: Reviewed per [facility]  REVIEW OF SYSTEMS:  GENERAL: no change in appetite, no fatigue, no weight changes, no fever, chills or weakness RESPIRATORY: no cough, SOB, DOE, wheezing, hemoptysis CARDIAC: no chest pain, edema or palpitations GI: no abdominal pain, diarrhea, constipation, heart burn, nausea or vomiting  PHYSICAL EXAMINATION  VS:  T 97.3       P 72       RR 16      BP 138/85           WT 240 (Lb)  GENERAL: no acute distress, normal body habitus NECK: supple, trachea midline, no neck masses, no thyroid tenderness, no thyromegaly LYMPHATICS: no LAN in the neck, no supraclavicular LAN RESPIRATORY: breathing is even & unlabored, BS CTAB CARDIAC: RRR, no murmur,no extra heart sounds, no edema GI: abdomen soft, normal BS, no masses, no tenderness, no hepatomegaly, no splenomegaly PSYCHIATRIC: the patient is alert & oriented to  person, affect & behavior appropriate  LABS/RADIOLOGY: 04/15/13 sodium 138 potassium 3.8 glucose 95 BUN 10 creatinine 0.71 calcium 8.7 WBC 7.6 hemoglobin 11.5 hematocrit 34.9  ASSESSMENT/PLAN:  Closed right hip fracture S/P hip pinning - decided to go AMA today  Hypertension - well-controlled  Depression - stable  Constipation - no complaints  Parkinson's Disease - stable   I have written prescriptions.   Total discharge time:Less than 30 minutes  CPT CODE: 40981

## 2013-08-29 ENCOUNTER — Other Ambulatory Visit: Payer: Self-pay | Admitting: Specialist

## 2013-08-29 ENCOUNTER — Telehealth: Payer: Self-pay | Admitting: Neurology

## 2013-08-29 DIAGNOSIS — S72009A Fracture of unspecified part of neck of unspecified femur, initial encounter for closed fracture: Secondary | ICD-10-CM

## 2013-08-29 NOTE — Telephone Encounter (Signed)
Needs to be reassigned

## 2013-08-30 NOTE — Telephone Encounter (Signed)
I made reassignment with Dr. Janann Colonel with his approval.  (OV slot).

## 2013-09-02 ENCOUNTER — Encounter: Payer: Self-pay | Admitting: Neurology

## 2013-09-04 ENCOUNTER — Ambulatory Visit
Admission: RE | Admit: 2013-09-04 | Discharge: 2013-09-04 | Disposition: A | Discharge status: Home / Self Care | Payer: Medicare Other | Source: Ambulatory Visit | Attending: Specialist | Admitting: Specialist

## 2013-09-04 ENCOUNTER — Ambulatory Visit (INDEPENDENT_AMBULATORY_CARE_PROVIDER_SITE_OTHER): Payer: Medicare Other | Admitting: Neurology

## 2013-09-04 ENCOUNTER — Encounter: Payer: Self-pay | Admitting: Neurology

## 2013-09-04 VITALS — BP 114/68 | HR 81 | Ht 70.0 in | Wt 244.0 lb

## 2013-09-04 DIAGNOSIS — R2681 Unsteadiness on feet: Secondary | ICD-10-CM

## 2013-09-04 DIAGNOSIS — S72009A Fracture of unspecified part of neck of unspecified femur, initial encounter for closed fracture: Secondary | ICD-10-CM

## 2013-09-04 DIAGNOSIS — R269 Unspecified abnormalities of gait and mobility: Secondary | ICD-10-CM

## 2013-09-04 DIAGNOSIS — G20 Parkinson's disease: Secondary | ICD-10-CM

## 2013-09-04 NOTE — Patient Instructions (Signed)
Overall you are doing fairly well but I do want to suggest a few things today:   Remember to drink plenty of fluid, eat healthy meals and do not skip any meals. Try to eat protein with a every meal and eat a healthy snack such as fruit or nuts in between meals. Try to keep a regular sleep-wake schedule and try to exercise daily, particularly in the form of walking, 20-30 minutes a day, if you can.   As far as your medications are concerned, I would like to suggest the following: -Take an additional 1/2 tablet of Sinemet 25-100 as soon as you wake up. This should help decrease some of the freezing episodes you are having in the morning  Consider doing aquatic therapy, this is a great low impact way to keep active. Once you are cleared, we can consider Parkinson's specific physical therapy  AI would like to see you back in 4 months, sooner if we need to. Please call us with any interim questions, concerns, problems, updates or refill requests.   My clinical assistant and will answer any of your questions and relay your messages to me and also relay most of my messages to you.   Our phone number is [phone]. We also have an after hours call service for urgent matters and there is a physician on-call for urgent questions. For any emergencies you know to call 911 or go to the nearest emergency room

## 2013-09-04 NOTE — Progress Notes (Signed)
[facility]    Provider:  Dr Janann Colonel Referring Provider: Merlene Laughter, MD Primary Care Physician:  Glenda Chroman., MD  CC:  Parkinson's disease   HPI:  James Schultz is a 63 y.o. male here as a referral from Dr. Durwin Reges for PD management.  Previous patient of Dr. Bernardo Heater, last seen 10/2011, at that time considering DBS, instructed to follow up with Dr Nicki Reaper at [facility] for further evaluation. Since last visit he also suffered a mechanical fall and hip fracture, spent time in rehab. Was in 5 year Cr study at [facility], finished around 1 year ago. He did not notice any change while on his medication. He was evaluated by a doctor at [facility], told to wait until his symptoms worsen.   Continues to have pain in right hip, is being evaluated by ortho as to cause of this. Has had kidney stones since last visit.   Since coming out of rehab they feel that he is not moving as well and he is having increased tremors. He feels more stiff and that he is not moving as fast. They notice an increase in his tremors, especially when he is anxious or stressed.   Currently taking the Sinemet IR 4x a day, four hours a part. He feels he gets a good benefit from the Sinemet, notes some wearing off of his medication but unsure how long of duration he gets. No dyskinesias noted. He notices an increase in his freezing episodes, especially in the morning.   Currently taking Mirapex 1mg  three times a day and Azilect 1mg  daily. No noted compulsive behavior.    Per old records: Family hx of ET, PD, ALS. 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 Dx with OSA, uses a CPAP. Has some compulsive behavior.   Review of Systems: Out of a complete 14 system review, the patient complains of only the following symptoms, and all other reviewed systems are negative. + for tremor, gait instability, fatigue  History   Social History  . Marital Status: Married    Spouse Name: [place]    Number  of Children: 1  . Years of Education: Bachelor's   Occupational History  . RETIRED    Social History Main Topics  . Smoking status: Never Smoker   . Smokeless tobacco: Never Used  . Alcohol Use: No  . Drug Use: No  . Sexual Activity: Not on file   Other Topics Concern  . Not on file   Social History Narrative   Patient is married and lives at home with his wife ([place]), has 1 child   Patient is right handed   Education level is Bachelor's   Caffeine consumption is 3 cups daily    Family History  Problem Relation Age of Onset  . Cancer Mother   . Parkinson's disease      H/O  . Alzheimer's disease      H/O  . ALS      H/O    Past Medical History  Diagnosis Date  . Hypertension   . Anxiety   . Depression   . Parkinson's disease     lov note dr love 10-25-2011, lov note crae everywhere 02-19-2013 dr Kalman Shan scott  . Headache(784.0)   . Overactive bladder   . Overactive bladder   . Spinal stenosis of lumbar region   . Dysrhythmia   . Sleep apnea     cpap, setting of 12, uses [place] prongs no mask used  .  Obstructive sleep apnea syndrome   . Degenerative disc disease, cervical     Past Surgical History  Procedure Laterality Date  . Back surgery  2010    lumbar  . Right knee arthroscopy  2009  . Total knee arthroplasty Left 03/04/2013    Procedure: LEFT TOTAL KNEE ARTHROPLASTY;  Surgeon: Gearlean Alf, MD;  Location: [facility];  Service: Orthopedics;  Laterality: Left;  . Hip pinning,cannulated Right 04/13/2013    Procedure: CANNULATED HIP PINNING;  Surgeon: Johnn Hai, MD;  Location: [facility];  Service: Orthopedics;  Laterality: Right;  PERCUTANEOUS CANNULATED RIGHT HIP PINNING   . Lumbar laminectomy  10/2009    Current Outpatient Prescriptions  Medication Sig Dispense Refill  . aspirin EC 81 MG tablet Take 81 mg by mouth every morning.      . bisacodyl (DULCOLAX) 10 MG suppository Place 1 suppository (10 mg total) rectally daily as needed.  12 suppository  0   . carbidopa-levodopa (SINEMET IR) 25-100 MG per tablet Take 1 tablet by mouth 5 (five) times daily.      Marland Kitchen lisinopril (PRINIVIL,ZESTRIL) 10 MG tablet Take 10 mg by mouth every morning.      Marland Kitchen LORazepam (ATIVAN) 1 MG tablet Take 1 tablet (1 mg total) by mouth 4 (four) times daily as needed for anxiety.  30 tablet  0  . oxyCODONE (OXY IR/ROXICODONE) 5 MG immediate release tablet Take 1-2 tablets (5-10 mg total) by mouth every 4 (four) hours as needed for pain.  60 tablet  0  . oxyCODONE-acetaminophen (PERCOCET/ROXICET) 5-325 MG per tablet       . pramipexole (MIRAPEX) 1 MG tablet Take 0.5-1 mg by mouth 3 (three) times daily. Takes 1 tablet in the morning 6am and half tablet at 10 am and half tablet at 2 pm      . pramipexole (MIRAPEX) 1 MG tablet Take by mouth.      . rasagiline (AZILECT) 1 MG TABS Take 1 mg by mouth every morning.        No current facility-administered medications for this visit.    Allergies as of 09/04/2013  . (No Known Allergies)    Vitals: BP 114/68  Pulse 81  Ht 5\' 10"  (1.778 m)  Wt 244 lb (110.678 kg)  BMI 35.01 kg/m2 Last Weight:  Wt Readings from Last 1 Encounters:  09/04/13 244 lb (110.678 kg)   Last Height:   Ht Readings from Last 1 Encounters:  09/04/13 5\' 10"  (1.778 m)     Physical exam: Exam: Gen: NAD, conversant Eyes: anicteric sclerae, moist conjunctivae HENT: Atraumatic, oropharynx clear Neck: Trachea midline; supple,  Lungs: CTA, no wheezing, rales, rhonic                          CV: RRR, no MRG Abdomen: Soft, non-tender;  Extremities: No peripheral edema  Skin: Normal temperature, no rash,  Psych: Appropriate affect, pleasant  Neuro: MS: AA&Ox3, appropriately interactive, normal affect   Attention: WORLD backwards  Speech: fluent w/o paraphasic error  Memory: good recent and remote recall  CN: PERRL, EOMI no nystagmus, no ptosis, sensation intact to LT V1-V3 bilat, face symmetric, no weakness, hearing grossly intact, palate  elevates symmetrically, shoulder shrug 5/5 bilat,  tongue protrudes midline, no fasiculations noted.  Motor: normal bulk and tone Strength: 5/5  In all extremities  Coord: rapid alternating and point-to-point (FNF, HTS) movements intact.  Reflexes: symmetrical, bilat downgoing toes  Sens: LT intact in  all extremities  Gait: posture, stance, stride and arm-swing normal. Tandem gait intact. Able to walk on heels and toes. Romberg absent.   Assessment:  After physical and neurologic examination, review of laboratory studies, imaging, neurophysiology testing and pre-existing records, assessment will be reviewed on the problem list.  Plan:  Treatment plan and additional workup will be reviewed under Problem List.    Jim Like, DO  [facility] [address] [place], [place] [address]  Phone [phone] Fax [phone]

## 2013-09-26 ENCOUNTER — Telehealth: Payer: Self-pay | Admitting: Neurology

## 2013-09-26 MED ORDER — PRAMIPEXOLE DIHYDROCHLORIDE 1 MG PO TABS: 1.0000 mg | ORAL_TABLET | Freq: Three times a day (TID) | ORAL | Status: DC

## 2013-09-26 MED ORDER — RASAGILINE MESYLATE 1 MG PO TABS: 1.0000 mg | ORAL_TABLET | Freq: Every morning | ORAL | Status: DC

## 2013-09-26 MED ORDER — CARBIDOPA-LEVODOPA 25-100 MG PO TABS: 1.0000 | ORAL_TABLET | Freq: Four times a day (QID) | ORAL | Status: DC

## 2013-09-26 NOTE — Telephone Encounter (Signed)
Rx's have been sent. 

## 2013-09-26 NOTE — Telephone Encounter (Signed)
Pt needs all RX sent to [place] for Pramipexole 1mg , Azilect 1mg , and Carbidopa-levodopa 25/100 tab. And they need them in a 90day supply per the insurance

## 2013-10-28 ENCOUNTER — Other Ambulatory Visit: Payer: Self-pay | Admitting: Urology

## 2013-11-01 ENCOUNTER — Encounter (HOSPITAL_BASED_OUTPATIENT_CLINIC_OR_DEPARTMENT_OTHER): Payer: Self-pay | Admitting: *Deleted

## 2013-11-06 ENCOUNTER — Encounter (HOSPITAL_BASED_OUTPATIENT_CLINIC_OR_DEPARTMENT_OTHER): Payer: Self-pay | Admitting: *Deleted

## 2013-11-06 NOTE — Progress Notes (Addendum)
NPO AFTER MN. ARRIVE AT 0830. NEEDS ISTAT AND KUB. CURRENT EKG IN EPIC AND CHART. WILL TAKE SINEMET, AZILECT, AND MIRAPEX AM DOS W/ SIPS OF WATER AND IF NEEDED MAY TAKE OXYCODONE .

## 2013-11-13 ENCOUNTER — Ambulatory Visit (HOSPITAL_COMMUNITY): Payer: Medicare Other

## 2013-11-13 ENCOUNTER — Ambulatory Visit (HOSPITAL_BASED_OUTPATIENT_CLINIC_OR_DEPARTMENT_OTHER)
Admission: RE | Admit: 2013-11-13 | Discharge: 2013-11-13 | Disposition: A | Discharge status: Home / Self Care | Payer: Medicare Other | Source: Ambulatory Visit | Attending: Urology | Admitting: Urology

## 2013-11-13 ENCOUNTER — Encounter (HOSPITAL_BASED_OUTPATIENT_CLINIC_OR_DEPARTMENT_OTHER): Payer: Medicare Other | Admitting: Anesthesiology

## 2013-11-13 ENCOUNTER — Ambulatory Visit (HOSPITAL_BASED_OUTPATIENT_CLINIC_OR_DEPARTMENT_OTHER): Payer: Medicare Other | Admitting: Anesthesiology

## 2013-11-13 ENCOUNTER — Encounter (HOSPITAL_BASED_OUTPATIENT_CLINIC_OR_DEPARTMENT_OTHER): Payer: Self-pay

## 2013-11-13 ENCOUNTER — Encounter (HOSPITAL_BASED_OUTPATIENT_CLINIC_OR_DEPARTMENT_OTHER)
Admission: RE | Disposition: A | Discharge status: Home / Self Care | Payer: Self-pay | Source: Ambulatory Visit | Attending: Urology

## 2013-11-13 DIAGNOSIS — F3289 Other specified depressive episodes: Secondary | ICD-10-CM | POA: Insufficient documentation

## 2013-11-13 DIAGNOSIS — I10 Essential (primary) hypertension: Secondary | ICD-10-CM | POA: Insufficient documentation

## 2013-11-13 DIAGNOSIS — G20A1 Parkinson's disease without dyskinesia, without mention of fluctuations: Secondary | ICD-10-CM | POA: Insufficient documentation

## 2013-11-13 DIAGNOSIS — F329 Major depressive disorder, single episode, unspecified: Secondary | ICD-10-CM | POA: Insufficient documentation

## 2013-11-13 DIAGNOSIS — E7800 Pure hypercholesterolemia, unspecified: Secondary | ICD-10-CM | POA: Insufficient documentation

## 2013-11-13 DIAGNOSIS — G4730 Sleep apnea, unspecified: Secondary | ICD-10-CM | POA: Insufficient documentation

## 2013-11-13 DIAGNOSIS — Z79899 Other long term (current) drug therapy: Secondary | ICD-10-CM | POA: Insufficient documentation

## 2013-11-13 DIAGNOSIS — N200 Calculus of kidney: Secondary | ICD-10-CM

## 2013-11-13 DIAGNOSIS — F411 Generalized anxiety disorder: Secondary | ICD-10-CM | POA: Insufficient documentation

## 2013-11-13 DIAGNOSIS — E669 Obesity, unspecified: Secondary | ICD-10-CM | POA: Insufficient documentation

## 2013-11-13 DIAGNOSIS — N201 Calculus of ureter: Secondary | ICD-10-CM | POA: Insufficient documentation

## 2013-11-13 DIAGNOSIS — G20 Parkinson's disease: Secondary | ICD-10-CM | POA: Insufficient documentation

## 2013-11-13 HISTORY — DX: Unspecified urinary incontinence: R32

## 2013-11-13 HISTORY — DX: Calculus of ureter: N20.1

## 2013-11-13 HISTORY — PX: HOLMIUM LASER APPLICATION: SHX5852

## 2013-11-13 HISTORY — DX: Presence of spectacles and contact lenses: Z97.3

## 2013-11-13 HISTORY — PX: CYSTOSCOPY WITH RETROGRADE PYELOGRAM, URETEROSCOPY AND STENT PLACEMENT: SHX5789

## 2013-11-13 HISTORY — DX: Obstructive sleep apnea (adult) (pediatric): G47.33

## 2013-11-13 HISTORY — DX: Spondylosis without myelopathy or radiculopathy, cervical region: M47.812

## 2013-11-13 HISTORY — DX: Unspecified osteoarthritis, unspecified site: M19.90

## 2013-11-13 LAB — POCT I-STAT 4, (NA,K, GLUC, HGB,HCT)
Glucose, Bld: 99 mg/dL (ref 70–99)
HEMATOCRIT: 42 % (ref 39.0–52.0)
Hemoglobin: 14.3 g/dL (ref 13.0–17.0)
Potassium: 4 mEq/L (ref 3.7–5.3)
Sodium: 146 mEq/L (ref 137–147)

## 2013-11-13 SURGERY — CYSTOURETEROSCOPY, WITH RETROGRADE PYELOGRAM AND STENT INSERTION
Anesthesia: General | Site: Ureter | Laterality: Right

## 2013-11-13 MED ORDER — SODIUM CHLORIDE 0.9 % IR SOLN
Status: DC | PRN
  Administered 2013-11-13: 6000 mL via INTRAVESICAL

## 2013-11-13 MED ORDER — PROPOFOL 10 MG/ML IV BOLUS
INTRAVENOUS | Status: DC | PRN
  Administered 2013-11-13: 150 mg via INTRAVENOUS

## 2013-11-13 MED ORDER — EPHEDRINE SULFATE 50 MG/ML IJ SOLN
INTRAMUSCULAR | Status: DC | PRN
  Administered 2013-11-13: 10 mg via INTRAVENOUS

## 2013-11-13 MED ORDER — MIDAZOLAM HCL 5 MG/5ML IJ SOLN
INTRAMUSCULAR | Status: DC | PRN
  Administered 2013-11-13: 0.5 mg via INTRAVENOUS

## 2013-11-13 MED ORDER — LACTATED RINGERS IV SOLN
INTRAVENOUS | Status: DC
  Filled 2013-11-13: qty 1000

## 2013-11-13 MED ORDER — FENTANYL CITRATE 0.05 MG/ML IJ SOLN
INTRAMUSCULAR | Status: AC
  Filled 2013-11-13: qty 4

## 2013-11-13 MED ORDER — PROMETHAZINE HCL 25 MG/ML IJ SOLN
6.2500 mg | INTRAMUSCULAR | Status: DC | PRN
Start: 2013-11-13 — End: 2013-11-13
  Filled 2013-11-13: qty 1

## 2013-11-13 MED ORDER — CIPROFLOXACIN HCL 500 MG PO TABS: 500.0000 mg | ORAL_TABLET | Freq: Once | ORAL | Status: DC

## 2013-11-13 MED ORDER — TROSPIUM CHLORIDE ER 60 MG PO CP24: 60.0000 mg | ORAL_CAPSULE | Freq: Every day | ORAL | Status: DC

## 2013-11-13 MED ORDER — PHENAZOPYRIDINE HCL 100 MG PO TABS
ORAL_TABLET | ORAL | Status: AC
  Filled 2013-11-13: qty 2

## 2013-11-13 MED ORDER — DOCUSATE SODIUM 100 MG PO CAPS: 100.0000 mg | ORAL_CAPSULE | Freq: Two times a day (BID) | ORAL | Status: DC | PRN

## 2013-11-13 MED ORDER — PHENAZOPYRIDINE HCL 200 MG PO TABS: 200.0000 mg | ORAL_TABLET | Freq: Three times a day (TID) | ORAL | Status: DC | PRN

## 2013-11-13 MED ORDER — ACETAMINOPHEN-CODEINE #3 300-30 MG PO TABS: 1.0000 | ORAL_TABLET | ORAL | Status: DC | PRN

## 2013-11-13 MED ORDER — FENTANYL CITRATE 0.05 MG/ML IJ SOLN
25.0000 ug | INTRAMUSCULAR | Status: DC | PRN
  Administered 2013-11-13 (×2): 25 ug via INTRAVENOUS
  Filled 2013-11-13: qty 1

## 2013-11-13 MED ORDER — FENTANYL CITRATE 0.05 MG/ML IJ SOLN
INTRAMUSCULAR | Status: DC | PRN
  Administered 2013-11-13: 50 ug via INTRAVENOUS
  Administered 2013-11-13 (×3): 25 ug via INTRAVENOUS

## 2013-11-13 MED ORDER — DEXAMETHASONE SODIUM PHOSPHATE 4 MG/ML IJ SOLN
INTRAMUSCULAR | Status: DC | PRN
  Administered 2013-11-13: 10 mg via INTRAVENOUS

## 2013-11-13 MED ORDER — PHENAZOPYRIDINE HCL 200 MG PO TABS
200.0000 mg | ORAL_TABLET | Freq: Once | ORAL | Status: AC | PRN
  Administered 2013-11-13: 200 mg via ORAL
  Filled 2013-11-13: qty 1

## 2013-11-13 MED ORDER — FENTANYL CITRATE 0.05 MG/ML IJ SOLN
INTRAMUSCULAR | Status: AC
  Filled 2013-11-13: qty 2

## 2013-11-13 MED ORDER — IOHEXOL 350 MG/ML SOLN
INTRAVENOUS | Status: DC | PRN
  Administered 2013-11-13: 4 mL via INTRAVENOUS

## 2013-11-13 MED ORDER — MIDAZOLAM HCL 2 MG/2ML IJ SOLN
INTRAMUSCULAR | Status: AC
  Filled 2013-11-13: qty 2

## 2013-11-13 MED ORDER — LIDOCAINE HCL (CARDIAC) 20 MG/ML IV SOLN
INTRAVENOUS | Status: DC | PRN
  Administered 2013-11-13: 70 mg via INTRAVENOUS

## 2013-11-13 MED ORDER — CIPROFLOXACIN IN D5W 400 MG/200ML IV SOLN
400.0000 mg | INTRAVENOUS | Status: AC
  Administered 2013-11-13: 400 mg via INTRAVENOUS
  Filled 2013-11-13: qty 200

## 2013-11-13 MED ORDER — ONDANSETRON HCL 4 MG/2ML IJ SOLN
INTRAMUSCULAR | Status: DC | PRN
  Administered 2013-11-13: 4 mg via INTRAVENOUS

## 2013-11-13 MED ORDER — LACTATED RINGERS IV SOLN
INTRAVENOUS | Status: DC
  Administered 2013-11-13 (×2): via INTRAVENOUS
  Filled 2013-11-13: qty 1000

## 2013-11-13 SURGICAL SUPPLY — 38 items
APL SKNCLS STERI-STRIP NONHPOA (GAUZE/BANDAGES/DRESSINGS) ×1
BAG DRAIN URO-CYSTO SKYTR STRL (DRAIN) ×3 IMPLANT
BAG DRN UROCATH (DRAIN) ×1
BASKET LASER NITINOL 1.9FR (BASKET) IMPLANT
BASKET STNLS GEMINI 4WIRE 3FR (BASKET) IMPLANT
BASKET ZERO TIP NITINOL 2.4FR (BASKET) IMPLANT
BENZOIN TINCTURE PRP APPL 2/3 (GAUZE/BANDAGES/DRESSINGS) ×2 IMPLANT
BSKT STON RTRVL 120 1.9FR (BASKET)
BSKT STON RTRVL GEM 120X11 3FR (BASKET)
BSKT STON RTRVL ZERO TP 2.4FR (BASKET)
CANISTER SUCT LVC 12 LTR MEDI- (MISCELLANEOUS) ×3 IMPLANT
CATH URET 5FR 28IN OPEN ENDED (CATHETERS) ×3 IMPLANT
CATH URET DUAL LUMEN 6-10FR 50 (CATHETERS) IMPLANT
CLOTH BEACON ORANGE TIMEOUT ST (SAFETY) ×3 IMPLANT
DRAPE CAMERA CLOSED 9X96 (DRAPES) ×3 IMPLANT
DRSG TEGADERM 2-3/8X2-3/4 SM (GAUZE/BANDAGES/DRESSINGS) ×2 IMPLANT
EXTRACTOR STONE NITINOL NGAGE (UROLOGICAL SUPPLIES) ×2 IMPLANT
FIBER LASER FLEXIVA 200 (UROLOGICAL SUPPLIES) IMPLANT
FIBER LASER FLEXIVA 365 (UROLOGICAL SUPPLIES) ×2 IMPLANT
GLOVE BIO SURGEON STRL SZ7.5 (GLOVE) ×3 IMPLANT
GOWN STRL REIN XL XLG (GOWN DISPOSABLE) ×1 IMPLANT
GUIDEWIRE 0.038 PTFE COATED (WIRE) IMPLANT
GUIDEWIRE ANG ZIPWIRE 038X150 (WIRE) IMPLANT
GUIDEWIRE STR DUAL SENSOR (WIRE) ×3 IMPLANT
IV NS 1000ML (IV SOLUTION) ×3
IV NS 1000ML BAXH (IV SOLUTION) IMPLANT
IV NS IRRIG 3000ML ARTHROMATIC (IV SOLUTION) ×4 IMPLANT
KIT BALLIN UROMAX 15FX10 (LABEL) IMPLANT
KIT BALLN UROMAX 15FX4 (MISCELLANEOUS) IMPLANT
KIT BALLN UROMAX 26 75X4 (MISCELLANEOUS)
NS IRRIG 500ML POUR BTL (IV SOLUTION) IMPLANT
PACK CYSTOSCOPY (CUSTOM PROCEDURE TRAY) ×3 IMPLANT
SET HIGH PRES BAL DIL (LABEL)
SHEATH ACCESS URETERAL 38CM (SHEATH) IMPLANT
SHEATH ACCESS URETERAL 54CM (SHEATH) IMPLANT
STENT 6X26 (STENTS) IMPLANT
STENT URET 6FRX26 CONTOUR (STENTS) ×2 IMPLANT
TUBE FEEDING 8FR 16IN STR KANG (MISCELLANEOUS) IMPLANT

## 2013-11-13 NOTE — Anesthesia Procedure Notes (Signed)
Procedure Name: LMA Insertion Date/Time: 11/13/2013 10:12 AM Performed by: Mechele Claude Pre-anesthesia Checklist: Patient identified, Emergency Drugs available, Suction available and Patient being monitored Patient Re-evaluated:Patient Re-evaluated prior to inductionOxygen Delivery Method: Circle System Utilized Preoxygenation: Pre-oxygenation with 100% oxygen Intubation Type: IV induction Ventilation: Mask ventilation without difficulty LMA: LMA inserted LMA Size: 5.0 Number of attempts: 1 Airway Equipment and Method: bite block Placement Confirmation: positive ETCO2 Tube secured with: Tape Dental Injury: Teeth and Oropharynx as per pre-operative assessment

## 2013-11-13 NOTE — Anesthesia Preprocedure Evaluation (Addendum)
Anesthesia Evaluation  Patient identified by MRN, date of birth, ID band Patient awake    Reviewed: Allergy & Precautions, H&P , NPO status , Patient's Chart, lab work & pertinent test results  Airway Mallampati: II TM Distance: >3 FB Neck ROM: Full    Dental  (+) Teeth Intact, Dental Advisory Given   Pulmonary neg pulmonary ROS, sleep apnea and Continuous Positive Airway Pressure Ventilation ,  breath sounds clear to auscultation  Pulmonary exam normal       Cardiovascular hypertension, negative cardio ROS  Rhythm:Regular Rate:Normal     Neuro/Psych  Headaches, Anxiety Depression Parkinsons negative neurological ROS  negative psych ROS   GI/Hepatic negative GI ROS, Neg liver ROS,   Endo/Other  negative endocrine ROSMorbid obesity  Renal/GU negative Renal ROS  negative genitourinary   Musculoskeletal negative musculoskeletal ROS (+)   Abdominal   Peds  Hematology negative hematology ROS (+)   Anesthesia Other Findings   Reproductive/Obstetrics                          Anesthesia Physical Anesthesia Plan  ASA: III  Anesthesia Plan: General   Post-op Pain Management:    Induction: Intravenous  Airway Management Planned: LMA  Additional Equipment:   Intra-op Plan:   Post-operative Plan: Extubation in OR  Informed Consent: I have reviewed the patients History and Physical, chart, labs and discussed the procedure including the risks, benefits and alternatives for the proposed anesthesia with the patient or authorized representative who has indicated his/her understanding and acceptance.   Dental advisory given  Plan Discussed with: CRNA  Anesthesia Plan Comments:         Anesthesia Quick Evaluation

## 2013-11-13 NOTE — Discharge Instructions (Signed)
DISCHARGE INSTRUCTIONS FOR KIDNEY STONES OR URETERAL STENT   MEDICATIONS:  1. DO NOT RESUME YOUR ASPIRIN, or any other medicines like ibuprofen, motrin, excedrin, advil, aleve, vitamin E, fish oil as these can all cause bleeding x 7 days.  2. Resume all your other meds from home - except do not take any other pain meds that you may have at home.  3. Take Cipro one hour prior to removal of your stent.  4. Trospium is to prevent bladder spasms and help reduce urinary frequency. 5. Pyridium is to help with the burning/stinging when you urinate. 6. Tylenol with codeine is for moderate/severe pain, otherwise taking upto 1000mg  every 6 hours of plainTylenol will help treat your pain.  Do not take both at the same time.  ACTIVITY:  1. No strenuous activity x 1week  2. No driving while on narcotic pain medications  3. Drink plenty of water  4. Continue to walk at home - you can still get blood clots when you are at home, so keep active, but don't over do it.  5. May return to work/school tomorrow or when you feel ready   BATHING:  1. You can shower and we recommend daily showers  2. You have a string coming from your urethra: The stent string is attached to your ureteral stent. Do not pull on this.   SIGNS/SYMPTOMS TO CALL:  Please call us if you have a fever greater than 101.5, uncontrolled nausea/vomiting, uncontrolled pain, dizziness, unable to urinate, bloody urine, chest pain, shortness of breath, leg swelling, leg pain, redness around wound, drainage from wound, or any other concerns or questions.   You can reach [place] at [phone].   FOLLOW-UP:  1. You have an appointment in 2 weeks to see Dr. Matilde Sprang. 2. You have a string attached to your stent, you may remove it on April 6th in the AM. To do this, pull the strings until the stents are completely removed. You may feel an odd sensation in your back.    Post Anesthesia Home Care Instructions  Activity: Get plenty of rest for the  remainder of the day. A responsible adult should stay with you for 24 hours following the procedure.  For the next 24 hours, DO NOT: -Drive a car -Paediatric nurse -Drink alcoholic beverages -Take any medication unless instructed by your physician -Make any legal decisions or sign important papers.  Meals: Start with liquid foods such as gelatin or soup. Progress to regular foods as tolerated. Avoid greasy, spicy, heavy foods. If nausea and/or vomiting occur, drink only clear liquids until the nausea and/or vomiting subsides. Call your physician if vomiting continues.  Special Instructions/Symptoms: Your throat may feel dry or sore from the anesthesia or the breathing tube placed in your throat during surgery. If this causes discomfort, gargle with warm salt water. The discomfort should disappear within 24 hours.

## 2013-11-13 NOTE — Op Note (Signed)
Preoperative diagnosis: right ureteral calculus  Postoperative diagnosis:  right ureteral calculus  Procedure:  1. Cystoscopy 2. right ureteroscopy and stone removal 3. Ureteroscopic laser lithotripsy 4. right 41F x 26 ureteral stent placement  5. right retrograde pyelography with interpretation  Surgeon: Ardis Hughs, MD  Anesthesia: General  Complications: None  Intraoperative findings: right retrograde pyelography demonstrated a filling defect within the right ureter consistent with the patient's known calculus without other abnormalities.  EBL: Minimal  Specimens: 1. right ureteral calculus  Disposition of specimens: [facility] for stone analysis  Indication: James Schultz is a 63 y.o.   patient with urolithiasis. After reviewing the management options for treatment, the patient elected to proceed with the above surgical procedure(s). We have discussed the potential benefits and risks of the procedure, side effects of the proposed treatment, the likelihood of the patient achieving the goals of the procedure, and any potential problems that might occur during the procedure or recuperation. Informed consent has been obtained.  Description of procedure:  The patient was taken to the operating room and general anesthesia was induced.  The patient was placed in the dorsal lithotomy position, prepped and draped in the usual sterile fashion, and preoperative antibiotics were administered. A preoperative time-out was performed.   Cystourethroscopy was performed.  The patient's urethra was examined and was normal and demonstrated bilobar prostatic hypertrophy. The bladder was then systematically examined in its entirety. There was no evidence for any bladder tumors, stones, or other mucosal pathology.    Attention then turned to the right ureteral orifice and a ureteral catheter was used to intubate the ureteral orifice.  Omnipaque contrast was injected  through the ureteral catheter and a retrograde pyelogram was performed with findings as dictated above.  A 0.38 sensor guidewire was then advanced up the right ureter into the renal pelvis under fluoroscopic guidance. The 6 Fr semirigid ureteroscope was then advanced into the ureter next to the guidewire and the calculus was identified.   The stone was then fragmented with the 365 micron holmium laser fiber on a setting of 0.6 and frequency of 6 Hz.   I then increased the power to 1.0 Joules.  All stones were then removed from the ureter with an N-gage nitinol basket.  Reinspection of the ureter revealed no remaining visible stones or fragments.     I then took the flexible ureteroscope and navigated up into the ureter and into the right renal pelvis.  There was a small ditzel stone that I opted to leave because it was to small to basket.  I then removed the ureteroscope under visual guidance.  The wire was then backloaded through the cystoscope and a ureteral stent was advance over the wire using Seldinger technique.  The stent was positioned appropriately under fluoroscopic and cystoscopic guidance.  The wire was then removed with an adequate stent curl noted in the renal pelvis as well as in the bladder.  The bladder was then emptied and the procedure ended.  The patient appeared to tolerate the procedure well and without complications.  The patient was able to be awakened and transferred to the recovery unit in satisfactory condition.   Disposition: The tether of the stent was left on and secured to the ventral aspect of the patient's penis.   Instructions for removing the stent have been provided to the patient. This has been scheduled for followup in 6 weeks with a renal ultrasound.

## 2013-11-13 NOTE — Transfer of Care (Signed)
Immediate Anesthesia Transfer of Care Note  Patient: KENETH BORG  Procedure(s) Performed: Procedure(s): CYSTOSCOPY WITH RIGHT URETEROSCOPY, Right Retrograde Pyelogram, RIGHT URETERAL STENT PLACEMENT, Basket Stone Extraction (Right) RIGHT LASER LITHOTRIPSY (Right)  Patient Location: PACU  Anesthesia Type:General  Level of Consciousness: sedated and responds to stimulation  Airway & Oxygen Therapy: Patient Spontanous Breathing and Patient connected to nasal cannula oxygen  Post-op Assessment: Report given to PACU RN  Post vital signs: Reviewed and stable  Complications: No apparent anesthesia complications

## 2013-11-13 NOTE — H&P (Signed)
Reason For Visit Right ureteral stone   History of Present Illness James Schultz is a 38M who has been seeing Dr. Matilde Sprang for voiding symptoms and right ureteral stone. He was placed on Rapflo for medical explusive therapy of his ureteral stone and represented 3 months later with what appears to be a persistant right ureteral stone that has only moved slightly in 3 months. The patient is relatively asymptomatic aside from his voiding symptoms. He has not had any pain or fevers.   Past Medical History Problems  1. History of Anxiety (300.00) 2. History of Arthritis (V13.4) 3. History of depression (V11.8) 4. History of hypercholesterolemia (V12.29) 5. History of hypertension (V12.59) 6. History of sleep apnea (V13.89) 7. History of Parkinson's disease (332.0)  Surgical History Problems  1. History of Gallbladder Surgery 2. History of Knee Surgery  Current Meds 1. Azilect 1 MG Oral Tablet;  Therapy: (Recorded:09Feb2010) to Recorded 2. Budeprion SR 150 MG Oral Tablet Extended Release 12 Hour;  Therapy: (Recorded:09Feb2010) to Recorded 3. Carbidopa-Levodopa 25-100 MG Oral Tablet;  Therapy: [phone] to Recorded 4. Ciprofloxacin HCl - 500 MG Oral Tablet;  Therapy: [id number] to Recorded 5. Enablex 7.5 MG Oral Tablet Extended Release 24 Hour; 7.5 mg daily;  Therapy: [id number] to (Last Rx:14Feb2012) Ordered 6. Hydrocodone-Acetaminophen 5-325 MG Oral Tablet; Take 1 tablet every 4 - 6 hours as  needed;  Therapy: 14GYJ8563 to (Evaluate:13Mar2015); Last Rx:03Mar2015 Ordered 7. Ibuprofen 200 MG Oral Tablet;  Therapy: (Recorded:09Feb2010) to Recorded 8. Lisinopril 10 MG Oral Tablet;  Therapy: 21Aug2013 to Recorded 9. LORazepam 1 MG Oral Tablet;  Therapy: 27Mar2013 to Recorded 10. Ondansetron HCl - 4 MG Oral Tablet;   Therapy: 14HFW2637 to Recorded 11. Oxybutynin Chloride 5 MG Oral Tablet; 1 tab bid po;   Therapy: 85YIF0277 to (Evaluate:10Oct2012); Last Rx:14Mar2012 Ordered 12.  Oxycodone-Acetaminophen 10-325 MG Oral Tablet; TAKE 1 TABLET EVERY 4 TO 6   HOURS AS NEEDED FOR PAIN;   Therapy: [id number] to (Evaluate:24Dec2014); Last Rx:19Dec2014 Ordered 13. Oxycodone-Acetaminophen 10-325 MG Oral Tablet;   Therapy: 72CNO7096 to Recorded 14. Oxycodone-Acetaminophen 5-325 MG Oral Tablet;   Therapy: 28ZMO2947 to Recorded 15. Pramipexole Dihydrochloride 1 MG Oral Tablet;   Therapy: (Recorded:09Feb2010) to Recorded 16. Promethazine HCl - 25 MG Oral Tablet; TAKE 1 TABLET EVERY 6 HOURS AS NEEDED   FOR NAUSEA;   Therapy: 65YYT0354 to (Evaluate:08Mar2015); Last Rx:03Mar2015 Ordered 17. Rapaflo 8 MG Oral Capsule; TAKE ONE CAPSULE BY MOUTH DAILY WITH FOOD;   Therapy: 65KCL2751 to (Evaluate:26Feb2016); Last Rx:03Mar2015 Ordered 18. Tamsulosin HCl - 0.4 MG Oral Capsule;   Therapy: 70YFV4944 to Recorded 19. Viagra 100 MG Oral Tablet; TAKE ONE TABLET AS DIRECTED;   Therapy: 96PRF1638 to (Last Rx:18Nov2014)  Requested for: 46KZL9357 Ordered  Allergies Medication  1. No Known Drug Allergies  Family History Problems  1. Family history of Renal Failure : Mother  Social History Problems  1. Denied: History of Alcohol Use 2. Caffeine Use   2-3 DRINKS DAILY 3. Marital History - Currently Married 4. Never A Smoker 5. Occupation:   RETIRED 6. Denied: History of Tobacco Use  Vitals Vital Signs [Data Includes: Last 1 Day]  Recorded: [id number] 02:44PM  Height: 5 ft 10 in Weight: 241 lb  BMI Calculated: 34.58 BSA Calculated: 2.26 Blood Pressure: 98 / 60 Temperature: 98.4 F Heart Rate: 91  Results/Data Urine [Data Includes: Last 1 Day]   [id number]  COLOR YELLOW   APPEARANCE CLEAR   SPECIFIC GRAVITY 1.030   pH 5.0   GLUCOSE NEG mg/dL  BILIRUBIN NEG   KETONE NEG mg/dL  BLOOD NEG   PROTEIN NEG mg/dL  UROBILINOGEN 0.2 mg/dL  NITRITE NEG   LEUKOCYTE ESTERASE NEG    KUB: performed in our clinic today, the renal shadows are present bilaterally. The patient also has  additional phleboliths in his pelvis. There are no stones visualized within the expected trajectory of the left and right collecting systems. There is a stone in the distal right ureter that may represent the patient's stone. This was seen on the prior KUB last week and was not present on the patient's CT scan scout film.   Assessment Right ureteral stone  LUTS   Plan Health Maintenance  1. UA With REFLEX; [Do Not Release]; Status:Complete;   Done: 92HVF4734 02:38PM Nephrolithiasis  2. Follow-up Schedule Surgery Office  Follow-up  Status: Complete  Done: [id number] 3. URINE CULTURE; Status:In Progress - Specimen/Data Collected;   Done: [id number]  Discussion/Summary unfortunately I don't think that the patient is going to pass his stone at this point. It has been three months and it is now likely impacted with associated periureteral edema. We discussed options for management including ESWL and URS. In this particular situation I favor URS because his stone is relatively distal in his ureter and since its likely impacted he may not pass his fragments easily. We will continue with MET for two weeks and schedule the operation. I have provided him more Rapaflo samples.  If he passes the stone in the interim then we can cancel the case. I went over right ureteroscopy, laser lithotripsy, right stent placement. We discussed the risk/benefits of procedure in detail.

## 2013-11-13 NOTE — Interval H&P Note (Signed)
History and Physical Interval Note: RRR CTA-B KUB: stone appears to be in same position as it was in clinic 2 weeks ago.   Plan: okay to proceed with right URS/stone extraction.  11/13/2013 9:58 AM  James Schultz  has presented today for surgery, with the diagnosis of RIGHT DISTAL URETERAL STONE  The various methods of treatment have been discussed with the patient and family. After consideration of risks, benefits and other options for treatment, the patient has consented to  Procedure(s): CYSTOSCOPY WITH RIGHT URETEROSCOPY, RIGHT URETERAL STENT PLACEMENT (Right) RIGHT LASER LITHOTRIPSY (Right) as a surgical intervention .  The patient's history has been reviewed, patient examined, no change in status, stable for surgery.  I have reviewed the patient's chart and labs.  Questions were answered to the patient's satisfaction.     James Schultz

## 2013-11-14 NOTE — Anesthesia Postprocedure Evaluation (Signed)
Anesthesia Post Note  Patient: James Schultz  Procedure(s) Performed: Procedure(s) (LRB): CYSTOSCOPY WITH RIGHT URETEROSCOPY, Right Retrograde Pyelogram, RIGHT URETERAL STENT PLACEMENT, Basket Stone Extraction (Right) RIGHT LASER LITHOTRIPSY (Right)  Anesthesia type: General  Patient location: PACU  Post pain: Pain level controlled  Post assessment: Post-op Vital signs reviewed  Last Vitals:  Filed Vitals:   11/13/13 1330  BP: 148/90  Pulse: 81  Temp: 36.4 C  Resp: 10    Post vital signs: Reviewed  Level of consciousness: sedated  Complications: No apparent anesthesia complications

## 2013-11-18 ENCOUNTER — Encounter (HOSPITAL_BASED_OUTPATIENT_CLINIC_OR_DEPARTMENT_OTHER): Payer: Self-pay | Admitting: Urology

## 2014-01-03 ENCOUNTER — Ambulatory Visit (INDEPENDENT_AMBULATORY_CARE_PROVIDER_SITE_OTHER): Payer: Medicare Other | Admitting: Neurology

## 2014-01-03 ENCOUNTER — Encounter: Payer: Self-pay | Admitting: Neurology

## 2014-01-03 ENCOUNTER — Encounter (INDEPENDENT_AMBULATORY_CARE_PROVIDER_SITE_OTHER): Payer: Self-pay

## 2014-01-03 VITALS — BP 148/86 | HR 75 | Ht 70.0 in | Wt 241.0 lb

## 2014-01-03 DIAGNOSIS — G4733 Obstructive sleep apnea (adult) (pediatric): Secondary | ICD-10-CM

## 2014-01-03 DIAGNOSIS — G20 Parkinson's disease: Secondary | ICD-10-CM

## 2014-01-03 DIAGNOSIS — Z9989 Dependence on other enabling machines and devices: Secondary | ICD-10-CM

## 2014-01-03 NOTE — Progress Notes (Signed)
[facility]    Provider:  Dr Janann Colonel Referring Provider: Glenda Chroman., MD Primary Care Physician:  Glenda Chroman., MD  CC:  Parkinson's disease   HPI:  James Schultz is a 63 y.o. male here as a follow from Dr. Woody Seller for PD management. Last visit was 4 months ago, at which time he was doing well overall. It was suggested he try taking an extra 1/2 tab of Sinemet 25-100 in the morning for wearing off/fatigue. Patient and his wife think that his mobility and cognition are slightly worse compared to prior visit. Notes he is having increased shuffling of his feet, having more freezing episodes. Starts off ok, around 1pm he will typically have severe fatigue and take a short nap, wakes up feeling refreshed and then wiped out again by 9pm. Notes overall poor sleep, has history of OSA and is not wearing his CPAP. States it has been years since his device was titrated. He is not sure who his sleep doctor is.   Currently taking the Sinemet 4 times a day at 7am, 11am, 3pm and 7pm. Takes Mirapex 1mg  three times day.  Takes Azilect 1mg  daily. Notes some mild wearing off and motor fluctuations.   Since last visit he has had kidney stones which were treated in April. After procedure he had severe pain which was attributed to spasms. He is having a hammer toe which is causing difficulty walking.    Initial visit 08/2013: Previous patient of Dr. Bernardo Heater, last seen 10/2011, at that time considering DBS, instructed to follow up with Dr Nicki Reaper at [facility] for further evaluation. Since last visit he also suffered a mechanical fall and hip fracture, spent time in rehab. Was in 5 year Cr study at [facility], finished around 1 year ago. He did not notice any change while on his medication. He was evaluated by a doctor at [facility], told to wait until his symptoms worsen.   Continues to have pain in right hip, is being evaluated by ortho as to cause of this. Has had kidney stones since last visit.   Since coming  out of rehab they feel that he is not moving as well and he is having increased tremors. He feels more stiff and that he is not moving as fast. They notice an increase in his tremors, especially when he is anxious or stressed.   Currently taking the Sinemet IR 4x a day, four hours a part. He feels he gets a good benefit from the Sinemet, notes some wearing off of his medication but unsure how long of duration he gets. No dyskinesias noted. He notices an increase in his freezing episodes, especially in the morning.   Currently taking Mirapex 1mg  three times a day and Azilect 1mg  daily. No noted compulsive behavior.    Per old records: Family hx of ET, PD, ALS. 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 Dx with OSA, uses a CPAP. Has some compulsive behavior.   Review of Systems: Out of a complete 14 system review, the patient complains of only the following symptoms, and all other reviewed systems are negative. + for tremor, gait instability, fatigue  History   Social History  . Marital Status: Married    Spouse Name: [place]    Number of Children: 1  . Years of Education: Bachelor's   Occupational History  . RETIRED    Social History Main Topics  . Smoking status: Never Smoker   . Smokeless tobacco:  Never Used  . Alcohol Use: No  . Drug Use: No  . Sexual Activity: Not on file   Other Topics Concern  . Not on file   Social History Narrative   Patient is married and lives at home with his wife ([place]), has 1 child   Patient is right handed   Education level is Bachelor's   Caffeine consumption is 3 cups daily    Family History  Problem Relation Age of Onset  . Cancer Mother   . Parkinson's disease      H/O  . Alzheimer's disease      H/O  . ALS      H/O    Past Medical History  Diagnosis Date  . Hypertension   . Anxiety   . Depression   . Parkinson's disease NEUROLOGIST--  DR Jim Like    IN A STUDY AT [facility]--- lov note crae everywhere  02-19-2013 dr Kalman Shan scott  . Headache(784.0)   . Spinal stenosis of lumbar region   . Degenerative disc disease, cervical   . OSA (obstructive sleep apnea)     SEVERE OSA PER STUDY 2005--  USES NASAL CANNULA  WITH SETTING AT 12 -- NO MASK  . Cervical spondylosis   . Right ureteral stone   . OA (osteoarthritis)   . Wears glasses   . Urinary incontinence     SECONDARY TO PARKINSON'S DISEASE    Past Surgical History  Procedure Laterality Date  . Total knee arthroplasty Left 03/04/2013    Procedure: LEFT TOTAL KNEE ARTHROPLASTY;  Surgeon: Gearlean Alf, MD;  Location: [facility];  Service: Orthopedics;  Laterality: Left;  . Hip pinning,cannulated Right 04/13/2013    Procedure: CANNULATED HIP PINNING;  Surgeon: Johnn Hai, MD;  Location: [facility];  Service: Orthopedics;  Laterality: Right;  PERCUTANEOUS CANNULATED RIGHT HIP PINNING   . Lumbar laminectomy  01-05-2010    L2  --  L5  . Knee arthroscopy w/ debridement Bilateral RIGHT  04-07-2008/   LEFT 10-13-2005  . Cholecystectomy  2000  . Cystoscopy with retrograde pyelogram, ureteroscopy and stent placement Right 11/13/2013    Procedure: CYSTOSCOPY WITH RIGHT URETEROSCOPY, Right Retrograde Pyelogram, RIGHT URETERAL STENT PLACEMENT, Basket Stone Extraction;  Surgeon: Ardis Hughs, MD;  Location: [facility];  Service: Urology;  Laterality: Right;  . Holmium laser application Right 12/18/2128    Procedure: RIGHT LASER LITHOTRIPSY;  Surgeon: Ardis Hughs, MD;  Location: [facility];  Service: Urology;  Laterality: Right;    Current Outpatient Prescriptions  Medication Sig Dispense Refill  . aspirin EC 81 MG tablet Take 81 mg by mouth every morning.      . carbidopa-levodopa (SINEMET IR) 25-100 MG per tablet Take 1 tablet by mouth 4 (four) times daily. May take an additional onehalf tab in the morning when you wake up to decrease freezing episode  405 tablet  1  . lisinopril (PRINIVIL,ZESTRIL) 10 MG  tablet Take 10 mg by mouth every morning.      Marland Kitchen LORazepam (ATIVAN) 1 MG tablet Take 1 tablet (1 mg total) by mouth 4 (four) times daily as needed for anxiety.  30 tablet  0  . oxyCODONE-acetaminophen (PERCOCET) 10-325 MG per tablet       . phenazopyridine (PYRIDIUM) 200 MG tablet Take 1 tablet (200 mg total) by mouth 3 (three) times daily as needed for pain.  10 tablet  0  . pramipexole (MIRAPEX) 1 MG tablet Take 1 tablet (1 mg  total) by mouth 3 (three) times daily.  270 tablet  1  . rasagiline (AZILECT) 1 MG TABS tablet Take 1 tablet (1 mg total) by mouth every morning.  90 tablet  1   No current facility-administered medications for this visit.    Allergies as of 01/03/2014  . (No Known Allergies)    Vitals: BP 148/86  Pulse 75  Ht 5\' 10"  (1.778 m)  Wt 241 lb (109.317 kg)  BMI 34.58 kg/m2 Last Weight:  Wt Readings from Last 1 Encounters:  01/03/14 241 lb (109.317 kg)   Last Height:   Ht Readings from Last 1 Encounters:  01/03/14 5\' 10"  (1.778 m)     Physical exam: Exam: Gen: NAD, conversant Eyes: anicteric sclerae, moist conjunctivae HENT: Atraumatic, oropharynx clear Neck: Trachea midline; supple,  Lungs: CTA, no wheezing, rales, rhonic                          CV: RRR, no MRG Abdomen: Soft, non-tender;  Extremities: No peripheral edema  Skin: Normal temperature, no rash,  Psych: Appropriate affect, pleasant  Neuro: MS: AA&Ox3, appropriately interactive, normal affect   Speech: fluent w/o paraphasic error  Memory: good recent and remote recall  CN: PERRL, EOMI no nystagmus, no ptosis, sensation intact to LT V1-V3 bilat, face symmetric, no weakness, hearing grossly intact, palate elevates symmetrically, shoulder shrug 5/5 bilat,  tongue protrudes midline, no fasiculations noted.  Motor: normal bulk and tone Strength: 5/5  In all extremities  Coord: Mild bilateral resting tremor, mild bradykinesia of finger taps, hand opening  Reflexes: symmetrical, bilat  downgoing toes  Sens: LT intact in all extremities  Gait: he deferred walking at this time due to hip discomfort   Assessment:  After physical and neurologic examination, review of laboratory studies, imaging, neurophysiology testing and pre-existing records, assessment will be reviewed on the problem list.  Plan:  Treatment plan and additional workup will be reviewed under Problem List.  1)Parkinson's disease 2)OSA 3)Lumbar back pain  62y/o gentleman presenting for follow up appointment with concerns over daytime fatigue, cognitive decline. Suspect his current concerns are mainly related to poor compliance with CPAP. Will refer to Dr Star Age for CPAP titration. Will hold off on making adjustments to PD medications pending evaluation with sleep specialist. In the future can consider titrating down Mirapex as this can commonly cause fatigue, will also consider transition to [place]. Follow up in 4 months.   Jim Like, DO  [facility] [address] [place], [place] [address]  Phone [phone] Fax [phone]

## 2014-01-20 ENCOUNTER — Encounter: Payer: Self-pay | Admitting: Neurology

## 2014-01-20 ENCOUNTER — Ambulatory Visit (INDEPENDENT_AMBULATORY_CARE_PROVIDER_SITE_OTHER): Payer: Medicare Other | Admitting: Neurology

## 2014-01-20 VITALS — BP 117/74 | HR 74 | Temp 98.6°F | Ht 70.0 in | Wt 245.0 lb

## 2014-01-20 DIAGNOSIS — G20 Parkinson's disease: Secondary | ICD-10-CM

## 2014-01-20 DIAGNOSIS — G4733 Obstructive sleep apnea (adult) (pediatric): Secondary | ICD-10-CM

## 2014-01-20 DIAGNOSIS — G4752 REM sleep behavior disorder: Secondary | ICD-10-CM

## 2014-01-20 DIAGNOSIS — Z9989 Dependence on other enabling machines and devices: Principal | ICD-10-CM

## 2014-01-20 NOTE — Patient Instructions (Addendum)
Based on your history you have obstructive sleep apnea or OSA. I think we should proceed with another sleep study to determine how severe it is. If you have more than mild OSA, I want you to consider treatment with CPAP. Please remember, the risks and ramifications of moderate to severe obstructive sleep apnea or OSA are: Cardiovascular disease, including congestive heart failure, stroke, difficult to control hypertension, arrhythmias, and even type 2 diabetes has been linked to untreated OSA. Sleep apnea causes disruption of sleep and sleep deprivation in most cases, which, in turn, can cause recurrent headaches, problems with memory, mood, concentration, focus, and vigilance. Most people with untreated sleep apnea report excessive daytime sleepiness, which can affect their ability to drive. Please do not drive if you feel sleepy.  I will see you back after your sleep study to go over the test results and where to go from there. We will call you after your sleep study and to set up an appointment at the time.  Please bring all your night time medications including your sleep aid. You may want to bring you lorazepam too. Also bring your machine for comparison.

## 2014-01-20 NOTE — Progress Notes (Signed)
Subjective:    Patient ID: James Schultz is a 63 y.o. male.  HPI    Star Age, MD, PhD [facility] [address]O. [place], [place] [address]  Dear Laurey Arrow,   I saw your patient, James Schultz, upon your kind request in my clinic today for initial consultation of his prior diagnosis of obstructive sleep apnea. The patient is unaccompanied today. As you know, James Schultz is a very pleasant 63 year old right-handed gentleman with an underlying medical history of Parkinson's disease diagnosed in 2007, depression, anxiety, lumbar spinal stenosis, cervical degenerative disc disease, kidney stone, osteoarthritis, s/p L TKA and s/p R hip surgery, who was diagnosed with severe sleep study some 10 years ago. He has been on nasal CPAP via nasal pillows mask. He reports cognitive decline as well as daytime tiredness and has not been fully compliant with CPAP therapy. He has panic attacks when he first puts the mask on. He has not used CPAP in over a year. He was diagnosed with what sounds like severe OSA some 10 years ago and had 2 sleep studies. I do not have the prior studies available for review. He brought in his CPAP machine which is an S8, round is set at 35 minutes, starting at 4 cm but I cannot determine the final pressure. There is no SD card and the machine. He uses a medium Swift LT nasal pillows mask which he actually likes and tolerates the past. He was not able to tolerate a nasal mask because of claustrophobia. Sometimes he takes 1 mg of Ativan at night. He does take Benadryl 50 mg each night. He has done this for years. He denies RLS Sx and does not tend to kick in his sleep. He has rare RBD type symptoms, provides description. He has never fallen out of bed. He did fall recently and scraped his left knee and his left face but did not lose consciousness or have any headaches. He does have residual right flank pain after his recent kidney stone  removal. He was told these are spasms. He is also awaiting an MRI of his right hip. He does suffer from lower back pain as well.  He goes to bed around 9:30 PM and falls asleep fairly quickly. He wakes up at 3:30 AM. He has to wear Depends at night. He snores moderately and has witnessed apneas. He also makes popping sounds. Denies palpitations, shortness of breath or chest pain. He denies morning HAs. He goes to the gym early in the mornings. He may doze off after lunch, and typically takes a naps between 7:30 to 9:30 AM.  His ESS is 12 today.  He consumes 3 caffeinated beverages per day, usually in the form of 3 Pepsi, not after 8 PM. His bedroom is usually dark and cool. There is a TV in the bedroom and usually it is not on at night.   His Past Medical History Is Significant For: Past Medical History  Diagnosis Date  . Hypertension   . Anxiety   . Depression   . Parkinson's disease NEUROLOGIST--  DR Jim Like    IN A STUDY AT [facility]--- lov note crae everywhere 02-19-2013 dr Kalman Shan scott  . Headache(784.0)   . Spinal stenosis of lumbar region   . Degenerative disc disease, cervical   . OSA (obstructive sleep apnea)     SEVERE OSA PER STUDY 2005--  USES NASAL CANNULA  WITH SETTING AT 12 -- NO MASK  .  Cervical spondylosis   . Right ureteral stone   . OA (osteoarthritis)   . Wears glasses   . Urinary incontinence     SECONDARY TO PARKINSON'S DISEASE    His Past Surgical History Is Significant For: Past Surgical History  Procedure Laterality Date  . Total knee arthroplasty Left 03/04/2013    Procedure: LEFT TOTAL KNEE ARTHROPLASTY;  Surgeon: Gearlean Alf, MD;  Location: [facility];  Service: Orthopedics;  Laterality: Left;  . Hip pinning,cannulated Right 04/13/2013    Procedure: CANNULATED HIP PINNING;  Surgeon: Johnn Hai, MD;  Location: [facility];  Service: Orthopedics;  Laterality: Right;  PERCUTANEOUS CANNULATED RIGHT HIP PINNING   . Lumbar laminectomy  01-05-2010    L2  --  L5  .  Knee arthroscopy w/ debridement Bilateral RIGHT  04-07-2008/   LEFT 10-13-2005  . Cholecystectomy  2000  . Cystoscopy with retrograde pyelogram, ureteroscopy and stent placement Right 11/13/2013    Procedure: CYSTOSCOPY WITH RIGHT URETEROSCOPY, Right Retrograde Pyelogram, RIGHT URETERAL STENT PLACEMENT, Basket Stone Extraction;  Surgeon: Ardis Hughs, MD;  Location: [facility];  Service: Urology;  Laterality: Right;  . Holmium laser application Right 01/21/4853    Procedure: RIGHT LASER LITHOTRIPSY;  Surgeon: Ardis Hughs, MD;  Location: [facility];  Service: Urology;  Laterality: Right;    His Family History Is Significant For: Family History  Problem Relation Age of Onset  . Cancer Mother   . Parkinson's disease      H/O  . Alzheimer's disease      H/O  . ALS      H/O    His Social History Is Significant For: History   Social History  . Marital Status: Married    Spouse Name: [place]    Number of Children: 1  . Years of Education: Bachelor's   Occupational History  . RETIRED    Social History Main Topics  . Smoking status: Never Smoker   . Smokeless tobacco: Never Used  . Alcohol Use: No  . Drug Use: No  . Sexual Activity: None   Other Topics Concern  . None   Social History Narrative   Patient is married and lives at home with his wife ([place]), has 1 child   Patient is right handed   Education level is Bachelor's   Caffeine consumption is 3 cups daily    His Allergies Are:  No Known Allergies:   His Current Medications Are:  Outpatient Encounter Prescriptions as of 01/20/2014  Medication Sig  . aspirin EC 81 MG tablet Take 81 mg by mouth every morning.  . carbidopa-levodopa (SINEMET IR) 25-100 MG per tablet Take 1 tablet by mouth 4 (four) times daily. May take an additional onehalf tab in the morning when you wake up to decrease freezing episode  . lisinopril (PRINIVIL,ZESTRIL) 10 MG tablet Take 10 mg by mouth every  morning.  Marland Kitchen LORazepam (ATIVAN) 1 MG tablet Take 1 tablet (1 mg total) by mouth 4 (four) times daily as needed for anxiety.  Marland Kitchen oxyCODONE-acetaminophen (PERCOCET) 10-325 MG per tablet   . phenazopyridine (PYRIDIUM) 200 MG tablet Take 1 tablet (200 mg total) by mouth 3 (three) times daily as needed for pain.  . pramipexole (MIRAPEX) 1 MG tablet Take 1 tablet (1 mg total) by mouth 3 (three) times daily.  . rasagiline (AZILECT) 1 MG TABS tablet Take 1 tablet (1 mg total) by mouth every morning.  :  Review of Systems:  Out of  a complete 14 point review of systems, all are reviewed and negative with the exception of these symptoms as listed below:   Review of Systems  Constitutional: Positive for fatigue.  HENT: Negative.   Eyes: Negative.   Respiratory: Negative.   Cardiovascular: Negative.   Gastrointestinal: Positive for constipation.       Incontinence  Endocrine: Positive for polydipsia.  Genitourinary:       Impotence  Musculoskeletal: Positive for arthralgias.       Cramps  Skin: Negative.   Allergic/Immunologic: Negative.   Neurological: Positive for tremors and numbness.  Hematological: Negative.   Psychiatric/Behavioral: Positive for confusion, sleep disturbance (insomnia, snoring) and dysphoric mood.    Objective:  Neurologic Exam  Physical Exam Physical Examination:   Filed Vitals:   01/20/14 0838  BP: 117/74  Pulse: 74  Temp: 98.6 F (37 C)    General Examination: The patient is a very pleasant 63 y.o. male in no acute distress.  HEENT: Normocephalic, atraumatic, pupils are equal, round and reactive to light and accommodation. Funduscopic exam is normal with sharp disc margins noted. Extraocular tracking shows mild saccadic breakdown without nystagmus noted. There is limitation to upper gaze. There is mild decrease in eye blink rate. Hearing is intact. Tympanic membranes are clear bilaterally. Face is symmetric with mild facial masking and normal facial sensation.  There is no lip, neck or jaw tremor. Neck is mildly rigid with intact passive ROM. There are no carotid bruits on auscultation. Oropharynx exam reveals moderate mouth dryness. Airway is quite narrow. Next size is 18 and three-quarter inches. Uvula is large, tongue is small in size, tonsils are small, right-sided a little bigger than left. Mallampati is class III. Tongue protrudes centrally and palate elevates symmetrically.   There is no drooling.   Chest: is clear to auscultation without wheezing, rhonchi or crackles noted.  Heart: sounds are regular and normal without murmurs, rubs or gallops noted.   Abdomen: is soft, non-tender and non-distended with normal bowel sounds appreciated on auscultation.  Extremities: There is no pitting edema in the distal lower extremities bilaterally. Pedal pulses are intact. There are no varicose veins.  Skin: is warm and dry with no trophic changes noted. Age-related changes are noted on the skin.   Musculoskeletal: exam reveals no obvious joint deformities, tenderness, joint swelling or erythema.  Neurologically:  Mental status: The patient is awake and alert, paying good  attention. He is able to completely provide the history. His wife provides details. He is oriented to: person, place, time/date, situation, day of week, month of year and year. His memory, attention, language and knowledge are intact. There is no aphasia, agnosia, apraxia or anomia. There is a no significant degree of bradyphrenia. Speech is mildly hypophonic with no dysarthria noted. Mood is congruent and affect is normal.  Cranial nerves are as described above under HEENT exam. In addition, shoulder shrug is normal with equal shoulder height noted.  Motor exam: Normal bulk, and strength for age is noted. There are no dyskinesias noted. Tone is mildly rigid with presence of cogwheeling in the right upper extremity. There is overall mild bradykinesia. There is no drift or rebound.  There is  an intermittent mild to moderate resting tremor, LUE more than RUE. Romberg is negative, but with mild sway.   Reflexes are 1+.  Fine motor skills exam: Finger taps are mildly impaired on the right and mildly impaired on the left. Hand movements are mildly impaired on the right and mildly  impaired on the left. RAP (rapid alternating patting) is mildly impaired on the right and mildly impaired on the left. Foot taps are moderately impaired on the right and mildly impaired on the left. Foot agility (in the form of heel stomping) is moderately impaired on the right and mildly impaired on the left.    Cerebellar testing shows no dysmetria or intention tremor on finger to nose testing.   Sensory exam is intact to light touch.   Gait, station and balance: He stands up from the seated position with moderate difficulty and does need to push up with His hands. He needs no assistance, but takes 3 attempts. No veering to one side is noted. He is not noted to lean to the side. Posture is moderately stooped. Stance is wide-based. He walks with decrease in stride length and pace and decreased arm swing. He turns in 3 steps. Tandem walk is not possible. Balance is mildly impaired. He is not able to do a toe or heel stance.      Assessment and Plan:   In summary, TAMIR WALLMAN is a very pleasant 63 y.o.-year old right-handed gentleman with an underlying medical history of Parkinson's disease diagnosed in 2007, depression, anxiety, lumbar spinal stenosis, cervical degenerative disc disease, kidney stone, osteoarthritis, s/p L TKA and s/p R hip surgery, and obesity, who was diagnosed with severe sleep study some 10 years ago - provides verbal report, his AHI was about 50 per hour. He did weigh approximately 20 pounds more at the time. His history and physical exam are still concerning for obstructive sleep apnea. In addition, he has evidence of mild REM sleep disorder. He has to take an over-the-counter sleep aid  almost every night. Approximately 2 times per week he will take Ativan at night when he has a panic attack. He is advised that he would benefit from coming back for another sleep study, get readjusted with respect to his pressure and reevaluated as far as the severe he of his sleep apnea goes. He is advised that treating his sleep apnea may have a positive impact on his Parkinson's symptoms as he would benefit from more consolidated sleep and better oxygenation at night. He is furthermore advised to reduce his caffeine intake and try not to utilize any caffeine after 5 PM. He may want to gradually move up his last caffeine beverage to approximately 3 PM. He is tentatively scheduled for sleep study this week, pending insurance approval. He has not used CPAP in over a year because of air leak an uncomfortable sensation. He may improve in this regard as we reevaluate and refit him with CPAP. I do believe that he will likely need CPAP therapy. He has a fairly narrow appearing airway and a large uvula. Desc; male/male:[id number]} with a history and physical exam concerning for obstructive sleep apnea (OSA). I had a long chat with the patient and his wife about my findings and the diagnosis of OSA, its prognosis and treatment options. We talked about medical treatments, surgical interventions and non-pharmacological approaches. I explained in particular the risks and ramifications of untreated moderate to severe OSA, especially with respect to developing cardiovascular disease down the Road, including congestive heart failure, difficult to treat hypertension, cardiac arrhythmias, or stroke. Even type 2 diabetes has, in part, been linked to untreated OSA. Symptoms of untreated OSA include daytime sleepiness, memory problems, mood irritability and mood disorder such as depression and anxiety, lack of energy, as well as recurrent headaches, especially morning headaches.  We talked about trying to maintain a healthy lifestyle in  general, as well as the importance of weight control. I encouraged the patient to eat healthy, exercise daily and keep well hydrated, to keep a scheduled bedtime and wake time routine, to not skip any meals and eat healthy snacks in between meals. I advised the patient not to drive when feeling sleepy. I recommended the following at this time: sleep study with potential positive airway pressure titration.  I explained the sleep test procedure to the patient and also outlined possible surgical and non-surgical treatment options of OSA, including the use of a custom-made dental device (which would require a referral to a specialist dentist or oral surgeon), upper airway surgical options, such as pillar implants, radiofrequency surgery, tongue base surgery, and UPPP (which would involve a referral to an ENT surgeon). Rarely, jaw surgery such as mandibular advancement may be considered.  I also explained the CPAP treatment option to the patient, who indicated that he would be willing to try CPAP if the need arises. I explained the importance of being compliant with PAP treatment, not only for insurance purposes but primarily to improve His symptoms, and for the patient's long term health benefit, including to reduce His cardiovascular risks. I answered all their questions today and the patient and his wife were in agreement. I would like to see him back after the sleep study is completed and encouraged them to call with any interim questions, concerns, problems or updates.   Thank you very much for allowing me to participate in the care of this nice patient. If I can be of any further assistance to you please do not hesitate to call me at [phone].  Sincerely,   Star Age, MD, PhD

## 2014-01-21 ENCOUNTER — Other Ambulatory Visit: Payer: Self-pay | Admitting: Neurosurgery

## 2014-01-21 DIAGNOSIS — M47817 Spondylosis without myelopathy or radiculopathy, lumbosacral region: Secondary | ICD-10-CM

## 2014-01-24 ENCOUNTER — Ambulatory Visit (INDEPENDENT_AMBULATORY_CARE_PROVIDER_SITE_OTHER): Payer: Medicare Other | Admitting: Neurology

## 2014-01-24 DIAGNOSIS — G4733 Obstructive sleep apnea (adult) (pediatric): Secondary | ICD-10-CM

## 2014-01-24 DIAGNOSIS — R9431 Abnormal electrocardiogram [ECG] [EKG]: Secondary | ICD-10-CM

## 2014-01-24 DIAGNOSIS — Z9989 Dependence on other enabling machines and devices: Principal | ICD-10-CM

## 2014-01-24 DIAGNOSIS — G20 Parkinson's disease: Secondary | ICD-10-CM

## 2014-02-07 ENCOUNTER — Telehealth: Payer: Self-pay | Admitting: Neurology

## 2014-02-07 DIAGNOSIS — G4733 Obstructive sleep apnea (adult) (pediatric): Secondary | ICD-10-CM

## 2014-02-07 NOTE — Telephone Encounter (Signed)
I called and left a message for the patient about his recent sleep study results. I informed the patient that the study confirmed the diagnosis of obstructive sleep apnea and that he did well on CPAP during the night of his study. Dr. Rexene Alberts recommend starting CPAP therapy at home. I will send the order to a durable medical equipment company of the patient choice, when he calls back to the office to give the name of the company. I will send Dr. Janann Colonel and Dr. Woody Seller a copy of the report, also send a copy to the patient along with a follow up instruction letter.

## 2014-02-07 NOTE — Telephone Encounter (Signed)
Please call and notify patient that the recent sleep study confirmed the diagnosis of OSA. He did well with CPAP during the study with significant improvement of the respiratory events. Therefore, I would like start the patient on CPAP at home. I placed the order in the chart. \  Arrange for CPAP set up at home through a DME company of patient's choice (he may have a prior DME company) and fax/route report to PCP and referring MD (if other than PCP).   The patient will also need a follow up appointment with me in 6-8 weeks post set up that has to be scheduled; help the patient schedule this (in a follow-up slot).   Please re-enforce the importance of compliance with treatment and the need for [place] to monitor compliance data.   Once you have spoken to the patient and scheduled the return appointment, you may close this encounter, thanks,   Star Age, MD, PhD [facility] ([place])

## 2014-02-10 ENCOUNTER — Encounter: Payer: Self-pay | Admitting: *Deleted

## 2014-02-10 ENCOUNTER — Ambulatory Visit
Admission: RE | Admit: 2014-02-10 | Discharge: 2014-02-10 | Disposition: A | Discharge status: Home / Self Care | Payer: Medicare Other | Source: Ambulatory Visit | Attending: Neurosurgery | Admitting: Neurosurgery

## 2014-02-10 DIAGNOSIS — M47817 Spondylosis without myelopathy or radiculopathy, lumbosacral region: Secondary | ICD-10-CM

## 2014-02-10 NOTE — Telephone Encounter (Signed)
I called and spoke with the patient about his choice of DME company. Patient will be going with [employer].

## 2014-03-03 ENCOUNTER — Telehealth: Payer: Self-pay | Admitting: Neurology

## 2014-03-03 NOTE — Telephone Encounter (Signed)
Patient's wife calling to request a follow up appointment with Dr. Rexene Alberts following sleep study. Please return call and advise.

## 2014-03-04 NOTE — Telephone Encounter (Signed)
Called pt and spoke with pt's wife [place] about making an f/u appt with Dr. Rexene Alberts, after sleep test. Wife stated that pt got the wrong CPAP machine and is waiting to get the correct one that [place] worked out for the pt. I informed her that Dr. Rexene Alberts does not have anything open until after pt's f/u with Dr. Janann Colonel in September. Per phone note on 02/07/14, Dr. Rexene Alberts wanted to see pt back in 6-8 wks, but because of the the wrong CPAP machine, did not know when you would like for the pt to come in. Would you like for the pt to be scheduled in another slot. Please advise

## 2014-03-05 NOTE — Telephone Encounter (Signed)
Called pt and spoke with pt's wife [place] informing her per Dr. Rexene Alberts to set up a 6-8 weeks f/u appt from set up with CPAP machine. Wife stated that they have not gotten the correct machine yet. I advised the wife to give our office a call back once pt has received the correct machine to set up f/u with Dr. Rexene Alberts. Wife verbalized understanding. FYI

## 2014-03-05 NOTE — Telephone Encounter (Signed)
FU after 6-8 weeks from set up with CPAP. Once he has the right machine, he needs to see me in that window, ok to utilize a new pt slot and move current appt if need be, thx

## 2014-03-19 NOTE — Telephone Encounter (Signed)
Noted  

## 2014-04-18 ENCOUNTER — Encounter: Payer: Self-pay | Admitting: Neurology

## 2014-04-30 ENCOUNTER — Encounter: Payer: Self-pay | Admitting: Neurology

## 2014-04-30 NOTE — Progress Notes (Signed)
Quick Note:  I reviewed the patient's CPAP compliance data from 03/19/2014 to 04/17/2014, which is a total of 30 days, during which time the patient used CPAP every day. The average usage for all days was 6 hours and 24 minutes. The percent used days greater than 4 hours was 90 %, indicating excellent compliance. The residual AHI was 6 per hour, indicating a slightly suboptimal treatment pressure of 8 cwp with EPR of 3. Air leak from the mask was low at 7 L per minute at the 95th percentile. He may need an increase in his pressure. I will review this data with the patient at the next office visit, which is currently routinely scheduled for 05/02/2014 at 10:30 AM, provide feedback and additional troubleshooting if need be.  Star Age, MD, PhD [facility] ([facility])   ______

## 2014-05-02 ENCOUNTER — Ambulatory Visit (INDEPENDENT_AMBULATORY_CARE_PROVIDER_SITE_OTHER): Payer: Medicare Other | Admitting: Neurology

## 2014-05-02 ENCOUNTER — Encounter: Payer: Self-pay | Admitting: Neurology

## 2014-05-02 ENCOUNTER — Encounter (INDEPENDENT_AMBULATORY_CARE_PROVIDER_SITE_OTHER): Payer: Self-pay

## 2014-05-02 VITALS — BP 117/76 | HR 76 | Temp 97.6°F | Ht 70.0 in | Wt 235.0 lb

## 2014-05-02 DIAGNOSIS — Q619 Cystic kidney disease, unspecified: Secondary | ICD-10-CM

## 2014-05-02 DIAGNOSIS — G4733 Obstructive sleep apnea (adult) (pediatric): Secondary | ICD-10-CM

## 2014-05-02 DIAGNOSIS — Z9989 Dependence on other enabling machines and devices: Principal | ICD-10-CM

## 2014-05-02 DIAGNOSIS — M79609 Pain in unspecified limb: Secondary | ICD-10-CM

## 2014-05-02 DIAGNOSIS — M79672 Pain in left foot: Secondary | ICD-10-CM

## 2014-05-02 DIAGNOSIS — M25559 Pain in unspecified hip: Secondary | ICD-10-CM

## 2014-05-02 DIAGNOSIS — N281 Cyst of kidney, acquired: Secondary | ICD-10-CM

## 2014-05-02 DIAGNOSIS — G20 Parkinson's disease: Secondary | ICD-10-CM

## 2014-05-02 DIAGNOSIS — G4752 REM sleep behavior disorder: Secondary | ICD-10-CM

## 2014-05-02 DIAGNOSIS — M25551 Pain in right hip: Secondary | ICD-10-CM

## 2014-05-02 NOTE — Patient Instructions (Addendum)
Please continue using your CPAP regularly. While your insurance requires that you use CPAP at least 4 hours each night on 70% of the nights, I recommend, that you not skip any nights and use it throughout the night if you can. Getting used to CPAP and staying with the treatment long term does take time and patience and discipline. Untreated obstructive sleep apnea when it is moderate to severe can have an adverse impact on cardiovascular health and raise her risk for heart disease, arrhythmias, hypertension, congestive heart failure, stroke and diabetes. Untreated obstructive sleep apnea causes sleep disruption, nonrestorative sleep, and sleep deprivation. This can have an impact on your day to day functioning and cause daytime sleepiness and impairment of cognitive function, memory loss, mood disturbance, and problems focussing. Using CPAP regularly can improve these symptoms.  Keep up the good work! I will see you back in 6 months for sleep apnea check up. We are going to increase your CPAP pressure slightly to 9 cm.   Talk to Dr. Woody Seller about trying a different antidepressant.

## 2014-05-02 NOTE — Progress Notes (Signed)
Subjective:    Patient ID: James Schultz is a 63 y.o. male.  HPI    Interim history:   James Schultz is a 63 year old right-handed gentleman with an underlying medical history of Parkinson's disease diagnosed in 2007, depression, anxiety, lumbar spinal stenosis, cervical degenerative disc disease, kidney stone, osteoarthritis, s/p L TKA and s/p R hip surgery, who presents for followup consultation of his obstructive sleep apnea, after his recent sleep study. He is accompanied by his wife again today. I first met him on 01/20/2014 at the request of Dr. Janann Colonel, at which time the patient reported that he was diagnosed with severe obstructive sleep apnea some 10 years ago. He reported being on CPAP but having difficulty tolerating CPAP. He reported cognitive decline as well as daytime somnolence and admitted to not be fully compliant with CPAP therapy. He reported panic attacks when he first put the mask on. He admitted not to have used CPAP in over a year. I suggested he return for a sleep study. 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 He was taking Benadryl 50 mg each night. He had been doing this for years. He had a split-night sleep study on 01/24/2014 and went over his test results with him in detail today. Baseline sleep efficiency was 92.9% with a latency to sleep of 2.5 minutes and wake after sleep onset of 4 minutes with no significant fragmentation noted. He had a normal arousal index. He had an increased percentage of stage II sleep, absence of deep sleep and absence of REM sleep. He had PACs and PVCs on EKG and no EEG changes. He had no significant PLMS. His total AHI was highly elevated at 45.2 events per hour. Baseline oxygen saturation was 93% with a nadir of 88%. He was titrated on CPAP during the rest of the test. Sleep efficiency was 90.6%. Arousal index was normal. He had an increased percentage of stage II sleep, absence of deep sleep and a normal percentage of REM sleep.  Average oxygen saturation was 93% with a nadir of 88%. He had no significant periodic leg movements. He was titrated from 5-8 cm. He had some central apneas on the final pressure of 8 cm. Based on the overall test results I prescribed CPAP at a pressure of 8 cm using large nasal pillows.  Today, I reviewed the patient's CPAP compliance data from 03/19/2014 to 04/17/2014, which is a total of 30 days, during which time the patient used CPAP every day. The average usage for all days was 6 hours and 24 minutes. The percent used days greater than 4 hours was 90 %, indicating excellent compliance. The residual AHI was 6 per hour, indicating a slightly suboptimal treatment pressure of 8 cwp with EPR of 3. Air leak from the mask was low at 7 L per minute at the 95th percentile. He may need an increase in his pressure, I felt.  Today, he reports, that he did not know how to use the first machine he was issued, so he did not use it well. He changed to another DME provider and another machine, and since then has done very well and feels improved with his sleep. His wife agrees and he is fully compliant. He still opens his mouth some and has a chinstrap. He has been more depressed and has been tried on Wellbutrin in the past, but got worse. He still has chronic back pain and R hip pain. He also has foot pain bilaterally, left more than right.  His Past Medical History Is Significant For: Past Medical History  Diagnosis Date  . Hypertension   . Anxiety   . Depression   . Parkinson's disease NEUROLOGIST--  DR James Schultz    IN A STUDY AT [facility]--- lov note crae everywhere 02-19-2013 dr Kalman Schultz James  . Headache(784.0)   . Spinal stenosis of lumbar region   . Degenerative disc disease, cervical   . OSA (obstructive sleep apnea)     SEVERE OSA PER STUDY 2005--  USES NASAL CANNULA  WITH SETTING AT 12 -- NO MASK  . Cervical spondylosis   . Right ureteral stone   . OA (osteoarthritis)   . Wears glasses   . Urinary  incontinence     SECONDARY TO PARKINSON'S DISEASE    His Past Surgical History Is Significant For: Past Surgical History  Procedure Laterality Date  . Total knee arthroplasty Left 03/04/2013    Procedure: LEFT TOTAL KNEE ARTHROPLASTY;  Surgeon: James Alf, MD;  Location: [facility];  Service: Orthopedics;  Laterality: Left;  . Hip pinning,cannulated Right 04/13/2013    Procedure: CANNULATED HIP PINNING;  Surgeon: James Hai, MD;  Location: [facility];  Service: Orthopedics;  Laterality: Right;  PERCUTANEOUS CANNULATED RIGHT HIP PINNING   . Lumbar laminectomy  01-05-2010    L2  --  L5  . Knee arthroscopy w/ debridement Bilateral RIGHT  04-07-2008/   LEFT 10-13-2005  . Cholecystectomy  2000  . Cystoscopy with retrograde pyelogram, ureteroscopy and stent placement Right 11/13/2013    Procedure: CYSTOSCOPY WITH RIGHT URETEROSCOPY, Right Retrograde Pyelogram, RIGHT URETERAL STENT PLACEMENT, Basket Stone Extraction;  Surgeon: James Hughs, MD;  Location: [facility];  Service: Urology;  Laterality: Right;  . Holmium laser application Right 0/09/6376    Procedure: RIGHT LASER LITHOTRIPSY;  Surgeon: James Hughs, MD;  Location: [facility];  Service: Urology;  Laterality: Right;    His Family History Is Significant For: Family History  Problem Relation Age of Onset  . Cancer Mother   . Parkinson's disease      H/O  . Alzheimer's disease      H/O  . ALS      H/O    His Social History Is Significant For: History   Social History  . Marital Status: Married    Spouse Name: James Schultz    Number of Children: 1  . Years of Education: Bachelor's   Occupational History  . RETIRED    Social History Main Topics  . Smoking status: Never Smoker   . Smokeless tobacco: Never Used  . Alcohol Use: No  . Drug Use: No  . Sexual Activity: None   Other Topics Concern  . None   Social History Narrative   Patient is married and lives at home with his  wife (James Schultz), has 1 child   Patient is right handed   Education level is Bachelor's   Caffeine consumption is 3 cups daily    His Allergies Are:  No Known Allergies:   His Current Medications Are:  Outpatient Encounter Prescriptions as of 05/02/2014  Medication Sig  . aspirin EC 81 MG tablet Take 81 mg by mouth every morning.  . carbidopa-levodopa (SINEMET IR) 25-100 MG per tablet Take 1 tablet by mouth 4 (four) times daily. May take an additional onehalf tab in the morning when you wake up to decrease freezing episode  . lisinopril (PRINIVIL,ZESTRIL) 5 MG tablet Take 5 mg by mouth daily.  Marland Kitchen LORazepam (ATIVAN)  1 MG tablet Take 1 tablet (1 mg total) by mouth 4 (four) times daily as needed for anxiety.  Marland Kitchen OVER THE COUNTER MEDICATION 25 mg. Equate night time sleep aid, daily at night  . oxyCODONE-acetaminophen (PERCOCET) 10-325 MG per tablet   . phenazopyridine (PYRIDIUM) 200 MG tablet Take 1 tablet (200 mg total) by mouth 3 (three) times daily as needed for pain.  . pramipexole (MIRAPEX) 1 MG tablet Take 1 tablet (1 mg total) by mouth 3 (three) times daily.  . rasagiline (AZILECT) 1 MG TABS tablet Take 1 tablet (1 mg total) by mouth every morning.  . vitamin E 400 UNIT capsule Take by mouth 2 (two) times daily.  . [DISCONTINUED] lisinopril (PRINIVIL,ZESTRIL) 10 MG tablet Take 10 mg by mouth every morning.  :  Review of Systems:  Out of a complete 14 point review of systems, all are reviewed and negative with the exception of these symptoms as listed below:   Review of Systems  Constitutional:       Excessive sweating  HENT: Positive for drooling.   Eyes:       Double vision  Genitourinary: Positive for urgency.       Incontinence of bladder  Musculoskeletal: Positive for back pain and gait problem.       Muscle cramps  Neurological: Positive for dizziness, tremors and numbness.  Hematological: Bruises/bleeds easily.  Psychiatric/Behavioral:       Depression    Objective:   Neurologic Exam  Physical Exam Physical Examination:   Filed Vitals:   05/02/14 1006  BP: 117/76  Pulse: 76  Temp: 97.6 F (36.4 C)    General Examination: The patient is a very pleasant 63 y.o. male in no acute distress.  HEENT: Normocephalic, atraumatic, pupils are equal, round and reactive to light and accommodation. Funduscopic exam is normal with sharp disc margins noted. Extraocular tracking shows mild saccadic breakdown without nystagmus noted. There is limitation to upper gaze. There is mild decrease in eye blink rate. Hearing is intact. Face is symmetric with mild facial masking and normal facial sensation. There is no lip, neck or jaw tremor. Neck is mildly rigid with intact passive ROM. There are no carotid bruits on auscultation. Oropharynx exam reveals moderate mouth dryness. Airway is quite narrow. Uvula is large, tongue is small in size, tonsils are small, right-sided a little bigger than left. Mallampati is class III. Tongue protrudes centrally and palate elevates symmetrically. There is no drooling.   Chest: is clear to auscultation without wheezing, rhonchi or crackles noted.  Heart: sounds are regular and normal without murmurs, rubs or gallops noted.   Abdomen: is soft, non-tender and non-distended with normal bowel sounds appreciated on auscultation.  Extremities: There is no pitting edema in the distal lower extremities bilaterally. Pedal pulses are intact. There are no varicose veins.  Skin: is warm and dry with no trophic changes noted. Age-related changes are noted on the skin.   Musculoskeletal: exam reveals no obvious joint deformities, tenderness, joint swelling or erythema.  Neurologically:  Mental status: The patient is awake and alert, paying good  attention. He is able to completely provide the history. His wife provides details. He is oriented to: person, place, time/date, situation, day of week, month of year and year. His memory, attention, language  and knowledge are intact. There is no aphasia, agnosia, apraxia or anomia. There is a no significant degree of bradyphrenia. Speech is mildly hypophonic with no dysarthria noted. Mood is congruent and affect is normal.  Cranial nerves are as described above under HEENT exam. In addition, shoulder shrug is normal with equal shoulder height noted.  Motor exam: Normal bulk, and strength for age is noted. There are no dyskinesias noted. Tone is mildly rigid with presence of cogwheeling in the right upper extremity. There is overall mild bradykinesia. There is no drift or rebound.  There is an intermittent mild to moderate resting tremor, LUE more than RUE. Romberg is negative, but with mild sway.   Reflexes are 1+.  Fine motor skills exam: Finger taps are mildly impaired on the right and mildly impaired on the left. Hand movements are mildly impaired on the right and mildly impaired on the left. RAP (rapid alternating patting) is mildly impaired on the right and mildly impaired on the left. Foot taps are moderately impaired on the right and mildly impaired on the left. Foot agility (in the form of heel stomping) is moderately impaired on the right and mildly impaired on the left.    Cerebellar testing shows no dysmetria or intention tremor on finger to nose testing.   Sensory exam is intact to light touch.   Gait, station and balance: He stands up from the seated position with moderate difficulty and does need to push up with His hands. He needs no assistance, but takes 3 attempts. No veering to one side is noted. He is not noted to lean to the side. Posture is moderately stooped. Stance is wide-based. He walks with decrease in stride length and pace and decreased arm swing. He turns in 3 steps. Tandem walk is not possible. Balance is mildly impaired. He is not able to do a toe or heel stance.     Assessment and Plan:   In summary, James Schultz is a very pleasant 63 year old right-handed gentleman  with an underlying medical history of Parkinson's disease diagnosed in 2007, depression, anxiety, lumbar spinal stenosis, cervical degenerative disc disease, kidney stone, osteoarthritis, s/p L TKA and s/p R hip surgery, and obesity, who presents for followup consultation of his obstructive sleep apnea, recently confirmed with a split-night sleep study. I talked to him and his wife in detail about his sleep study report as well as his compliance data. He is congratulated on his full compliance. Thankfully, he also feels much improved with regards to sleep and has daytime somnolence and his sleep quality. Unfortunately, he still suffers from chronic pain particularly in his right hip, lower and also both feet. His Parkinson's is stable. I did talk to him and his wife at length about his mood. He still has residual depression and he was tried on Wellbutrin in the past maybe a year ago and had side effects from it. He is encouraged to talk to his primary care physician about trying another antidepressant. This may also help his chronic pain as an adjunct. At this juncture, would Schultz to see if we can improve his residual AHI with increasing his pressure to 9 cm. We will fax this to his DME company and I would Schultz to see him back in 6 months from now, sooner if the need arises. He and his wife were in agreement.

## 2014-05-06 ENCOUNTER — Ambulatory Visit: Payer: Medicare Other | Admitting: Neurology

## 2014-05-30 ENCOUNTER — Other Ambulatory Visit: Payer: Self-pay

## 2014-06-05 NOTE — Progress Notes (Signed)
Quick Note:  I reviewed the patient's CPAP compliance data from 03/19/2014 to 04/17/2014, which is a total of 30 days, during which time the patient used CPAP every day. The average usage for all days was 6 hours and 24 minutes. The percent used days greater than 4 hours was 90 %, indicating excellent compliance. The residual AHI was 6 per hour, indicating a slightly suboptimal treatment pressure of 8 cwp with EPR of 3. Air leak from the mask was low. I will review this data with the patient at the next office visit, which is currently routinely scheduled for 10/31/2014 and 12 PM, provide feedback and additional troubleshooting if need be.  Star Age, MD, PhD [facility] ([facility]   ______

## 2014-09-15 ENCOUNTER — Encounter: Payer: Self-pay | Admitting: Neurology

## 2014-09-17 ENCOUNTER — Ambulatory Visit (INDEPENDENT_AMBULATORY_CARE_PROVIDER_SITE_OTHER): Payer: 59 | Admitting: Neurology

## 2014-09-17 ENCOUNTER — Encounter: Payer: Self-pay | Admitting: Neurology

## 2014-09-17 VITALS — BP 123/69 | HR 74 | Temp 97.9°F | Ht 70.0 in | Wt 253.0 lb

## 2014-09-17 DIAGNOSIS — Z9989 Dependence on other enabling machines and devices: Secondary | ICD-10-CM

## 2014-09-17 DIAGNOSIS — G20 Parkinson's disease: Secondary | ICD-10-CM

## 2014-09-17 DIAGNOSIS — G4733 Obstructive sleep apnea (adult) (pediatric): Secondary | ICD-10-CM

## 2014-09-17 MED ORDER — RASAGILINE MESYLATE 1 MG PO TABS: 1.0000 mg | ORAL_TABLET | Freq: Every morning | ORAL | Status: DC

## 2014-09-17 MED ORDER — CARBIDOPA-LEVODOPA 25-100 MG PO TABS: 1.0000 | ORAL_TABLET | Freq: Four times a day (QID) | ORAL | Status: DC

## 2014-09-17 MED ORDER — PRAMIPEXOLE DIHYDROCHLORIDE 1 MG PO TABS: 1.0000 mg | ORAL_TABLET | Freq: Three times a day (TID) | ORAL | Status: DC

## 2014-09-17 NOTE — Progress Notes (Signed)
Subjective:    Patient ID: James Schultz is a 64 y.o. male.  HPI     Interim history:  James Schultz is a 64 year old right-handed gentleman with an underlying medical history of Parkinson's disease diagnosed in 2007, depression, anxiety, lumbar spinal stenosis, cervical degenerative disc disease, kidney stone, osteoarthritis, s/p L TKA and s/p R hip surgery, who presents for followup consultation of his PD and obstructive sleep apnea. He is accompanied by his wife again today. I last saw him on 05/02/2014, at which time he reported that he initially did not know how to use the CPAP machine but after he changed to another DME provider and received another machine, he was using it well. He felt improved with his sleep. He was compliant with treatment. He reported chronic back pain and right hip pain. He also reported foot pain and some depressive symptoms. He was tried on Wellbutrin in the past but felt worse.  He used to see Dr. Jim Like for his Parkinson's disease and was last seen by him on 01/03/2014, at which time he was taking Sinemet 4 times a day, starting at 7 AM every 4 hours. He was also on Mirapex 1 mg strength one pill 3 times a day and Azilect 1 mg once daily. He had mild wearing off symptoms of motor fluctuations. He had developed kidney stones which were treated in April 2015. He was first seen by Dr. Janann Colonel and January 2015 and prior to that he used to see Dr. Morene Antu and was last seen by him in March 2013 at which time they discussed potential DBS treatment. He was instructed to follow-up with Dr. Nicki Reaper at [facility] for further evaluation. He had participated in a Parkinson's disease study at [facility] for 5 years which she finished in 2014.   Today, I reviewed his compliance data from 08/16/2014 through 09/14/2014 which is a total of 30 days during which time he used his CPAP every day except for 2 days with percent used days greater than 4 hours of only 50%, indicating suboptimal  compliance with an average usage of 3 hours and 41 minutes, residual AHI acceptable at 4.9 per hour and leak acceptable with the 95th percentile at 12.5 L/m. Pressure at 9 cm with EPR of 3.  Today, he report feeling well with CPAP at the problem is that he has a tendency to have to get up at night because of incontinence issues and because he has to go to the bathroom to change and he does not want to disturb his wife and climb back into bed which is why he then goes to a different room and sleeps in the recliner for the rest of the night. His wife has been very encouraging and even offered that she sleep with a different bedroom but he does not want for her to do that. Therefore, while he does use CPAP almost every night he does not always end up using it for enough hours. He does endorse sleeping better when he uses it. His Parkinson's for the most part seems stable but he does have more freezing and more tremors.  I first met him on 01/20/2014 at the request of Dr. Janann Colonel, at which time the patient reported that he was diagnosed with severe obstructive sleep apnea some 10 years ago. He reported being on CPAP but having difficulty tolerating CPAP. He reported cognitive decline as well as daytime somnolence and admitted to not be fully compliant with CPAP therapy. He reported panic  attacks when he first put the mask on. He admitted not to have used CPAP in over a year. I suggested he return for a sleep study. 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 He was taking Benadryl 50 mg each night. He had been doing this for years. He had a split-night sleep study on 01/24/2014 and went over his test results with him in detail today. Baseline sleep efficiency was 92.9% with a latency to sleep of 2.5 minutes and wake after sleep onset of 4 minutes with no significant fragmentation noted. He had a normal arousal index. He had an increased percentage of stage II sleep, absence of deep sleep and absence of REM  sleep. He had PACs and PVCs on EKG and no EEG changes. He had no significant PLMS. His total AHI was highly elevated at 45.2 events per hour. Baseline oxygen saturation was 93% with a nadir of 88%. He was titrated on CPAP during the rest of the test. Sleep efficiency was 90.6%. Arousal index was normal. He had an increased percentage of stage II sleep, absence of deep sleep and a normal percentage of REM sleep. Average oxygen saturation was 93% with a nadir of 88%. He had no significant periodic leg movements. He was titrated from 5-8 cm. He had some central apneas on the final pressure of 8 cm. Based on the overall test results I prescribed CPAP at a pressure of 8 cm using large nasal pillows.  I reviewed the patient's CPAP compliance data from 03/19/2014 to 04/17/2014, which is a total of 30 days, during which time the patient used CPAP every day. The average usage for all days was 6 hours and 24 minutes. The percent used days greater than 4 hours was 90 %, indicating excellent compliance. The residual AHI was 6 per hour, indicating a slightly suboptimal treatment pressure of 8 cwp with EPR of 3. Air leak from the mask was low at 7 L per minute at the 95th percentile. He may need an increase in his pressure, I felt.  His Past Medical History Is Significant For: Past Medical History  Diagnosis Date  . Hypertension   . Anxiety   . Depression   . Parkinson's disease NEUROLOGIST--  DR Jim Like    IN A STUDY AT [facility]--- lov note crae everywhere 02-19-2013 dr Kalman Shan scott  . Headache(784.0)   . Spinal stenosis of lumbar region   . Degenerative disc disease, cervical   . OSA (obstructive sleep apnea)     SEVERE OSA PER STUDY 2005--  USES NASAL CANNULA  WITH SETTING AT 12 -- NO MASK  . Cervical spondylosis   . Right ureteral stone   . OA (osteoarthritis)   . Wears glasses   . Urinary incontinence     SECONDARY TO PARKINSON'S DISEASE    His Past Surgical History Is Significant For: Past Surgical  History  Procedure Laterality Date  . Total knee arthroplasty Left 03/04/2013    Procedure: LEFT TOTAL KNEE ARTHROPLASTY;  Surgeon: Gearlean Alf, MD;  Location: [facility];  Service: Orthopedics;  Laterality: Left;  . Hip pinning,cannulated Right 04/13/2013    Procedure: CANNULATED HIP PINNING;  Surgeon: Johnn Hai, MD;  Location: [facility];  Service: Orthopedics;  Laterality: Right;  PERCUTANEOUS CANNULATED RIGHT HIP PINNING   . Lumbar laminectomy  01-05-2010    L2  --  L5  . Knee arthroscopy w/ debridement Bilateral RIGHT  04-07-2008/   LEFT 10-13-2005  . Cholecystectomy  2000  .  Cystoscopy with retrograde pyelogram, ureteroscopy and stent placement Right 11/13/2013    Procedure: CYSTOSCOPY WITH RIGHT URETEROSCOPY, Right Retrograde Pyelogram, RIGHT URETERAL STENT PLACEMENT, Basket Stone Extraction;  Surgeon: Ardis Hughs, MD;  Location: [facility];  Service: Urology;  Laterality: Right;  . Holmium laser application Right 12/16/84    Procedure: RIGHT LASER LITHOTRIPSY;  Surgeon: Ardis Hughs, MD;  Location: [facility];  Service: Urology;  Laterality: Right;    His Family History Is Significant For: Family History  Problem Relation Age of Onset  . Cancer Mother   . Parkinson's disease      H/O  . Alzheimer's disease      H/O  . ALS      H/O    His Social History Is Significant For: History   Social History  . Marital Status: Married    Spouse Name: [place]    Number of Children: 1  . Years of Education: Bachelor's   Occupational History  . RETIRED    Social History Main Topics  . Smoking status: Never Smoker   . Smokeless tobacco: Never Used  . Alcohol Use: No  . Drug Use: No  . Sexual Activity: None   Other Topics Concern  . None   Social History Narrative   Patient is married and lives at home with his wife ([place]), has 1 child   Patient is right handed   Education level is Bachelor's   Caffeine consumption is 3  cups daily    His Allergies Are:  No Known Allergies:   His Current Medications Are:  Outpatient Encounter Prescriptions as of 09/17/2014  Medication Sig  . aspirin EC 81 MG tablet Take 81 mg by mouth every morning.  . carbidopa-levodopa (SINEMET IR) 25-100 MG per tablet Take 1 tablet by mouth 4 (four) times daily. May take an additional onehalf tab in the morning when you wake up to decrease freezing episode  . lisinopril (PRINIVIL,ZESTRIL) 10 MG tablet 10 mg daily.   Marland Kitchen LORazepam (ATIVAN) 1 MG tablet Take 1 tablet (1 mg total) by mouth 4 (four) times daily as needed for anxiety.  Marland Kitchen OVER THE COUNTER MEDICATION 25 mg. Equate night time sleep aid, daily at night  . oxyCODONE-acetaminophen (PERCOCET) 10-325 MG per tablet   . phenazopyridine (PYRIDIUM) 200 MG tablet Take 1 tablet (200 mg total) by mouth 3 (three) times daily as needed for pain.  . pramipexole (MIRAPEX) 1 MG tablet Take 1 tablet (1 mg total) by mouth 3 (three) times daily.  . rasagiline (AZILECT) 1 MG TABS tablet Take 1 tablet (1 mg total) by mouth every morning.  . vitamin E 400 UNIT capsule Take by mouth 2 (two) times daily.  . [DISCONTINUED] lisinopril (PRINIVIL,ZESTRIL) 5 MG tablet Take 10 mg by mouth daily. Takes 13m daily  :  Review of Systems:  Out of a complete 14 point review of systems, all are reviewed and negative with the exception of these symptoms as listed below:        Review of Systems  Constitutional: Positive for fatigue.  HENT: Positive for drooling.   Eyes:       Blurred vision  Gastrointestinal: Positive for constipation.  Endocrine:       Excessive eating  Genitourinary:       Incontinence of bladder, frequency of urination  Musculoskeletal: Positive for back pain and gait problem.  Skin: Positive for rash.  Neurological: Positive for dizziness and numbness.  Freezing more ,getting up during the night because of bladder ,insomnia,daytime sleepiness    Objective:  Neurologic  Exam  Physical Exam Physical Examination:   Filed Vitals:   09/17/14 1004  BP: 123/69  Pulse: 74  Temp: 97.9 F (36.6 C)    General Examination: The patient is a very pleasant 64 y.o. male in no acute distress.  HEENT: Normocephalic, atraumatic, pupils are equal, round and reactive to light and accommodation. Funduscopic exam is normal with sharp disc margins noted. Extraocular tracking shows mild saccadic breakdown without nystagmus noted. There is limitation to upper gaze. There is mild decrease in eye blink rate. Hearing is intact. Face is symmetric with mild facial masking and normal facial sensation. There is no lip, neck or jaw tremor. Neck is mildly rigid with intact passive ROM. There are no carotid bruits on auscultation. Oropharynx exam reveals moderate mouth dryness. Airway is quite narrow. Uvula is large, tongue is small in size, tonsils are small, right-sided a little bigger than left. Mallampati is class III. Tongue protrudes centrally and palate elevates symmetrically. There is no drooling.   Chest: is clear to auscultation without wheezing, rhonchi or crackles noted.  Heart: sounds are regular and normal without murmurs, rubs or gallops noted.   Abdomen: is soft, non-tender and non-distended with normal bowel sounds appreciated on auscultation.  Extremities: There is trace edema in the distal lower extremities bilaterally. Pedal pulses are intact. There are no varicose veins.  Skin: is warm and dry with no trophic changes noted. Age-related changes are noted on the skin.   Musculoskeletal: exam reveals no obvious joint deformities, tenderness, joint swelling or erythema.  Neurologically:  Mental status: The patient is awake and alert, paying good  attention. He is able to completely provide the history. His wife provides details. He is oriented to: person, place, time/date, situation, day of week, month of year and year. His memory, attention, language and knowledge are  intact. There is no aphasia, agnosia, apraxia or anomia. There is a no significant degree of bradyphrenia. Speech is mildly hypophonic with no dysarthria noted. Mood is congruent and affect is normal.  Cranial nerves are as described above under HEENT exam. In addition, shoulder shrug is normal with equal shoulder height noted.  Motor exam: Normal bulk, and strength for age is noted. There are no dyskinesias noted. Tone is mildly rigid with presence of cogwheeling in the right upper extremity. There is overall mild bradykinesia. There is no drift or rebound.  There is an intermittent mild to moderate resting tremor, RUE more than LUE. Romberg is negative, but with mild sway.   Reflexes are 1+.  Fine motor skills exam: Finger taps are mild to moderately impaired on the right and mildly impaired on the left. Hand movements are mildly impaired on the right and mildly impaired on the left. RAP (rapid alternating patting) is mildly impaired on the right and mildly impaired on the left. Foot taps are moderately impaired on the right and mildly impaired on the left. Foot agility (in the form of heel stomping) is moderately impaired on the right and mildly impaired on the left.    Cerebellar testing shows no dysmetria or intention tremor on finger to nose testing.   Sensory exam is intact to light touch, PP, and temperature sense.   Gait, station and balance: He stands up from the seated position with moderate difficulty and does need to push up with His hands. He needs no assistance, but takes 3 attempts. No  veering to one side is noted. He is not noted to lean to the side. Posture is moderately stooped. Stance is wide-based. He walks with decrease in stride length and pace and decreased arm swing. He turns in 3 steps. Tandem walk is not possible. Balance is mildly impaired. He is not able to do a toe or heel stance.     Assessment and Plan:   In summary, James Schultz is a very pleasant 64 year old  right-handed gentleman with an underlying medical history of Parkinson's disease diagnosed in 2007, depression, anxiety, lumbar spinal stenosis, cervical degenerative disc disease, kidney stone, osteoarthritis, s/p L TKA and s/p R hip surgery, and obesity, who presents for followup consultation of his obstructive sleep apnea and right-sided predominant Parkinson's disease. We talked about his sleep apnea treatment and his compliance data in detail today. He is encouraged to be more consistent with his CPAP usage. He is commended on trying to use it every night. Motor-wise he appears to be fairly stable. We will revisit his medication regimen next time and at this point will keep his current medication regimen the same. He does have Sinemet usually 4 times a day and for the second dose he also takes an additional half pill. I renewed all his prescriptions for 90 day supplies. I also talked to him today about DBS as a treatment option for Parkinson's disease. I explained how DBS works. He has previously had a consultation for this but at that time was not deemed a candidate because he had mild Parkinson's symptoms. We will revisit this at least on a yearly basis and this may be a very useful in valid treatment option for him in the future. I will see him back routinely in 4 months, sooner if needed. I encouraged him to call with any interim questions or concerns or refill requests. I answered all his questions today and the patient and his wife were in agreement.  I spent 40 min in total face-to-face time with the patient, more 50% of which was spent in counseling and coordination of care, reviewing test results, reviewing medication and reviewing the diagnosis of PD and OSA, its prognosis and treatment options.

## 2014-09-17 NOTE — Patient Instructions (Addendum)
I think your Parkinson's disease has remained fairly stable, which is reassuring. Nevertheless, as you know, this disease does progress with time. It can affect your balance, your memory, your mood, your bowel and bladder function, your posture, balance and walking. Overall you are doing fairly well but I do want to suggest a few things today:  Remember to drink plenty of fluid, eat healthy meals and do not skip any meals. Try to eat protein with a every meal and eat a healthy snack such as fruit or nuts in between meals. Try to keep a regular sleep-wake schedule and try to exercise daily, particularly in the form of walking, 20-30 minutes a day, if you can.   Taking your medication on schedule is key.   Try to stay active physically and mentally. Engage in social activities in your community and with your family and try to keep up with current events by reading the newspaper or watching the news. Try to do word puzzles and you may like to do word puzzles and brain games on the computer such as on [email or web address]   As far as your medications are concerned, I would like to suggest that you take your current medication with the following additional changes: no changes as yet.     I would like to see you back in 4 months, sooner if we need to. Please call us with any interim questions, concerns, problems, updates or refill requests.  Our phone number is [phone]. We also have an after hours call service for urgent matters and there is a physician on-call for urgent questions, that cannot wait till the next work day. For any emergencies you know to call 911 or go to the nearest emergency room.     Please continue using your CPAP regularly. While your insurance requires that you use CPAP at least 4 hours each night on 70% of the nights, I recommend, that you not skip any nights and use it throughout the night if you can. Getting used to CPAP and staying with the treatment long term does take time and  patience and discipline. Untreated obstructive sleep apnea when it is moderate to severe can have an adverse impact on cardiovascular health and raise her risk for heart disease, arrhythmias, hypertension, congestive heart failure, stroke and diabetes. Untreated obstructive sleep apnea causes sleep disruption, nonrestorative sleep, and sleep deprivation. This can have an impact on your day to day functioning and cause daytime sleepiness and impairment of cognitive function, memory loss, mood disturbance, and problems focussing. Using CPAP regularly can improve these symptoms.

## 2014-10-15 ENCOUNTER — Telehealth: Payer: Self-pay | Admitting: Neurology

## 2014-10-15 NOTE — Telephone Encounter (Signed)
Called and left VM message for return call back for new appointment time change at 9:30 on 10/31/14.

## 2014-10-31 ENCOUNTER — Ambulatory Visit: Payer: Medicare Other | Admitting: Neurology

## 2015-01-28 ENCOUNTER — Other Ambulatory Visit: Payer: Self-pay | Admitting: Orthopedic Surgery

## 2015-01-30 ENCOUNTER — Ambulatory Visit (INDEPENDENT_AMBULATORY_CARE_PROVIDER_SITE_OTHER): Payer: Medicare Other | Admitting: Neurology

## 2015-01-30 ENCOUNTER — Encounter: Payer: Self-pay | Admitting: Neurology

## 2015-01-30 VITALS — BP 130/82 | HR 74 | Ht 70.0 in | Wt 256.0 lb

## 2015-01-30 DIAGNOSIS — G4733 Obstructive sleep apnea (adult) (pediatric): Secondary | ICD-10-CM

## 2015-01-30 DIAGNOSIS — G20 Parkinson's disease: Secondary | ICD-10-CM

## 2015-01-30 DIAGNOSIS — W19XXXS Unspecified fall, sequela: Secondary | ICD-10-CM

## 2015-01-30 DIAGNOSIS — Z9989 Dependence on other enabling machines and devices: Secondary | ICD-10-CM

## 2015-01-30 DIAGNOSIS — M79672 Pain in left foot: Secondary | ICD-10-CM

## 2015-01-30 MED ORDER — CARBIDOPA-LEVODOPA ER 50-200 MG PO TBCR: 1.0000 | EXTENDED_RELEASE_TABLET | Freq: Every day | ORAL | Status: AC

## 2015-01-30 NOTE — Patient Instructions (Signed)
I think your Parkinson's disease has slightly progressed some. Nevertheless, as you know, this disease does progress with time. It can affect your balance, your memory, your mood, your bowel and bladder function, your posture, balance and walking. Overall you are doing fairly well but I do want to suggest a few things today:  Remember to drink plenty of fluid, eat healthy meals and do not skip any meals. Try to eat protein with a every meal and eat a healthy snack such as fruit or nuts in between meals. Try to keep a regular sleep-wake schedule and try to exercise daily, particularly in the form of walking, 20-30 minutes a day, if you can.   Taking your medication on schedule is key.   Try to stay active physically and mentally. Engage in social activities in your community and with your family and try to keep up with current events by reading the newspaper or watching the news. Try to do word puzzles and you may like to do word puzzles and brain games on the computer such as on [email or web address]   As far as your medications are concerned, I would like to suggest that you take your current medication with the following additional changes:  1. Take Sinemet 25/100 mg: 1 pill 4 times a day, at 6, 10, 2 PM and 6 PM.  2. Take Sinemet CR 50/200 mg: 1 pill each bedtime, around 9 PM.  3. Keep mirapex 1 mg: 1 pill 3 times a day, 6, 10, and 2 PM.  4. Azilect 1 mg: take 1 in AM daily, at 6 AM.   Please continue using your CPAP regularly. While your insurance requires that you use CPAP at least 4 hours each night on 70% of the nights, I recommend, that you not skip any nights and use it throughout the night if you can. Getting used to CPAP and staying with the treatment long term does take time and patience and discipline. Untreated obstructive sleep apnea when it is moderate to severe can have an adverse impact on cardiovascular health and raise her risk for heart disease, arrhythmias, hypertension, congestive heart  failure, stroke and diabetes. Untreated obstructive sleep apnea causes sleep disruption, nonrestorative sleep, and sleep deprivation. This can have an impact on your day to day functioning and cause daytime sleepiness and impairment of cognitive function, memory loss, mood disturbance, and problems focussing. Using CPAP regularly can improve these symptoms.  I would like to see you back in 4 months, sooner if we need to. Please call us with any interim questions, concerns, problems, updates or refill requests.  Our phone number is [phone]. We also have an after hours call service for urgent matters and there is a physician on-call for urgent questions, that cannot wait till the next work day. For any emergencies you know to call 911 or go to the nearest emergency room.

## 2015-01-30 NOTE — Progress Notes (Signed)
Subjective:    Patient ID: James Schultz is a 64 y.o. male.  HPI     Interim history:   James Schultz is a 64 year old right-handed gentleman with an underlying medical history of Parkinson's disease diagnosed in 2007, depression, anxiety, lumbar spinal stenosis, cervical degenerative disc disease, kidney stone, osteoarthritis, s/p L TKA and s/p R hip surgery, who presents for followup consultation of his PD and obstructive sleep apnea. He is accompanied by his wife again today. I last saw him on 09/17/14, at which time he reported feeling well with CPAP, but had sleep disturbance due to nocturia and urinary incontinence at night. He did endorse sleeping better with CPAP. His Parkinson's for the most part was stable but he had more freezing and more tremors. I did not change his medications for Parkinson's disease. I asked him to continue using CPAP regularly.  Today, 01/30/2015: I reviewed his CPAP compliance data from 12/24/2014 through 01/22/2015 which is a total of 30 days during which time he used his machine 28 days with percent used days greater than 4 hours at 70%, indicating adequate compliance with an average usage for all days of 5 hours and 3 minutes, residual AHI at 4 per hour, leak acceptable with the 95th percentile at 11.5 L/m on a pressure of 9 cm with EPR of 3.  Today, 01/30/2015: He reports Left knee pain and ongoing low back pain. He takes Percocet half a pill up to 3 times a day. He sees Dr. Glenna Fellows for his back pain. He had back surgery under Dr. Carloyn Manner a few years ago. He had total knee replacement on the left in July 2014 under Dr. Maureen Ralphs, in August 2014 he required hip surgery secondary to hip fracture. He is in the process of establishing care with a new memory care physician. His wife provides additional information. She feels that his memory is worse. Unfortunately, he fell at church recently, about 2 weeks ago. He fell into the wall. He was trying to hold onto the railing  while walking the steps but missed a step or misjudged 1. Of note, he was also wearing his bifocals at the time. He has left foot pain and will require foot surgery which is scheduled for 02/19/2015. He is compliant with CPAP treatment but does admit that sometimes he falls asleep without it.he saw Dr. Nicki Reaper at [facility] in March 2016 and a Sinemet CR 25-100 milligrams strength was added for his fourth dose. He was taking Sinemet 3 times a day, at 6, 10, and 2 PM. He was taking the C arm around 6 PM. He does not feel it has made much of a difference. Some time ago, he talked with Dr. Nicki Reaper about DBS surgery and was advised against it at the time.  Previously:  I saw him on 05/02/2014, at which time he reported that he initially did not know how to use the CPAP machine but after he changed to another DME provider and received another machine, he was using it well. He felt improved with his sleep. He was compliant with treatment. He reported chronic back pain and right hip pain. He also reported foot pain and some depressive symptoms. He was tried on Wellbutrin in the past but felt worse.    He used to see Dr. Jim Like for his Parkinson's disease and was last seen by him on 01/03/2014, at which time he was taking Sinemet 4 times a day, starting at 7 AM every 4 hours. He was  also on Mirapex 1 mg strength one pill 3 times a day and Azilect 1 mg once daily. He had mild wearing off symptoms of motor fluctuations. He had developed kidney stones which were treated in April 2015. He was first seen by Dr. Janann Colonel and January 2015 and prior to that he used to see Dr. Morene Antu and was last seen by him in March 2013 at which time they discussed potential DBS treatment. He was instructed to follow-up with Dr. Nicki Reaper at [facility] for further evaluation. He had participated in a Parkinson's disease study at [facility] for 5 years which she finished in 2014.   I reviewed his compliance data from 08/16/2014 through 09/14/2014 which is a  total of 30 days during which time he used his CPAP every day except for 2 days with percent used days greater than 4 hours of only 50%, indicating suboptimal compliance with an average usage of 3 hours and 41 minutes, residual AHI acceptable at 4.9 per hour and leak acceptable with the 95th percentile at 12.5 L/m. Pressure at 9 cm with EPR of 3.  I first met him on 01/20/2014 at the request of Dr. Janann Colonel, at which time the patient reported that he was diagnosed with severe obstructive sleep apnea some 10 years ago. He reported being on CPAP but having difficulty tolerating CPAP. He reported cognitive decline as well as daytime somnolence and admitted to not be fully compliant with CPAP therapy. He reported panic attacks when he first put the mask on. He admitted not to have used CPAP in over a year. I suggested he return for a sleep study. 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 He was taking Benadryl 50 mg each night. He had been doing this for years. He had a split-night sleep study on 01/24/2014 and went over his test results with him in detail today. Baseline sleep efficiency was 92.9% with a latency to sleep of 2.5 minutes and wake after sleep onset of 4 minutes with no significant fragmentation noted. He had a normal arousal index. He had an increased percentage of stage II sleep, absence of deep sleep and absence of REM sleep. He had PACs and PVCs on EKG and no EEG changes. He had no significant PLMS. His total AHI was highly elevated at 45.2 events per hour. Baseline oxygen saturation was 93% with a nadir of 88%. He was titrated on CPAP during the rest of the test. Sleep efficiency was 90.6%. Arousal index was normal. He had an increased percentage of stage II sleep, absence of deep sleep and a normal percentage of REM sleep. Average oxygen saturation was 93% with a nadir of 88%. He had no significant periodic leg movements. He was titrated from 5-8 cm. He had some central apneas on the  final pressure of 8 cm. Based on the overall test results I prescribed CPAP at a pressure of 8 cm using large nasal pillows.  I reviewed the patient's CPAP compliance data from 03/19/2014 to 04/17/2014, which is a total of 30 days, during which time the patient used CPAP every day. The average usage for all days was 6 hours and 24 minutes. The percent used days greater than 4 hours was 90 %, indicating excellent compliance. The residual AHI was 6 per hour, indicating a slightly suboptimal treatment pressure of 8 cwp with EPR of 3. Air leak from the mask was low at 7 L per minute at the 95th percentile. He may need an increase in his pressure,  I felt.   His Past Medical History Is Significant For: Past Medical History  Diagnosis Date  . Hypertension   . Anxiety   . Depression   . Parkinson's disease NEUROLOGIST--  DR Jim Like    IN A STUDY AT [facility]--- lov note crae everywhere 02-19-2013 dr Kalman Shan scott  . Headache(784.0)   . Spinal stenosis of lumbar region   . Degenerative disc disease, cervical   . OSA (obstructive sleep apnea)     SEVERE OSA PER STUDY 2005--  USES NASAL CANNULA  WITH SETTING AT 12 -- NO MASK  . Cervical spondylosis   . Right ureteral stone   . OA (osteoarthritis)   . Wears glasses   . Urinary incontinence     SECONDARY TO PARKINSON'S DISEASE    His Past Surgical History Is Significant For: Past Surgical History  Procedure Laterality Date  . Total knee arthroplasty Left 03/04/2013    Procedure: LEFT TOTAL KNEE ARTHROPLASTY;  Surgeon: Gearlean Alf, MD;  Location: [facility];  Service: Orthopedics;  Laterality: Left;  . Hip pinning,cannulated Right 04/13/2013    Procedure: CANNULATED HIP PINNING;  Surgeon: Johnn Hai, MD;  Location: [facility];  Service: Orthopedics;  Laterality: Right;  PERCUTANEOUS CANNULATED RIGHT HIP PINNING   . Lumbar laminectomy  01-05-2010    L2  --  L5  . Knee arthroscopy w/ debridement Bilateral RIGHT  04-07-2008/   LEFT 10-13-2005  .  Cholecystectomy  2000  . Cystoscopy with retrograde pyelogram, ureteroscopy and stent placement Right 11/13/2013    Procedure: CYSTOSCOPY WITH RIGHT URETEROSCOPY, Right Retrograde Pyelogram, RIGHT URETERAL STENT PLACEMENT, Basket Stone Extraction;  Surgeon: Ardis Hughs, MD;  Location: [facility];  Service: Urology;  Laterality: Right;  . Holmium laser application Right 03/15/174    Procedure: RIGHT LASER LITHOTRIPSY;  Surgeon: Ardis Hughs, MD;  Location: [facility];  Service: Urology;  Laterality: Right;    His Family History Is Significant For: Family History  Problem Relation Age of Onset  . Cancer Mother   . Parkinson's disease      H/O  . Alzheimer's disease      H/O  . ALS      H/O    His Social History Is Significant For: History   Social History  . Marital Status: Married    Spouse Name: [place]  . Number of Children: 1  . Years of Education: Bachelor's   Occupational History  . RETIRED    Social History Main Topics  . Smoking status: Never Smoker   . Smokeless tobacco: Never Used  . Alcohol Use: No  . Drug Use: No  . Sexual Activity: Not on file   Other Topics Concern  . None   Social History Narrative   Patient is married and lives at home with his wife ([place]), has 1 child   Patient is right handed   Education level is Bachelor's   Caffeine consumption is 3 cups daily    His Allergies Are:  No Known Allergies:   His Current Medications Are:  Outpatient Encounter Prescriptions as of 01/30/2015  Medication Sig  . aspirin EC 81 MG tablet Take 81 mg by mouth every morning.  . carbidopa-levodopa (SINEMET IR) 25-100 MG per tablet Take 1 tablet by mouth 4 (four) times daily. May take an additional onehalf tab in the morning when you wake up to decrease freezing episode  . lisinopril (PRINIVIL,ZESTRIL) 10 MG tablet 10 mg daily.   Marland Kitchen  LORazepam (ATIVAN) 1 MG tablet Take 1 tablet (1 mg total) by mouth 4 (four) times  daily as needed for anxiety.  Marland Kitchen OVER THE COUNTER MEDICATION 25 mg. Equate night time sleep aid, daily at night  . oxyCODONE-acetaminophen (PERCOCET) 10-325 MG per tablet   . phenazopyridine (PYRIDIUM) 200 MG tablet Take 1 tablet (200 mg total) by mouth 3 (three) times daily as needed for pain.  . pramipexole (MIRAPEX) 1 MG tablet Take 1 tablet (1 mg total) by mouth 3 (three) times daily.  . rasagiline (AZILECT) 1 MG TABS tablet Take 1 tablet (1 mg total) by mouth every morning.  . vitamin E 400 UNIT capsule Take by mouth 2 (two) times daily.   No facility-administered encounter medications on file as of 01/30/2015.  :  Review of Systems:  Out of a complete 14 point review of systems, all are reviewed and negative with the exception of these symptoms as listed below:   Review of Systems  Neurological: Positive for dizziness and light-headedness.       Parkinson's  OSA Cpap  Psychiatric/Behavioral: Positive for sleep disturbance.    Objective:  Neurologic Exam  Physical Exam Physical Examination:   Filed Vitals:   01/30/15 1125  BP: 130/82  Pulse: 74   General Examination: The patient is a very pleasant 64 y.o. male in no acute distress.  HEENT: Normocephalic, atraumatic, pupils are equal, round and reactive to light and accommodation. Funduscopic exam is normal with sharp disc margins noted. Extraocular tracking shows mild saccadic breakdown without nystagmus noted. There is limitation to upper gaze. There is mild decrease in eye blink rate. Hearing is intact. Face is symmetric with mild facial masking and normal facial sensation. There is no lip, neck or jaw tremor. Neck is mildly rigid with intact passive ROM. There are no carotid bruits on auscultation. Oropharynx exam reveals moderate mouth dryness. Airway is quite narrow. Uvula is large, tongue is small in size, tonsils are small, right-sided a little bigger than left. Mallampati is class III. Tongue protrudes centrally and  palate elevates symmetrically. There is no drooling.   Chest: is clear to auscultation without wheezing, rhonchi or crackles noted.  Heart: sounds are regular and normal without murmurs, rubs or gallops noted.   Abdomen: is soft, non-tender and non-distended with normal bowel sounds appreciated on auscultation.  Extremities: There is trace edema in the distal lower extremities bilaterally. Pedal pulses are intact. There are no varicose veins.  Skin: is warm and dry with no trophic changes noted. Age-related changes are noted on the skin.   Musculoskeletal: exam reveals no obvious joint deformities, tenderness, joint swelling or erythema.  Neurologically:  Mental status: The patient is awake and alert, paying good  attention. He is able to completely provide the history. His wife provides details. He is oriented to: person, place, time/date, situation, day of week, month of year and year. His memory, attention, language and knowledge are fairly well preserved.  On 01/30/2015: MMSE: 28/30, CDT: 3/4, AFT: 11/min.   There is no aphasia, agnosia, apraxia or anomia. There is a no significant degree of bradyphrenia. Speech is mildly to moderately hypophonic with no dysarthria noted. Mood is congruent and affect is normal.  Cranial nerves are as described above under HEENT exam. In addition, shoulder shrug is normal with equal shoulder height noted.  Motor exam: Normal bulk, and strength for age is noted. There are no dyskinesias noted. Tone is mildly rigid with presence of cogwheeling in the right upper extremity.  There is overall mild bradykinesia. There is no drift or rebound.  There is an intermittent mild to moderate resting tremor, RUE more than LUE. Romberg is negative, but with mild sway.   Reflexes are 1+.  Fine motor skills exam: findings motor skills are mild to moderately impaired, slightly worse than last time and worse on the right.  Cerebellar testing shows no dysmetria or intention  tremor on finger to nose testing.   Sensory exam is intact to light touch.    Gait, station and balance: He stands up from the seated position with moderate difficulty and does need to push up with His hands. He needs no assistance, but takes about 3 attempts. No veering to one side is noted. He is not noted to lean to the side. Posture is moderately stooped. Stance is wide-based. He walks with decrease in stride length and pace and decreased arm swing. He turns in 3 steps. Tandem walk is not possible. Balance is mildly impaired. He is not able to do a toe or heel stance. He uses a three-pronged cane.    Assessment and Plan:   In summary, James Schultz is a very pleasant 64 year old right-handed gentleman with an underlying medical history of Parkinson's disease diagnosed in 2007, depression, anxiety, lumbar spinal stenosis, cervical degenerative disc disease, kidney stone, osteoarthritis, s/p L TKA and s/p R hip surgery, and obesity, who presents for followup consultation of his obstructive sleep apnea and right-sided predominant Parkinson's disease. Generally speaking, he has been compliant with CPAP therapy. I did encourage him to be more consistent with the CPAP usage. Parkinson's-wise he seems to be a little bit worse. I suggested that he continue with Mirapex 1 mg 3 times a day at 6 AM, 10 AM and 2 PM. He is advised to continue with Azilect 1 mg at 6 AM daily. Furthermore, I would like for him to take Sinemet regular release 25-100 milligrams strength one pill 4 times a day, at 6 AM, 10 AM, 2 PM and 6 PM. In addition, I suggested we change his Sinemet CR to 50-200 milligrams strength, 1 pill each bedtime, around 9 or 9:30 PM. I provided him with a new prescription for Sinemet CR. I provided him with written instructions. Furthermore, I encouraged him to discuss potential DBS treatment with Dr. Nicki Reaper next time he sees them. He has foot surgery planned for 02/19/2015. We talked about exercise quite a  bit today. At times he spends 2 hours at the gym using the treadmill and doing weight lifting. I've asked him to reduce the heavy weight lifting and try to avoid the treadmill which sometimes is not safe for Parkinson's patients. I would like for him to do light weights and not do more than 45-60 minutes of exercise at a time. He is encouraged to use his stationary bike.I would like to see him back in 4 months, sooner if needed. I encouraged him to call for any interim questions or concerns. I answered all her questions today and they were in agreement. I spent 30 min in total face-to-face time with the patient, more 50% of which was spent in counseling and coordination of care, reviewing test results, reviewing medication and reviewing the diagnosis of PD and OSA, the prognosis and treatment options.

## 2015-02-19 ENCOUNTER — Ambulatory Visit (HOSPITAL_BASED_OUTPATIENT_CLINIC_OR_DEPARTMENT_OTHER): Admit: 2015-02-19 | Payer: Self-pay | Admitting: Orthopedic Surgery

## 2015-02-19 ENCOUNTER — Encounter (HOSPITAL_BASED_OUTPATIENT_CLINIC_OR_DEPARTMENT_OTHER): Payer: Self-pay

## 2015-02-19 SURGERY — FUSION, JOINT, GREAT TOE
Anesthesia: General | Laterality: Left

## 2015-03-06 ENCOUNTER — Telehealth: Payer: Self-pay | Admitting: Neurology

## 2015-03-06 NOTE — Telephone Encounter (Signed)
Patients wife called and requested to speak with the nurse regarding some changes in medication the patient has made. Please call and advise.

## 2015-03-06 NOTE — Telephone Encounter (Signed)
For sleep, he can try Melatonin at night: take 3 to 5 mg one to 2 hours before bedtime. He may go up to 10 mg if needed. As far as the sinemet, and sinemet CR, I think the regimen we discussed during our last visit is the same. Please remind patient to continue to use CPAP through the night, he seems to not keep it on all night.

## 2015-03-06 NOTE — Telephone Encounter (Signed)
I spoke to wife. She states that patient fell about 6 weeks ago at church and since then has had trouble with decreased mobility after 7-8pm and early in the morning. They recently saw PCP's PA and she wanted the patient to check with you about a medication change. She suggested that the patient take Sinemet 25-100 IR at 6, 10, 2, and 6pm; then take the Sinemet 50-200 at 9pm. Is that ok with you? Patient was also diagnosed with orthostatic hypotension.   Wife had a second question. She reports that patient has trouble sleeping at night, usually wakes at 1am even with CPAP treatment. (I pulled his download if you need to see it) Wife states that this has been going on for a long time. She gave him one of her Ambien (she reports knowing that she shouldn't do that) and he slept for 5 hours straight. Wife would like to know if you would prescribe a sleep aid for him?

## 2015-03-06 NOTE — Telephone Encounter (Signed)
I left message on wife's vm of below recommendation. Left call back number for any further questions.

## 2015-06-01 ENCOUNTER — Ambulatory Visit: Payer: Medicare Other | Admitting: Neurology

## 2016-02-08 DIAGNOSIS — B9620 Unspecified Escherichia coli [E. coli] as the cause of diseases classified elsewhere: Secondary | ICD-10-CM | POA: Diagnosis not present

## 2016-02-08 DIAGNOSIS — N390 Urinary tract infection, site not specified: Secondary | ICD-10-CM | POA: Diagnosis not present

## 2016-02-08 DIAGNOSIS — R7881 Bacteremia: Secondary | ICD-10-CM | POA: Diagnosis not present

## 2016-02-08 DIAGNOSIS — N3000 Acute cystitis without hematuria: Secondary | ICD-10-CM | POA: Diagnosis not present

## 2016-02-09 DIAGNOSIS — N390 Urinary tract infection, site not specified: Secondary | ICD-10-CM | POA: Diagnosis not present

## 2016-02-09 DIAGNOSIS — N3000 Acute cystitis without hematuria: Secondary | ICD-10-CM | POA: Diagnosis not present

## 2016-02-09 DIAGNOSIS — B9620 Unspecified Escherichia coli [E. coli] as the cause of diseases classified elsewhere: Secondary | ICD-10-CM | POA: Diagnosis not present

## 2016-02-09 DIAGNOSIS — R7881 Bacteremia: Secondary | ICD-10-CM | POA: Diagnosis not present

## 2016-02-10 DIAGNOSIS — N3000 Acute cystitis without hematuria: Secondary | ICD-10-CM | POA: Diagnosis not present

## 2016-02-10 DIAGNOSIS — N390 Urinary tract infection, site not specified: Secondary | ICD-10-CM | POA: Diagnosis not present

## 2016-02-10 DIAGNOSIS — R7881 Bacteremia: Secondary | ICD-10-CM | POA: Diagnosis not present

## 2016-02-10 DIAGNOSIS — B9620 Unspecified Escherichia coli [E. coli] as the cause of diseases classified elsewhere: Secondary | ICD-10-CM | POA: Diagnosis not present

## 2016-02-11 DIAGNOSIS — B9620 Unspecified Escherichia coli [E. coli] as the cause of diseases classified elsewhere: Secondary | ICD-10-CM | POA: Diagnosis not present

## 2016-02-11 DIAGNOSIS — R7881 Bacteremia: Secondary | ICD-10-CM | POA: Diagnosis not present

## 2016-02-11 DIAGNOSIS — N3000 Acute cystitis without hematuria: Secondary | ICD-10-CM | POA: Diagnosis not present

## 2016-02-11 DIAGNOSIS — N390 Urinary tract infection, site not specified: Secondary | ICD-10-CM | POA: Diagnosis not present

## 2016-03-18 DIAGNOSIS — Z1211 Encounter for screening for malignant neoplasm of colon: Secondary | ICD-10-CM | POA: Diagnosis not present

## 2016-03-18 DIAGNOSIS — Z7189 Other specified counseling: Secondary | ICD-10-CM | POA: Diagnosis not present

## 2016-03-18 DIAGNOSIS — Z1389 Encounter for screening for other disorder: Secondary | ICD-10-CM | POA: Diagnosis not present

## 2016-03-18 DIAGNOSIS — Z299 Encounter for prophylactic measures, unspecified: Secondary | ICD-10-CM | POA: Diagnosis not present

## 2016-03-18 DIAGNOSIS — Z0000 Encounter for general adult medical examination without abnormal findings: Secondary | ICD-10-CM | POA: Diagnosis not present

## 2016-03-18 DIAGNOSIS — Z6836 Body mass index (BMI) 36.0-36.9, adult: Secondary | ICD-10-CM | POA: Diagnosis not present

## 2016-03-23 DIAGNOSIS — Z125 Encounter for screening for malignant neoplasm of prostate: Secondary | ICD-10-CM | POA: Diagnosis not present

## 2016-03-23 DIAGNOSIS — Z79899 Other long term (current) drug therapy: Secondary | ICD-10-CM | POA: Diagnosis not present

## 2016-03-23 DIAGNOSIS — R5383 Other fatigue: Secondary | ICD-10-CM | POA: Diagnosis not present

## 2016-03-23 DIAGNOSIS — E7800 Pure hypercholesterolemia, unspecified: Secondary | ICD-10-CM | POA: Diagnosis not present

## 2016-04-08 ENCOUNTER — Encounter (HOSPITAL_COMMUNITY): Payer: Self-pay

## 2016-04-08 ENCOUNTER — Inpatient Hospital Stay (HOSPITAL_COMMUNITY)
Admission: EM | Admit: 2016-04-08 | Discharge: 2016-04-11 | DRG: 066 | Payer: Medicare Other | Attending: Internal Medicine | Admitting: Internal Medicine

## 2016-04-08 ENCOUNTER — Emergency Department (HOSPITAL_COMMUNITY): Payer: Medicare Other

## 2016-04-08 DIAGNOSIS — I119 Hypertensive heart disease without heart failure: Secondary | ICD-10-CM | POA: Diagnosis present

## 2016-04-08 DIAGNOSIS — M1612 Unilateral primary osteoarthritis, left hip: Secondary | ICD-10-CM | POA: Diagnosis not present

## 2016-04-08 DIAGNOSIS — Z9049 Acquired absence of other specified parts of digestive tract: Secondary | ICD-10-CM

## 2016-04-08 DIAGNOSIS — G20 Parkinson's disease: Secondary | ICD-10-CM | POA: Diagnosis not present

## 2016-04-08 DIAGNOSIS — G459 Transient cerebral ischemic attack, unspecified: Secondary | ICD-10-CM | POA: Diagnosis not present

## 2016-04-08 DIAGNOSIS — E785 Hyperlipidemia, unspecified: Secondary | ICD-10-CM | POA: Diagnosis present

## 2016-04-08 DIAGNOSIS — F32A Depression, unspecified: Secondary | ICD-10-CM | POA: Diagnosis present

## 2016-04-08 DIAGNOSIS — R739 Hyperglycemia, unspecified: Secondary | ICD-10-CM | POA: Diagnosis not present

## 2016-04-08 DIAGNOSIS — R29810 Facial weakness: Secondary | ICD-10-CM | POA: Diagnosis not present

## 2016-04-08 DIAGNOSIS — R55 Syncope and collapse: Secondary | ICD-10-CM

## 2016-04-08 DIAGNOSIS — I10 Essential (primary) hypertension: Secondary | ICD-10-CM | POA: Diagnosis present

## 2016-04-08 DIAGNOSIS — G4733 Obstructive sleep apnea (adult) (pediatric): Secondary | ICD-10-CM | POA: Diagnosis present

## 2016-04-08 DIAGNOSIS — Z96652 Presence of left artificial knee joint: Secondary | ICD-10-CM | POA: Diagnosis present

## 2016-04-08 DIAGNOSIS — I639 Cerebral infarction, unspecified: Secondary | ICD-10-CM

## 2016-04-08 DIAGNOSIS — N400 Enlarged prostate without lower urinary tract symptoms: Secondary | ICD-10-CM

## 2016-04-08 DIAGNOSIS — Z820 Family history of epilepsy and other diseases of the nervous system: Secondary | ICD-10-CM

## 2016-04-08 DIAGNOSIS — Z79899 Other long term (current) drug therapy: Secondary | ICD-10-CM

## 2016-04-08 DIAGNOSIS — Z7982 Long term (current) use of aspirin: Secondary | ICD-10-CM

## 2016-04-08 DIAGNOSIS — M47812 Spondylosis without myelopathy or radiculopathy, cervical region: Secondary | ICD-10-CM

## 2016-04-08 DIAGNOSIS — F419 Anxiety disorder, unspecified: Secondary | ICD-10-CM | POA: Diagnosis present

## 2016-04-08 DIAGNOSIS — F329 Major depressive disorder, single episode, unspecified: Secondary | ICD-10-CM | POA: Diagnosis present

## 2016-04-08 LAB — PROTIME-INR
INR: 1.08
PROTHROMBIN TIME: 14 s (ref 11.4–15.2)

## 2016-04-08 LAB — DIFFERENTIAL
Basophils Absolute: 0 10*3/uL (ref 0.0–0.1)
Basophils Relative: 0 %
EOS PCT: 3 %
Eosinophils Absolute: 0.3 10*3/uL (ref 0.0–0.7)
LYMPHS PCT: 28 %
Lymphs Abs: 2.5 10*3/uL (ref 0.7–4.0)
MONO ABS: 0.8 10*3/uL (ref 0.1–1.0)
Monocytes Relative: 9 %
Neutro Abs: 5.2 10*3/uL (ref 1.7–7.7)
Neutrophils Relative %: 60 %

## 2016-04-08 LAB — CBC
HCT: 41.8 % (ref 39.0–52.0)
Hemoglobin: 13.7 g/dL (ref 13.0–17.0)
MCH: 28.3 pg (ref 26.0–34.0)
MCHC: 32.8 g/dL (ref 30.0–36.0)
MCV: 86.4 fL (ref 78.0–100.0)
PLATELETS: 172 10*3/uL (ref 150–400)
RBC: 4.84 MIL/uL (ref 4.22–5.81)
RDW: 13.8 % (ref 11.5–15.5)
WBC: 8.7 10*3/uL (ref 4.0–10.5)

## 2016-04-08 LAB — COMPREHENSIVE METABOLIC PANEL
ALK PHOS: 55 U/L (ref 38–126)
ALT: 5 U/L — ABNORMAL LOW (ref 17–63)
ANION GAP: 10 (ref 5–15)
AST: 23 U/L (ref 15–41)
Albumin: 4 g/dL (ref 3.5–5.0)
BILIRUBIN TOTAL: 0.8 mg/dL (ref 0.3–1.2)
BUN: 15 mg/dL (ref 6–20)
CHLORIDE: 106 mmol/L (ref 101–111)
CO2: 25 mmol/L (ref 22–32)
CREATININE: 1.03 mg/dL (ref 0.61–1.24)
Calcium: 9.6 mg/dL (ref 8.9–10.3)
GLUCOSE: 118 mg/dL — AB (ref 65–99)
Potassium: 3.8 mmol/L (ref 3.5–5.1)
SODIUM: 141 mmol/L (ref 135–145)
Total Protein: 6.8 g/dL (ref 6.5–8.1)

## 2016-04-08 LAB — APTT: aPTT: 21 seconds — ABNORMAL LOW (ref 24–36)

## 2016-04-08 LAB — I-STAT CHEM 8, ED
BUN: 19 mg/dL (ref 6–20)
CALCIUM ION: 1.05 mmol/L — AB (ref 1.12–1.23)
Chloride: 106 mmol/L (ref 101–111)
Creatinine, Ser: 0.9 mg/dL (ref 0.61–1.24)
Glucose, Bld: 119 mg/dL — ABNORMAL HIGH (ref 65–99)
HCT: 41 % (ref 39.0–52.0)
Hemoglobin: 13.9 g/dL (ref 13.0–17.0)
Potassium: 3.7 mmol/L (ref 3.5–5.1)
SODIUM: 143 mmol/L (ref 135–145)
TCO2: 23 mmol/L (ref 0–100)

## 2016-04-08 LAB — ETHANOL

## 2016-04-08 LAB — I-STAT TROPONIN, ED: Troponin i, poc: 0.01 ng/mL (ref 0.00–0.08)

## 2016-04-08 LAB — CBG MONITORING, ED: Glucose-Capillary: 126 mg/dL — ABNORMAL HIGH (ref 65–99)

## 2016-04-08 NOTE — ED Notes (Signed)
Family at bedside. 

## 2016-04-08 NOTE — ED Notes (Signed)
MD Schlossman asked about plan of care for pt.

## 2016-04-08 NOTE — ED Notes (Signed)
Code Stroke paged came across @ 1720.

## 2016-04-08 NOTE — ED Notes (Signed)
NIH 0. Alert and oriented x4.Code stroke was canceled when pt returned from CT as all symptoms had resolved. Passed swallow screen. Wife at bedside.

## 2016-04-08 NOTE — ED Triage Notes (Signed)
Pt. Was sitting in the car while a family member went into [employer].  When she came back out. Pt. Was drooling to the lt. Side and was unable to speak.    Upon arrival to the ED. Pt. Has a lt. Side facial droop.   Pt. 's airway is stable.  Answers questions appropriately.  Skin is p.w.d, Pt. Is following commands

## 2016-04-08 NOTE — Code Documentation (Signed)
Patient presented to ED after passing in the car, per his wife, patient had Lt sided droop and was unable to speak.  Patient taken CT prior to my assessment, CT HEAD negative. Upon assessment, patient was alert, oriented, speech clear, and no neuro deficits present. NIH = 0, Dr. Tasia Catchings at bedside, code stroke cancelled. Patient has history of Parkinson's Disease and is having trouble regulating his BP at home, he has periods of both hypotension and hypertension.

## 2016-04-08 NOTE — ED Notes (Signed)
MD armstrong at bedside.

## 2016-04-08 NOTE — ED Notes (Signed)
Pt in hallway. Unable to collect urine sample at this time.

## 2016-04-08 NOTE — H&P (Signed)
History and Physical    LOUI Schultz [id number] DOB: [birth year] DOA: 04/08/2016  PCP: Glenda Chroman, MD   Patient coming from: Home.  Chief Complaint: LOC.  HPI: James Schultz is a 65 y.o. male with medical history significant of anxiety, depression, hypertension, headache, osteoarthritis, obstructive sleep apnea on CPAP, Parkinson's disease, urolithiasis, spinal stenosis, urinary incontinence secondary to Parkinson's disease who was brought to the emergency department be a private vehicle after he passed out on the passenger's seat, developed a left facial droop while parked at [employer] parking lot, witnessed by his wife who was in the driver's seat.  Per patient's wife, the patient suddenly developed the above mentioned symptoms. She subsequently called 911, but states that, given the close proximity to the hospital, she then decided to drive to the emergency department since she felt that the ambulance was going to take a lot longer to arrive there. The patient's symptoms subsided within 15 minutes of appearance. His facial droop has resolved and he is awake, alert, oriented 4.  ED Course: Code stroke was called. Lab work was unremarkable except for mild hyperglycemia, CT scan of the brain did not show acute infarct. Neurology consulted on the patient.  Review of Systems: As per HPI otherwise 10 point review of systems negative.   Past Medical History:  Diagnosis Date  . Anxiety   . Cervical spondylosis   . Degenerative disc disease, cervical   . Depression   . Headache(784.0)   . Hypertension   . OA (osteoarthritis)   . OSA (obstructive sleep apnea)    SEVERE OSA PER STUDY 2005--  USES NASAL CANNULA  WITH SETTING AT 12 -- NO MASK  . Parkinson's disease ([place]) NEUROLOGIST--  DR James Schultz   IN A STUDY AT [facility]--- lov note crae everywhere 02-19-2013 dr Kalman Shan scott  . Right ureteral stone   . Spinal stenosis of lumbar region   . Urinary incontinence    SECONDARY TO  PARKINSON'S DISEASE  . Wears glasses     Past Surgical History:  Procedure Laterality Date  . CHOLECYSTECTOMY  2000  . CYSTOSCOPY WITH RETROGRADE PYELOGRAM, URETEROSCOPY AND STENT PLACEMENT Right 11/13/2013   Procedure: CYSTOSCOPY WITH RIGHT URETEROSCOPY, Right Retrograde Pyelogram, RIGHT URETERAL STENT PLACEMENT, Basket Stone Extraction;  Surgeon: Ardis Hughs, MD;  Location: [facility];  Service: Urology;  Laterality: Right;  . HIP PINNING,CANNULATED Right 04/13/2013   Procedure: CANNULATED HIP PINNING;  Surgeon: Johnn Hai, MD;  Location: [facility];  Service: Orthopedics;  Laterality: Right;  PERCUTANEOUS CANNULATED RIGHT HIP PINNING   . HOLMIUM LASER APPLICATION Right [id number]   Procedure: RIGHT LASER LITHOTRIPSY;  Surgeon: Ardis Hughs, MD;  Location: [facility];  Service: Urology;  Laterality: Right;  . KNEE ARTHROSCOPY W/ DEBRIDEMENT Bilateral RIGHT  04-07-2008/   LEFT 10-13-2005  . LUMBAR LAMINECTOMY  01-05-2010   L2  --  L5  . TOTAL KNEE ARTHROPLASTY Left 03/04/2013   Procedure: LEFT TOTAL KNEE ARTHROPLASTY;  Surgeon: Gearlean Alf, MD;  Location: [facility];  Service: Orthopedics;  Laterality: Left;     reports that he has never smoked. He has never used smokeless tobacco. He reports that he does not drink alcohol or use drugs.  No Known Allergies  Family History  Problem Relation Age of Onset  . Cancer Mother   . Parkinson's disease      H/O  . Alzheimer's disease      H/O  . ALS  H/O     Prior to Admission medications   Medication Sig Start Date End Date Taking? Authorizing Provider  aspirin EC 81 MG tablet Take 81 mg by mouth every morning.   Yes Historical Provider, MD  carbidopa-levodopa (SINEMET CR) 50-200 MG per tablet Take 1 tablet by mouth at bedtime. 01/30/15  Yes Star Age, MD  carbidopa-levodopa (SINEMET IR) 25-100 MG per tablet Take 1 tablet by mouth 4 (four) times daily. May take an additional onehalf tab in  the morning when you wake up to decrease freezing episode Patient taking differently: Take 1.5-2 tablets by mouth See admin instructions. Take 2 tablets at 6 am then take 1 and 1/2 tablets at 10 am, 2 pm and 6 pm 09/17/14  Yes Star Age, MD  escitalopram (LEXAPRO) 10 MG tablet Take 10 mg by mouth 2 (two) times daily.   Yes Historical Provider, MD  lisinopril (PRINIVIL,ZESTRIL) 10 MG tablet Take 10 mg by mouth daily.  08/11/14  Yes Historical Provider, MD  ondansetron (ZOFRAN) 4 MG tablet Take 4 mg by mouth every 8 (eight) hours as needed for nausea or vomiting.   Yes Historical Provider, MD  pramipexole (MIRAPEX) 1 MG tablet Take 1 tablet (1 mg total) by mouth 3 (three) times daily. Patient taking differently: Take 0.5 mg by mouth 3 (three) times daily.  09/17/14  Yes Star Age, MD  rasagiline (AZILECT) 1 MG TABS tablet Take 1 tablet (1 mg total) by mouth every morning. 09/17/14  Yes Star Age, MD  tamsulosin (FLOMAX) 0.4 MG CAPS capsule Take 0.4 mg by mouth daily.   Yes Historical Provider, MD  LORazepam (ATIVAN) 1 MG tablet Take 1 tablet (1 mg total) by mouth 4 (four) times daily as needed for anxiety. Patient not taking: Reported on 04/08/2016 04/16/13   Domenic Polite, MD  phenazopyridine (PYRIDIUM) 200 MG tablet Take 1 tablet (200 mg total) by mouth 3 (three) times daily as needed for pain. Patient not taking: Reported on 04/08/2016 11/13/13   Ardis Hughs, MD    Physical Exam: Vitals:   04/08/16 2048 04/08/16 2224 04/08/16 2230 04/08/16 2300  BP: 124/60 135/72 146/84 139/81  Pulse: 77 71 71 73  Resp: 17 16 19 18   Temp:    98.5 F (36.9 C)  TempSrc:    Oral  SpO2: 98% 96% 96% 100%  Weight:          Constitutional: NAD, calm, comfortable Vitals:   04/08/16 2048 04/08/16 2224 04/08/16 2230 04/08/16 2300  BP: 124/60 135/72 146/84 139/81  Pulse: 77 71 71 73  Resp: 17 16 19 18   Temp:    98.5 F (36.9 C)  TempSrc:    Oral  SpO2: 98% 96% 96% 100%  Weight:       Eyes: PERRL,  lids and conjunctivae normal ENMT: Mucous membranes are moist. Posterior pharynx clear of any exudate or lesions.Normal dentition.  Neck: normal, supple, no masses, no thyromegaly Respiratory: clear to auscultation bilaterally, no wheezing, no crackles. Normal respiratory effort. No accessory muscle use.  Cardiovascular: Regular rate and rhythm, no murmurs / rubs / gallops. No extremity edema. 2+ pedal pulses. No carotid bruits.  Abdomen: no tenderness, no masses palpated. No hepatosplenomegaly. Bowel sounds positive.  Musculoskeletal: no clubbing / cyanosis. No joint deformity upper and lower extremities. Good ROM, no contractures. Normal muscle tone.  Skin: no rashes, lesions, ulcers. No induration Neurologic: Resting tremor. CN 2-12 grossly intact. Sensation intact, DTR normal. Strength 5/5 in all 4.  Psychiatric: Normal  judgment and insight. Alert and oriented x 4. Normal mood.     Labs on Admission: I have personally reviewed following labs and imaging studies  CBC:  Recent Labs Lab 04/08/16 1750 04/08/16 1758  WBC  --  8.7  NEUTROABS  --  5.2  HGB 13.9 13.7  HCT 41.0 41.8  MCV  --  86.4  PLT  --  [id number]   Basic Metabolic Panel:  Recent Labs Lab 04/08/16 1750 04/08/16 1758  NA 143 141  K 3.7 3.8  CL 106 106  CO2  --  25  GLUCOSE 119* 118*  BUN 19 15  CREATININE 0.90 1.03  CALCIUM  --  9.6   GFR: CrCl cannot be calculated (Unknown ideal weight.). Liver Function Tests:  Recent Labs Lab 04/08/16 1758  AST 23  ALT 5*  ALKPHOS 55  BILITOT 0.8  PROT 6.8  ALBUMIN 4.0   Coagulation Profile:  Recent Labs Lab 04/08/16 1758  INR 1.08   CBG:  Recent Labs Lab 04/08/16 1715  GLUCAP 126*   Urine analysis:    Component Value Date/Time   COLORURINE YELLOW 04/13/2013 0536   APPEARANCEUR CLEAR 04/13/2013 0536   LABSPEC 1.023 04/13/2013 0536   PHURINE 5.0 04/13/2013 0536   GLUCOSEU NEGATIVE 04/13/2013 0536   HGBUR NEGATIVE 04/13/2013 0536   BILIRUBINUR  NEGATIVE 04/13/2013 0536   KETONESUR NEGATIVE 04/13/2013 0536   PROTEINUR NEGATIVE 04/13/2013 0536   UROBILINOGEN 0.2 04/13/2013 0536   NITRITE NEGATIVE 04/13/2013 0536   LEUKOCYTESUR NEGATIVE 04/13/2013 0536     Radiological Exams on Admission: Ct Head Code Stroke Wo Contrast`  Result Date: 04/08/2016 CLINICAL DATA:  Code stroke.  Left facial droop.  Unresponsive EXAM: CT HEAD WITHOUT CONTRAST TECHNIQUE: Contiguous axial images were obtained from the base of the skull through the vertex without intravenous contrast. COMPARISON:  CT head 06/07/2006 FINDINGS: Generalized atrophy.  Negative for hydrocephalus. Negative for acute infarct. Negative for hemorrhage or mass. Mild chronic microvascular ischemic change in the white matter. Atherosclerotic calcification in the carotid and vertebral arteries bilaterally. Calcification in the supraclinoid internal carotid artery bilaterally. Calvarium negative. Sinusitis with air-fluid levels in the sphenoid sinus. ASPECTS [facility] Stroke Program Early CT Score) - Ganglionic level infarction (caudate, lentiform nuclei, internal capsule, insula, M1-M3 cortex): 7 - Supraganglionic infarction (M4-M6 cortex): 3 Total score (0-10 with 10 being normal): 10 IMPRESSION: 1. Generalized atrophy.  No acute infarct. 2. ASPECTS is 10 3. Air-fluid level sphenoid sinus. These results were called by telephone at the time of interpretation on 04/08/2016 at 5:46 pm to Dr. Tasia Catchings, who verbally acknowledged these results. Electronically Signed   By: Franchot Gallo M.D.   On: 04/08/2016 17:49    EKG: Independently reviewed. Vent. rate 70 BPM PR interval * ms QRS duration 102 ms QT/QTc 456/493 ms P-R-T axes 23 -26 39 Sinus rhythm Abnormal R-wave progression, early transition Left ventricular hypertrophy Borderline prolonged QT interval No significant change since last tracing  Assessment/Plan Principal Problem:   TIA (transient ischemic attack) Admit to  observation/telemetry. Frequent neuro checks. PT, OT and speech consultations. Check fasting lipids and hemoglobin A1c. Check echocardiogram. Check carotid Dopplers. Check MRI/MRA of the brain.  Active Problems:   Parkinson's disease ([place]) Continue Sinemet 1-1/2-2 tablets daily. Continue rasagiline 1 mg by mouth daily. Continue Mirapex 0.5 mg by mouth 3 times a day Continue supportive care.    Depression/Anxiety Lexapro 10 mg by mouth daily.    Essential hypertension, benign Per patient's wife, his blood pressure has  been fluctuating significantly lately. Continue lisinopril 10 mg by mouth daily for now. Monitor blood pressure periodically.      Osteoarthritis of left hip Continue analgesics as needed.    Sleep apnea. Continue CPAP at bedtime.  DVT prophylaxis: Lovenox SQ. Code Status: Full code. Family Communication: His wife was present in the room. Disposition Plan: Admit for TIA/CVA workup. Consults called: Neurology. Admission status: Observation/telemetry.   Reubin Milan MD [facility] Pager [phone].  If 7PM-7AM, please contact night-coverage [email or web address] Password [email or web address]  04/08/2016, 11:30 PM

## 2016-04-08 NOTE — Consult Note (Addendum)
Initial Neurological Consultation                      NEURO HOSPITALIST CONSULT NOTE   Requestig physician: Dr. Billy Fischer   Reason for Consult:  Episode of loss of consciousness.   HPI:                                                                                                                                            James Schultz is an 65 y.o. male who presented to the emergency room after an episode of loss of consciousness in his car. His wife reports that they were shopping at [employer] and that he was resting in the car. When she came back from her shopping she noted that he seemed poorly responsive. She noted there was some drooling from his mouth. She immediately brought him to the emergency room.  : Was immediately taken to CT which was normal. Upon evaluation after the CT scan he no neurological deficits. His wife reports he has recently been treated for significantly labile hypertension. He also has a known history of Parkinson's disease. She reports he has been undergoing recent changes in his blood pressure medications.     Past Medical History:  Diagnosis Date  . Anxiety   . Cervical spondylosis   . Degenerative disc disease, cervical   . Depression   . Headache(784.0)   . Hypertension   . OA (osteoarthritis)   . OSA (obstructive sleep apnea)    SEVERE OSA PER STUDY 2005--  USES NASAL CANNULA  WITH SETTING AT 12 -- NO MASK  . Parkinson's disease ([place]) NEUROLOGIST--  DR Jim Like   IN A STUDY AT [facility]--- lov note crae everywhere 02-19-2013 dr Kalman Shan scott  . Right ureteral stone   . Spinal stenosis of lumbar region   . Urinary incontinence    SECONDARY TO PARKINSON'S DISEASE  . Wears glasses     Past Surgical History:  Procedure Laterality Date  . CHOLECYSTECTOMY  2000  . CYSTOSCOPY WITH RETROGRADE PYELOGRAM, URETEROSCOPY AND STENT PLACEMENT Right 11/13/2013   Procedure: CYSTOSCOPY WITH RIGHT URETEROSCOPY, Right Retrograde Pyelogram, RIGHT  URETERAL STENT PLACEMENT, Basket Stone Extraction;  Surgeon: Ardis Hughs, MD;  Location: [facility];  Service: Urology;  Laterality: Right;  . HIP PINNING,CANNULATED Right 04/13/2013   Procedure: CANNULATED HIP PINNING;  Surgeon: Johnn Hai, MD;  Location: [facility];  Service: Orthopedics;  Laterality: Right;  PERCUTANEOUS CANNULATED RIGHT HIP PINNING   . HOLMIUM LASER APPLICATION Right [id number]   Procedure: RIGHT LASER LITHOTRIPSY;  Surgeon: Ardis Hughs, MD;  Location: [facility];  Service: Urology;  Laterality: Right;  . KNEE ARTHROSCOPY W/ DEBRIDEMENT Bilateral RIGHT  04-07-2008/   LEFT 10-13-2005  . LUMBAR LAMINECTOMY  01-05-2010   L2  --  L5  . TOTAL KNEE ARTHROPLASTY Left  03/04/2013   Procedure: LEFT TOTAL KNEE ARTHROPLASTY;  Surgeon: Gearlean Alf, MD;  Location: [facility];  Service: Orthopedics;  Laterality: Left;    MEDICATIONS:                                                                                                                     I have reviewed the patient's current medications.  No Known Allergies   Social History:  reports that he has never smoked. He has never used smokeless tobacco. He reports that he does not drink alcohol or use drugs.  Family History  Problem Relation Age of Onset  . Cancer Mother   . Parkinson's disease      H/O  . Alzheimer's disease      H/O  . ALS      H/O     ROS:                                                                                                                                       History obtained from chart review  General ROS: negative for - chills, fatigue, fever, night sweats, weight gain or weight loss Psychological ROS: negative for - behavioral disorder, hallucinations, memory difficulties, mood swings or suicidal ideation Ophthalmic ROS: negative for - blurry vision, double vision, eye pain or loss of vision ENT ROS: negative for - epistaxis, nasal discharge,  oral lesions, sore throat, tinnitus or vertigo Allergy and Immunology ROS: negative for - hives or itchy/watery eyes Hematological and Lymphatic ROS: negative for - bleeding problems, bruising or swollen lymph nodes Endocrine ROS: negative for - galactorrhea, hair pattern changes, polydipsia/polyuria or temperature intolerance Respiratory ROS: negative for - cough, hemoptysis, shortness of breath or wheezing Cardiovascular ROS: negative for - chest pain, dyspnea on exertion, edema or irregular heartbeat Gastrointestinal ROS: negative for - abdominal pain, diarrhea, hematemesis, nausea/vomiting or stool incontinence Genito-Urinary ROS: negative for - dysuria, hematuria, incontinence or urinary frequency/urgency Musculoskeletal ROS: negative for - joint swelling or muscular weakness Neurological ROS: as noted in HPI Dermatological ROS: negative for rash and skin lesion changes   General Exam  Blood pressure 108/59, pulse 69, resp. rate 17, weight 123.9 kg (273 lb 2.4 oz), SpO2 97 %. HEENT-  Normocephalic, no lesions, without obvious abnormality.  Normal external eye and conjunctiva.  Normal TM's bilaterally.  Normal auditory canals and external ears. Normal external nose, mucus membranes and septum.  Normal pharynx. Cardiovascular- regular rate and rhythm, S1, S2 normal, no murmur, click, rub or gallop, pulses palpable throughout   Lungs- chest clear, no wheezing, rales, normal symmetric air entry, Heart exam - S1, S2 normal, no murmur, no gallop, rate regular Abdomen- soft, non-tender; bowel sounds normal; no masses,  no organomegaly Extremities- less then 2 second capillary refill   Neurological Examination Mental Status: Alert, oriented, thought content appropriate.  Speech fluent without evidence of aphasia.  Able to follow 3 step commands without difficulty. Cranial Nerves: Pupils are  equal round reactive to light and accommodation. Extraocular movements reveal mild limitation of vertical gaze. There is no facial asymmetry. Hearing is intact. Neck flexion and shoulder shrug are 5 out of 5. Tongue movements are full. Decreased facial expression is noted. Motor: Right : Upper extremity   5/5    Left:     Upper extremity   5/5  Lower extremity   5/5     Lower extremity   5/5 Tone and bulk:normal tone throughout; no atrophy noted Sensory: Pinprick and light touch intact throughout, bilaterally Deep Tendon Reflexes: 1-2+ and symmetric throughout Plantars: Right: downgoing   Left: downgoing Cerebellar: Tremor with finger-to-nose, normal rapid alternating movements and normal heel-to-shin test. Resting tremor is noted. Gait: normal gait and station Tone: Tone is increased throughout with cogwheeling and rigidity.  Lab Results: Basic Metabolic Panel:  Recent Labs Lab 04/08/16 1750 04/08/16 1758  NA 143 141  K 3.7 3.8  CL 106 106  CO2  --  25  GLUCOSE 119* 118*  BUN 19 15  CREATININE 0.90 1.03  CALCIUM  --  9.6    Liver Function Tests:  Recent Labs Lab 04/08/16 1758  AST 23  ALT 5*  ALKPHOS 55  BILITOT 0.8  PROT 6.8  ALBUMIN 4.0   No results for input(s): LIPASE, AMYLASE in the last 168 hours. No results for input(s): AMMONIA in the last 168 hours.  CBC:  Recent Labs Lab 04/08/16 1750 04/08/16 1758  WBC  --  8.7  NEUTROABS  --  5.2  HGB 13.9 13.7  HCT 41.0 41.8  MCV  --  86.4  PLT  --  172    Cardiac Enzymes: No results for input(s): CKTOTAL, CKMB, CKMBINDEX, TROPONINI in the last 168 hours.  Lipid Panel: No results for input(s): CHOL, TRIG, HDL, CHOLHDL, VLDL, LDLCALC in the last 168 hours.  CBG:  Recent Labs Lab 04/08/16 1715  GLUCAP 126*    Microbiology: Results for orders placed or performed during the hospital encounter of 04/12/13  MRSA PCR Screening     Status: None   Collection Time: 04/12/13 11:59 PM  Result Value Ref  Range Status   MRSA by PCR NEGATIVE NEGATIVE Final    Comment:        The GeneXpert MRSA Assay (FDA approved for NASAL specimens only), is one component of a comprehensive MRSA colonization surveillance program. It is not intended to diagnose MRSA infection nor to guide or monitor treatment for MRSA infections.    Coagulation Studies:  Recent Labs  04/08/16 1758  LABPROT 14.0  INR 1.08    Imaging: Ct Head Code Stroke Wo Contrast`  Result Date:  04/08/2016 CLINICAL DATA:  Code stroke.  Left facial droop.  Unresponsive EXAM: CT HEAD WITHOUT CONTRAST TECHNIQUE: Contiguous axial images were obtained from the base of the skull through the vertex without intravenous contrast. COMPARISON:  CT head 06/07/2006 FINDINGS: Generalized atrophy.  Negative for hydrocephalus. Negative for acute infarct. Negative for hemorrhage or mass. Mild chronic microvascular ischemic change in the white matter. Atherosclerotic calcification in the carotid and vertebral arteries bilaterally. Calcification in the supraclinoid internal carotid artery bilaterally. Calvarium negative. Sinusitis with air-fluid levels in the sphenoid sinus. ASPECTS [facility] Stroke Program Early CT Score) - Ganglionic level infarction (caudate, lentiform nuclei, internal capsule, insula, M1-M3 cortex): 7 - Supraganglionic infarction (M4-M6 cortex): 3 Total score (0-10 with 10 being normal): 10 IMPRESSION: 1. Generalized atrophy.  No acute infarct. 2. ASPECTS is 10 3. Air-fluid level sphenoid sinus. These results were called by telephone at the time of interpretation on 04/08/2016 at 5:46 pm to Dr. Tasia Catchings, who verbally acknowledged these results. Electronically Signed   By: Franchot Gallo M.D.   On: 04/08/2016 17:49    Assessment/Plan:  Advait is a pleasant 65 year old gentleman who presents today with an episode of decreased responsiveness. His CT is normal. His neurologic exam is normal with the exception of his known history of  Parkinson's disease. He has no focal neurological deficits and no cranial nerve symptoms with the exception of the hypomimia.  It is suspected that this event is most likely an episode of decreased cerebral perfusion related to his known history of labile hypertension. However, a small ischemic event but this quickly resolved to not be completely ruled out although seems less likely. MRI could be performed to further evaluate this possibility. TPA was not administered as it was felt unlikely the patient had experienced an ischemic event and had a normal neurological examination with an NIHSS of 0.  Plan:  1. If the concern for an ischemic event is high MRI could be performed.  2. Continue present therapy for Parkinson's disease  3. Further management of heart rate and blood pressure as per ER and medicine teams.   Anubis Fundora A. Tasia Catchings, M.D. Neurohospitalist Phone: [phone]  04/08/2016, 6:40 PM

## 2016-04-08 NOTE — ED Notes (Signed)
cbg-126 °

## 2016-04-08 NOTE — ED Provider Notes (Signed)
[facility] DEPT Provider Note   CSN: [id number] Arrival date & time: 04/08/16  1708     History   Chief Complaint Chief Complaint  Patient presents with  . Loss of Consciousness    HPI James Schultz is a 65 y.o. male.  The history is provided by the patient, the spouse and medical records. No language interpreter was used.  Altered Mental Status   This is a new problem. The current episode started less than 1 hour ago. The problem has been gradually improving. Associated symptoms include confusion and weakness.    Past Medical History:  Diagnosis Date  . Anxiety   . Cervical spondylosis   . Degenerative disc disease, cervical   . Depression   . Headache(784.0)   . Hypertension   . OA (osteoarthritis)   . OSA (obstructive sleep apnea)    SEVERE OSA PER STUDY 2005--  USES NASAL CANNULA  WITH SETTING AT 12 -- NO MASK  . Parkinson's disease ([place]) NEUROLOGIST--  DR Jim Like   IN A STUDY AT [facility]--- lov note crae everywhere 02-19-2013 dr Kalman Shan scott  . Right ureteral stone   . Spinal stenosis of lumbar region   . Urinary incontinence    SECONDARY TO PARKINSON'S DISEASE  . Wears glasses     Patient Active Problem List   Diagnosis Date Noted  . Osteoarthritis of left hip 04/09/2016  . Acute CVA (cerebrovascular accident) ([place]) 04/09/2016  . TIA (transient ischemic attack) 04/08/2016  . Anxiety 04/08/2016  . Depression 05/14/2013  . Paralysis agitans ([place]) 05/14/2013  . Essential hypertension, benign 05/14/2013  . Femur fracture ([place]) 05/14/2013  . Closed right hip fracture ([place]) 04/13/2013  . Unspecified essential hypertension 03/05/2013  . Sleep apnea 03/05/2013  . Parkinson's disease ([place]) 03/05/2013  . OA (osteoarthritis) of knee 03/04/2013    Past Surgical History:  Procedure Laterality Date  . CHOLECYSTECTOMY  2000  . CYSTOSCOPY WITH RETROGRADE PYELOGRAM, URETEROSCOPY AND STENT PLACEMENT Right 11/13/2013   Procedure: CYSTOSCOPY WITH RIGHT  URETEROSCOPY, Right Retrograde Pyelogram, RIGHT URETERAL STENT PLACEMENT, Basket Stone Extraction;  Surgeon: Ardis Hughs, MD;  Location: [facility];  Service: Urology;  Laterality: Right;  . HIP PINNING,CANNULATED Right 04/13/2013   Procedure: CANNULATED HIP PINNING;  Surgeon: Johnn Hai, MD;  Location: [facility];  Service: Orthopedics;  Laterality: Right;  PERCUTANEOUS CANNULATED RIGHT HIP PINNING   . HOLMIUM LASER APPLICATION Right [id number]   Procedure: RIGHT LASER LITHOTRIPSY;  Surgeon: Ardis Hughs, MD;  Location: [facility];  Service: Urology;  Laterality: Right;  . KNEE ARTHROSCOPY W/ DEBRIDEMENT Bilateral RIGHT  04-07-2008/   LEFT 10-13-2005  . LUMBAR LAMINECTOMY  01-05-2010   L2  --  L5  . TOTAL KNEE ARTHROPLASTY Left 03/04/2013   Procedure: LEFT TOTAL KNEE ARTHROPLASTY;  Surgeon: Gearlean Alf, MD;  Location: [facility];  Service: Orthopedics;  Laterality: Left;       Home Medications    Prior to Admission medications   Medication Sig Start Date End Date Taking? Authorizing Provider  aspirin EC 81 MG tablet Take 81 mg by mouth every morning.   Yes Historical Provider, MD  carbidopa-levodopa (SINEMET CR) 50-200 MG per tablet Take 1 tablet by mouth at bedtime. 01/30/15  Yes Star Age, MD  carbidopa-levodopa (SINEMET IR) 25-100 MG per tablet Take 1 tablet by mouth 4 (four) times daily. May take an additional onehalf tab in the morning when you wake up to decrease freezing episode Patient taking  differently: Take 1.5-2 tablets by mouth See admin instructions. Take 2 tablets at 6 am then take 1 and 1/2 tablets at 10 am, 2 pm and 6 pm 09/17/14  Yes Star Age, MD  escitalopram (LEXAPRO) 10 MG tablet Take 10 mg by mouth 2 (two) times daily.   Yes Historical Provider, MD  lisinopril (PRINIVIL,ZESTRIL) 10 MG tablet Take 10 mg by mouth daily.  08/11/14  Yes Historical Provider, MD  ondansetron (ZOFRAN) 4 MG tablet Take 4 mg by mouth every 8 (eight)  hours as needed for nausea or vomiting.   Yes Historical Provider, MD  oxyCODONE-acetaminophen (PERCOCET/ROXICET) 5-325 MG tablet Take 1 tablet by mouth every 6 (six) hours as needed for severe pain.   Yes Historical Provider, MD  pramipexole (MIRAPEX) 1 MG tablet Take 1 tablet (1 mg total) by mouth 3 (three) times daily. Patient taking differently: Take 0.5 mg by mouth 3 (three) times daily.  09/17/14  Yes Star Age, MD  rasagiline (AZILECT) 1 MG TABS tablet Take 1 tablet (1 mg total) by mouth every morning. 09/17/14  Yes Star Age, MD  tamsulosin (FLOMAX) 0.4 MG CAPS capsule Take 0.4 mg by mouth daily.   Yes Historical Provider, MD  LORazepam (ATIVAN) 1 MG tablet Take 1 tablet (1 mg total) by mouth 4 (four) times daily as needed for anxiety. Patient not taking: Reported on 04/08/2016 04/16/13   Domenic Polite, MD  phenazopyridine (PYRIDIUM) 200 MG tablet Take 1 tablet (200 mg total) by mouth 3 (three) times daily as needed for pain. Patient not taking: Reported on 04/08/2016 11/13/13   Ardis Hughs, MD    Family History Family History  Problem Relation Age of Onset  . Cancer Mother   . Parkinson's disease      H/O  . Alzheimer's disease      H/O  . ALS      H/O    Social History Social History  Substance Use Topics  . Smoking status: Never Smoker  . Smokeless tobacco: Never Used  . Alcohol use No     Allergies   Review of patient's allergies indicates no known allergies.   Review of Systems Review of Systems  Constitutional: Positive for fatigue. Negative for activity change and fever.  HENT: Positive for drooling.   Respiratory: Negative for shortness of breath.   Cardiovascular: Negative for chest pain.  Gastrointestinal: Negative for abdominal pain.  Skin: Positive for pallor.  Neurological: Positive for facial asymmetry, speech difficulty, weakness and light-headedness.  Psychiatric/Behavioral: Positive for confusion.     Physical Exam Updated Vital Signs BP  (!) 154/91 (BP Location: Right Arm)   Pulse 86   Temp 98.7 F (37.1 C) (Oral)   Resp 18   Wt 123.9 kg   SpO2 97%   BMI 39.19 kg/m   Physical Exam  Constitutional: He appears well-developed and well-nourished.  HENT:  Head: Normocephalic and atraumatic.  Eyes: Conjunctivae are normal. Pupils are equal, round, and reactive to light.  Neck: Neck supple.  Cardiovascular: Normal rate and regular rhythm.   No murmur heard. Pulmonary/Chest: Effort normal and breath sounds normal. No respiratory distress.  Abdominal: Soft. There is no tenderness.  Musculoskeletal: He exhibits no edema.  Neurological: He is alert. A cranial nerve deficit is present. He exhibits abnormal muscle tone.  L facial droop and LE weakness.  Skin: Skin is warm and dry.  Psychiatric: He has a normal mood and affect.  Nursing note and vitals reviewed.    ED Treatments /  Results  Labs (all labs ordered are listed, but only abnormal results are displayed) Labs Reviewed  APTT - Abnormal; Notable for the following:       Result Value   aPTT 21 (*)    All other components within normal limits  COMPREHENSIVE METABOLIC PANEL - Abnormal; Notable for the following:    Glucose, Bld 118 (*)    ALT 5 (*)    All other components within normal limits  URINE RAPID DRUG SCREEN, HOSP PERFORMED - Abnormal; Notable for the following:    Benzodiazepines POSITIVE (*)    All other components within normal limits  LIPID PANEL - Abnormal; Notable for the following:    Triglycerides 182 (*)    HDL 29 (*)    LDL Cholesterol 102 (*)    All other components within normal limits  COMPREHENSIVE METABOLIC PANEL - Abnormal; Notable for the following:    Glucose, Bld 117 (*)    ALT 8 (*)    All other components within normal limits  CBC - Abnormal; Notable for the following:    WBC 12.1 (*)    All other components within normal limits  CBG MONITORING, ED - Abnormal; Notable for the following:    Glucose-Capillary 126 (*)    All  other components within normal limits  I-STAT CHEM 8, ED - Abnormal; Notable for the following:    Glucose, Bld 119 (*)    Calcium, Ion 1.05 (*)    All other components within normal limits  MRSA PCR SCREENING  ETHANOL  PROTIME-INR  CBC  DIFFERENTIAL  URINALYSIS, ROUTINE W REFLEX MICROSCOPIC (NOT AT [facility])  HEMOGLOBIN A1C  I-STAT TROPOININ, ED    EKG  EKG Interpretation  Date/Time:  Friday April 08 2016 17:18:05 EDT Ventricular Rate:  69 PR Interval:    QRS Duration: 99 QT Interval:  446 QTC Calculation: 478 R Axis:   -25 Text Interpretation:  Sinus rhythm Abnormal R-wave progression, early transition Left ventricular hypertrophy Borderline prolonged QT interval Confirmed by ZAMMIT  MD, JOSEPH [id number]) on 04/09/2016 12:14:41 PM       Radiology Dg Chest 1 View  Result Date: 04/09/2016 CLINICAL DATA:  TIA EXAM: CHEST 1 VIEW COMPARISON:  02/08/2016 FINDINGS: Cardiac enlargement with pulmonary vascular congestion. No edema or effusion Hypoventilation with mild bibasilar atelectasis IMPRESSION: Pulmonary vascular congestion Hypoventilation Electronically Signed   By: Franchot Gallo M.D.   On: 04/09/2016 07:43   Mr Brain Wo Contrast  Result Date: 04/09/2016 CLINICAL DATA:  Syncopal episode, LEFT facial droop. History of hypertension, Parkinson's disease. EXAM: MRI HEAD WITHOUT CONTRAST MRA HEAD WITHOUT CONTRAST TECHNIQUE: Multiplanar, multiecho pulse sequences of the brain and surrounding structures were obtained without intravenous contrast. Angiographic images of the head were obtained using MRA technique without contrast. COMPARISON:  CT HEAD April 08, 2016 FINDINGS: MRI HEAD FINDINGS INTRACRANIAL CONTENTS: 14 x 16 mm reduced diffusion RIGHT pons with low ADC value in faint FLAIR T2 hyperintense signal. Mild ventriculomegaly on the basis of global parenchymal brain volume loss. Scattered sub cm supratentorial white matter FLAIR T2 hyperintensities are within normal range for  patient's age. No midline shift, mass effect or masses. No extra-axial masses though, contrast enhanced sequences would be more sensitive. Dolicoectatic major intracranial vascular flow voids present at skull base. ORBITS: The included ocular globes and orbital contents are non-suspicious. Status post bilateral ocular lens implants. SINUSES: Mild paranasal sinus mucosal thickening with RIGHT sphenoid sinus air-fluid level. Mastoid air cells are well aerated. SKULL/SOFT TISSUES: No abnormal  sellar expansion. No suspicious calvarial bone marrow signal. Craniocervical junction maintained. MRA HEAD FINDINGS ANTERIOR CIRCULATION: Normal flow related enhancement of the included cervical, petrous, cavernous and supraclinoid internal carotid arteries. Patent anterior communicating artery. Normal flow related enhancement of the anterior and middle cerebral arteries, including distal segments. Dolichoectasia. No large vessel occlusion, high-grade stenosis, abnormal luminal irregularity, aneurysm. POSTERIOR CIRCULATION: Codominant vertebral artery's. Basilar artery is patent, decreased flow related enhancement with normal caliber compatible with slow flow. Normal flow related enhancement of the main branch vessels. Normal flow related enhancement of the posterior cerebral arteries. Dolichoectasia. No large vessel occlusion, high-grade stenosis, abnormal luminal irregularity, aneurysm. 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 Mild global parenchymal brain volume loss for age. Mild chronic small vessel ischemic disease. MRA HEAD: No emergent large vessel occlusion or high-grade stenosis. Dolichoectasia compatible chronic hypertension. Electronically Signed   By: Elon Alas M.D.   On: 04/09/2016 05:49   Mr Jodene Nam Head/brain [id number] Cm  Result Date: 04/09/2016 CLINICAL DATA:  Syncopal episode, LEFT facial droop. History of hypertension, Parkinson's disease. EXAM: MRI HEAD WITHOUT CONTRAST MRA HEAD WITHOUT  CONTRAST TECHNIQUE: Multiplanar, multiecho pulse sequences of the brain and surrounding structures were obtained without intravenous contrast. Angiographic images of the head were obtained using MRA technique without contrast. COMPARISON:  CT HEAD April 08, 2016 FINDINGS: MRI HEAD FINDINGS INTRACRANIAL CONTENTS: 14 x 16 mm reduced diffusion RIGHT pons with low ADC value in faint FLAIR T2 hyperintense signal. Mild ventriculomegaly on the basis of global parenchymal brain volume loss. Scattered sub cm supratentorial white matter FLAIR T2 hyperintensities are within normal range for patient's age. No midline shift, mass effect or masses. No extra-axial masses though, contrast enhanced sequences would be more sensitive. Dolicoectatic major intracranial vascular flow voids present at skull base. ORBITS: The included ocular globes and orbital contents are non-suspicious. Status post bilateral ocular lens implants. SINUSES: Mild paranasal sinus mucosal thickening with RIGHT sphenoid sinus air-fluid level. Mastoid air cells are well aerated. SKULL/SOFT TISSUES: No abnormal sellar expansion. No suspicious calvarial bone marrow signal. Craniocervical junction maintained. MRA HEAD FINDINGS ANTERIOR CIRCULATION: Normal flow related enhancement of the included cervical, petrous, cavernous and supraclinoid internal carotid arteries. Patent anterior communicating artery. Normal flow related enhancement of the anterior and middle cerebral arteries, including distal segments. Dolichoectasia. No large vessel occlusion, high-grade stenosis, abnormal luminal irregularity, aneurysm. POSTERIOR CIRCULATION: Codominant vertebral artery's. Basilar artery is patent, decreased flow related enhancement with normal caliber compatible with slow flow. Normal flow related enhancement of the main branch vessels. Normal flow related enhancement of the posterior cerebral arteries. Dolichoectasia. No large vessel occlusion, high-grade stenosis,  abnormal luminal irregularity, aneurysm. 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 Mild global parenchymal brain volume loss for age. Mild chronic small vessel ischemic disease. MRA HEAD: No emergent large vessel occlusion or high-grade stenosis. Dolichoectasia compatible chronic hypertension. Electronically Signed   By: Elon Alas M.D.   On: 04/09/2016 05:49   Ct Head Code Stroke Wo Contrast`  Result Date: 04/08/2016 CLINICAL DATA:  Code stroke.  Left facial droop.  Unresponsive EXAM: CT HEAD WITHOUT CONTRAST TECHNIQUE: Contiguous axial images were obtained from the base of the skull through the vertex without intravenous contrast. COMPARISON:  CT head 06/07/2006 FINDINGS: Generalized atrophy.  Negative for hydrocephalus. Negative for acute infarct. Negative for hemorrhage or mass. Mild chronic microvascular ischemic change in the white matter. Atherosclerotic calcification in the carotid and vertebral arteries bilaterally. Calcification in the supraclinoid  internal carotid artery bilaterally. Calvarium negative. Sinusitis with air-fluid levels in the sphenoid sinus. ASPECTS [facility] Stroke Program Early CT Score) - Ganglionic level infarction (caudate, lentiform nuclei, internal capsule, insula, M1-M3 cortex): 7 - Supraganglionic infarction (M4-M6 cortex): 3 Total score (0-10 with 10 being normal): 10 IMPRESSION: 1. Generalized atrophy.  No acute infarct. 2. ASPECTS is 10 3. Air-fluid level sphenoid sinus. These results were called by telephone at the time of interpretation on 04/08/2016 at 5:46 pm to Dr. Tasia Catchings, who verbally acknowledged these results. Electronically Signed   By: Franchot Gallo M.D.   On: 04/08/2016 17:49    Procedures Procedures (including critical care time)  Medications Ordered in ED Medications  lisinopril (PRINIVIL,ZESTRIL) tablet 10 mg (10 mg Oral Given 04/09/16 1004)  pramipexole (MIRAPEX) tablet 0.5 mg (0.5 mg Oral Given 04/09/16 1520)  rasagiline  (AZILECT) tablet 1 mg (1 mg Oral Given 04/09/16 0838)  carbidopa-levodopa (SINEMET CR) 50-200 MG per tablet controlled release 1 tablet (1 tablet Oral Not Given 04/09/16 0116)  escitalopram (LEXAPRO) tablet 10 mg (10 mg Oral Given 04/09/16 1004)  tamsulosin (FLOMAX) capsule 0.4 mg (0.4 mg Oral Given 04/09/16 1005)  ondansetron (ZOFRAN) tablet 4 mg (not administered)  enoxaparin (LOVENOX) injection 60 mg (60 mg Subcutaneous Given 04/09/16 0839)  sodium chloride flush (NS) 0.9 % injection 3 mL (3 mLs Intravenous Given 04/09/16 1000)  oxyCODONE-acetaminophen (PERCOCET/ROXICET) 5-325 MG per tablet 1 tablet (1 tablet Oral Given 04/09/16 1524)  carbidopa-levodopa (SINEMET IR) 25-100 MG per tablet immediate release 2 tablet (2 tablets Oral Given 04/09/16 0647)  carbidopa-levodopa (SINEMET IR) 25-100 MG per tablet immediate release 1.5 tablet (1.5 tablets Oral Given 04/09/16 1520)  senna-docusate (Senokot-S) tablet 2 tablet (2 tablets Oral Given 04/09/16 0353)  clopidogrel (PLAVIX) tablet 75 mg (75 mg Oral Given 04/09/16 1004)  atorvastatin (LIPITOR) tablet 20 mg (not administered)   stroke: mapping our early stages of recovery book (1 each Does not apply Given 04/09/16 0116)  ALPRAZolam Duanne Moron) tablet 1 mg (1 mg Oral Given 04/09/16 0425)     Initial Impression / Assessment and Plan / ED Course  I have reviewed the triage vital signs and the nursing notes.  Pertinent labs & imaging results that were available during my care of the patient were reviewed by me and considered in my medical decision making (see chart for details).  Clinical Course    MDM: Patient is a 65 year old male with past medical history of hypertension who comes in today brought by EMS following an unresponsive episode. Per patient's wife, patient sitting in the car while at [facility] When the wife returned patient was drooling and was not able to speak or answer questions. Bilateral ED patient was alert and oriented 33-year-old answer  questions appropriately. Patient was significantly diaphoretic. Patient denies recent illness, chest pain, vomiting or diarrhea. Patient was weakness and nausea.  Physical exam: Patient alert and oriented 4. Patient significant diaphoretic. Patient clear to auscultation bilaterally with normal S1 and S2. Patient's abdomen is soft and nontender. On neurological examination, patient has left-sided facial droop, as well as left lower extremity weakness. Bilateral upper extremities with normal strength. Remainder of cranial nerve exam within normal limits.  Given findings on exam, code stroke was called. Patient evaluated by neurology. Head CT did not show any evidence of acute abnormality. Concern for TIA given patient's presentation and resolution. Patient will be admitted for observation.  Final Clinical Impressions(s) / ED Diagnoses   Final diagnoses:  Stroke [facility])  TIA (transient ischemic attack)  New Prescriptions Current Discharge Medication List       Chapman Moss, MD 04/11/16 [id number]    Gareth Morgan, MD 04/12/16 [phone]

## 2016-04-09 ENCOUNTER — Observation Stay (HOSPITAL_COMMUNITY): Payer: Medicare Other

## 2016-04-09 ENCOUNTER — Encounter (HOSPITAL_COMMUNITY): Payer: Self-pay | Admitting: Internal Medicine

## 2016-04-09 DIAGNOSIS — G4733 Obstructive sleep apnea (adult) (pediatric): Secondary | ICD-10-CM | POA: Diagnosis not present

## 2016-04-09 DIAGNOSIS — R410 Disorientation, unspecified: Secondary | ICD-10-CM | POA: Diagnosis not present

## 2016-04-09 DIAGNOSIS — G4730 Sleep apnea, unspecified: Secondary | ICD-10-CM | POA: Diagnosis not present

## 2016-04-09 DIAGNOSIS — I6789 Other cerebrovascular disease: Secondary | ICD-10-CM | POA: Diagnosis not present

## 2016-04-09 DIAGNOSIS — Z7982 Long term (current) use of aspirin: Secondary | ICD-10-CM | POA: Diagnosis not present

## 2016-04-09 DIAGNOSIS — I6302 Cerebral infarction due to thrombosis of basilar artery: Secondary | ICD-10-CM | POA: Diagnosis not present

## 2016-04-09 DIAGNOSIS — F419 Anxiety disorder, unspecified: Secondary | ICD-10-CM | POA: Diagnosis not present

## 2016-04-09 DIAGNOSIS — R739 Hyperglycemia, unspecified: Secondary | ICD-10-CM | POA: Diagnosis present

## 2016-04-09 DIAGNOSIS — Z79899 Other long term (current) drug therapy: Secondary | ICD-10-CM | POA: Diagnosis not present

## 2016-04-09 DIAGNOSIS — R55 Syncope and collapse: Secondary | ICD-10-CM | POA: Diagnosis present

## 2016-04-09 DIAGNOSIS — Z96652 Presence of left artificial knee joint: Secondary | ICD-10-CM | POA: Diagnosis present

## 2016-04-09 DIAGNOSIS — Z820 Family history of epilepsy and other diseases of the nervous system: Secondary | ICD-10-CM | POA: Diagnosis not present

## 2016-04-09 DIAGNOSIS — R0989 Other specified symptoms and signs involving the circulatory and respiratory systems: Secondary | ICD-10-CM | POA: Diagnosis not present

## 2016-04-09 DIAGNOSIS — I639 Cerebral infarction, unspecified: Principal | ICD-10-CM

## 2016-04-09 DIAGNOSIS — I119 Hypertensive heart disease without heart failure: Secondary | ICD-10-CM | POA: Diagnosis present

## 2016-04-09 DIAGNOSIS — Z9049 Acquired absence of other specified parts of digestive tract: Secondary | ICD-10-CM | POA: Diagnosis not present

## 2016-04-09 DIAGNOSIS — I10 Essential (primary) hypertension: Secondary | ICD-10-CM | POA: Diagnosis not present

## 2016-04-09 DIAGNOSIS — F329 Major depressive disorder, single episode, unspecified: Secondary | ICD-10-CM | POA: Diagnosis not present

## 2016-04-09 DIAGNOSIS — E785 Hyperlipidemia, unspecified: Secondary | ICD-10-CM | POA: Diagnosis present

## 2016-04-09 DIAGNOSIS — M1612 Unilateral primary osteoarthritis, left hip: Secondary | ICD-10-CM | POA: Diagnosis present

## 2016-04-09 DIAGNOSIS — G458 Other transient cerebral ischemic attacks and related syndromes: Secondary | ICD-10-CM | POA: Diagnosis not present

## 2016-04-09 DIAGNOSIS — I6350 Cerebral infarction due to unspecified occlusion or stenosis of unspecified cerebral artery: Secondary | ICD-10-CM | POA: Diagnosis not present

## 2016-04-09 DIAGNOSIS — G20 Parkinson's disease: Secondary | ICD-10-CM | POA: Diagnosis not present

## 2016-04-09 DIAGNOSIS — R29810 Facial weakness: Secondary | ICD-10-CM | POA: Diagnosis not present

## 2016-04-09 DIAGNOSIS — E876 Hypokalemia: Secondary | ICD-10-CM | POA: Diagnosis not present

## 2016-04-09 DIAGNOSIS — F4323 Adjustment disorder with mixed anxiety and depressed mood: Secondary | ICD-10-CM | POA: Diagnosis not present

## 2016-04-09 DIAGNOSIS — G459 Transient cerebral ischemic attack, unspecified: Secondary | ICD-10-CM | POA: Diagnosis not present

## 2016-04-09 DIAGNOSIS — M47812 Spondylosis without myelopathy or radiculopathy, cervical region: Secondary | ICD-10-CM | POA: Diagnosis not present

## 2016-04-09 LAB — COMPREHENSIVE METABOLIC PANEL
ALBUMIN: 3.8 g/dL (ref 3.5–5.0)
ALK PHOS: 54 U/L (ref 38–126)
ALT: 8 U/L — ABNORMAL LOW (ref 17–63)
ANION GAP: 6 (ref 5–15)
AST: 21 U/L (ref 15–41)
BILIRUBIN TOTAL: 0.7 mg/dL (ref 0.3–1.2)
BUN: 16 mg/dL (ref 6–20)
CALCIUM: 8.9 mg/dL (ref 8.9–10.3)
CO2: 24 mmol/L (ref 22–32)
Chloride: 110 mmol/L (ref 101–111)
Creatinine, Ser: 0.77 mg/dL (ref 0.61–1.24)
GFR calc Af Amer: >60 mL/min (ref >60)
GFR calc non Af Amer: >60 mL/min (ref >60)
Glucose, Bld: 117 mg/dL — ABNORMAL HIGH (ref 65–99)
Potassium: 3.6 mmol/L (ref 3.5–5.1)
Sodium: 140 mmol/L (ref 135–145)
TOTAL PROTEIN: 6.7 g/dL (ref 6.5–8.1)

## 2016-04-09 LAB — CBC
HCT: 41.8 % (ref 39.0–52.0)
HEMOGLOBIN: 13.4 g/dL (ref 13.0–17.0)
MCH: 27.7 pg (ref 26.0–34.0)
MCHC: 32.1 g/dL (ref 30.0–36.0)
MCV: 86.4 fL (ref 78.0–100.0)
Platelets: 170 10*3/uL (ref 150–400)
RBC: 4.84 MIL/uL (ref 4.22–5.81)
RDW: 13.8 % (ref 11.5–15.5)
WBC: 12.1 10*3/uL — AB (ref 4.0–10.5)

## 2016-04-09 LAB — URINALYSIS, ROUTINE W REFLEX MICROSCOPIC
Bilirubin Urine: NEGATIVE
Glucose, UA: NEGATIVE mg/dL
Hgb urine dipstick: NEGATIVE
KETONES UR: NEGATIVE mg/dL
LEUKOCYTES UA: NEGATIVE
NITRITE: NEGATIVE
PH: 5 (ref 5.0–8.0)
Protein, ur: NEGATIVE mg/dL
Specific Gravity, Urine: 1.022 (ref 1.005–1.030)

## 2016-04-09 LAB — RAPID URINE DRUG SCREEN, HOSP PERFORMED
Amphetamines: NOT DETECTED
Barbiturates: NOT DETECTED
Benzodiazepines: POSITIVE — AB
COCAINE: NOT DETECTED
OPIATES: NOT DETECTED
TETRAHYDROCANNABINOL: NOT DETECTED

## 2016-04-09 LAB — ECHOCARDIOGRAM LIMITED
FS: 24 % — AB (ref 28–44)
IVS/LV PW RATIO, ED: 1.37
LA ID, A-P, ES: 44 mm
LA diam index: 1.85 cm/m2
LEFT ATRIUM END SYS DIAM: 44 mm
LVOT area: 5.31 cm2
LVOT diameter: 26 mm
PW: 14.9 mm — AB (ref 0.6–1.1)
WEIGHTICAEL: 4370.4 [oz_av]

## 2016-04-09 LAB — LIPID PANEL
CHOLESTEROL: 167 mg/dL (ref 0–200)
HDL: 29 mg/dL — ABNORMAL LOW (ref >40)
LDL Cholesterol: 102 mg/dL — ABNORMAL HIGH (ref 0–99)
Total CHOL/HDL Ratio: 5.8 RATIO
Triglycerides: 182 mg/dL — ABNORMAL HIGH (ref <150)
VLDL: 36 mg/dL (ref 0–40)

## 2016-04-09 LAB — MRSA PCR SCREENING: MRSA BY PCR: NEGATIVE

## 2016-04-09 MED ORDER — CARBIDOPA-LEVODOPA 25-100 MG PO TABS
2.0000 | ORAL_TABLET | Freq: Every day | ORAL | Status: DC
  Administered 2016-04-09 – 2016-04-11 (×3): 2 via ORAL
  Filled 2016-04-09 (×3): qty 2

## 2016-04-09 MED ORDER — SENNOSIDES-DOCUSATE SODIUM 8.6-50 MG PO TABS
2.0000 | ORAL_TABLET | Freq: Every evening | ORAL | Status: DC | PRN
Start: 2016-04-09 — End: 2016-04-11
  Administered 2016-04-09: 2 via ORAL
  Filled 2016-04-09: qty 2

## 2016-04-09 MED ORDER — PRAMIPEXOLE DIHYDROCHLORIDE 0.125 MG PO TABS
0.5000 mg | ORAL_TABLET | Freq: Three times a day (TID) | ORAL | Status: DC
  Administered 2016-04-09 – 2016-04-11 (×8): 0.5 mg via ORAL
  Filled 2016-04-09 (×8): qty 4

## 2016-04-09 MED ORDER — CARBIDOPA-LEVODOPA 25-100 MG PO TABS: 1.5000 | ORAL_TABLET | ORAL | Status: DC

## 2016-04-09 MED ORDER — TAMSULOSIN HCL 0.4 MG PO CAPS
0.4000 mg | ORAL_CAPSULE | Freq: Every day | ORAL | Status: DC
  Administered 2016-04-09 – 2016-04-11 (×3): 0.4 mg via ORAL
  Filled 2016-04-09 (×3): qty 1

## 2016-04-09 MED ORDER — ONDANSETRON HCL 4 MG PO TABS: 4.0000 mg | ORAL_TABLET | Freq: Three times a day (TID) | ORAL | Status: DC | PRN

## 2016-04-09 MED ORDER — OXYCODONE-ACETAMINOPHEN 5-325 MG PO TABS
1.0000 | ORAL_TABLET | Freq: Four times a day (QID) | ORAL | Status: DC | PRN
  Administered 2016-04-09 – 2016-04-10 (×4): 1 via ORAL
  Filled 2016-04-09 (×4): qty 1

## 2016-04-09 MED ORDER — ALPRAZOLAM 0.5 MG PO TABS
1.0000 mg | ORAL_TABLET | Freq: Once | ORAL | Status: AC | PRN
  Administered 2016-04-09: 1 mg via ORAL
  Filled 2016-04-09: qty 2

## 2016-04-09 MED ORDER — CARBIDOPA-LEVODOPA ER 50-200 MG PO TBCR
1.0000 | EXTENDED_RELEASE_TABLET | Freq: Every day | ORAL | Status: DC
  Administered 2016-04-09 – 2016-04-10 (×2): 1 via ORAL
  Filled 2016-04-09 (×4): qty 1

## 2016-04-09 MED ORDER — SODIUM CHLORIDE 0.9% FLUSH
3.0000 mL | Freq: Two times a day (BID) | INTRAVENOUS | Status: DC
  Administered 2016-04-09 – 2016-04-11 (×5): 3 mL via INTRAVENOUS

## 2016-04-09 MED ORDER — STROKE: EARLY STAGES OF RECOVERY BOOK
Freq: Once | Status: AC
  Administered 2016-04-09: 1

## 2016-04-09 MED ORDER — LISINOPRIL 10 MG PO TABS
10.0000 mg | ORAL_TABLET | Freq: Every day | ORAL | Status: DC
  Administered 2016-04-09 – 2016-04-11 (×3): 10 mg via ORAL
  Filled 2016-04-09 (×3): qty 1

## 2016-04-09 MED ORDER — ENOXAPARIN SODIUM 60 MG/0.6ML ~~LOC~~ SOLN
60.0000 mg | SUBCUTANEOUS | Status: DC
  Administered 2016-04-09 – 2016-04-11 (×3): 60 mg via SUBCUTANEOUS
  Filled 2016-04-09 (×3): qty 0.6

## 2016-04-09 MED ORDER — RASAGILINE MESYLATE 1 MG PO TABS
1.0000 mg | ORAL_TABLET | Freq: Every morning | ORAL | Status: DC
  Administered 2016-04-09 – 2016-04-11 (×3): 1 mg via ORAL
  Filled 2016-04-09 (×3): qty 1

## 2016-04-09 MED ORDER — ESCITALOPRAM OXALATE 10 MG PO TABS
10.0000 mg | ORAL_TABLET | Freq: Two times a day (BID) | ORAL | Status: DC
  Administered 2016-04-09 – 2016-04-11 (×5): 10 mg via ORAL
  Filled 2016-04-09 (×6): qty 1

## 2016-04-09 MED ORDER — CARBIDOPA-LEVODOPA 25-100 MG PO TABS
1.5000 | ORAL_TABLET | ORAL | Status: DC
  Administered 2016-04-09 – 2016-04-11 (×9): 1.5 via ORAL
  Filled 2016-04-09 (×6): qty 2
  Filled 2016-04-09 (×2): qty 1
  Filled 2016-04-09: qty 2

## 2016-04-09 MED ORDER — CLOPIDOGREL BISULFATE 75 MG PO TABS
75.0000 mg | ORAL_TABLET | Freq: Every day | ORAL | Status: DC
  Administered 2016-04-09 – 2016-04-11 (×3): 75 mg via ORAL
  Filled 2016-04-09 (×3): qty 1

## 2016-04-09 MED ORDER — ASPIRIN EC 81 MG PO TBEC: 81.0000 mg | DELAYED_RELEASE_TABLET | Freq: Every morning | ORAL | Status: DC

## 2016-04-09 MED ORDER — ATORVASTATIN CALCIUM 10 MG PO TABS
20.0000 mg | ORAL_TABLET | Freq: Every day | ORAL | Status: DC
  Administered 2016-04-10 – 2016-04-11 (×2): 20 mg via ORAL
  Filled 2016-04-09 (×2): qty 2

## 2016-04-09 NOTE — Evaluation (Signed)
Physical Therapy Evaluation Patient Details Name: James Schultz MRN: [id number] DOB: 01/23/1951 Today's Date: 04/09/2016   History of Present Illness  65 y.o. male with medical history significant of anxiety, depression, hypertension, headache, osteoarthritis, obstructive sleep apnea on CPAP, Parkinson's disease, urolithiasis, spinal stenosis, urinary incontinence secondary to Parkinson's disease who was brought to the emergency department be a private vehicle after he passed out on the passenger's seat, developed a left facial droop while parked at [employer] parking lot, witnessed by his wife who was in the driver's seat. MRI on 8/26 + for acute RIGHT pontine infarct.   Clinical Impression  Patient demonstrates deficits in functional mobility as indicated below. Will need continued skilled PT to address deficits and maixmize function. Will see as indicated and progress as tolerated. At this time, patient will need post acute rehabilitation, feel that CIR would be most appropriate venue.    Follow Up Recommendations CIR;Supervision/Assistance - 24 hour    Equipment Recommendations  Other (comment) (TBD)    Recommendations for Other Services Rehab consult     Precautions / Restrictions Precautions Precautions: Fall Restrictions Weight Bearing Restrictions: No      Mobility  Bed Mobility Overal bed mobility: Needs Assistance;+2 for physical assistance Bed Mobility: Rolling;Sidelying to Sit;Sit to Sidelying Rolling: Mod assist;+2 for physical assistance Sidelying to sit: Max assist;+2 for physical assistance     Sit to sidelying: Max assist;+2 for physical assistance General bed mobility comments: +2 maximal assist for bed mobility, increased physical assist for rolling both directions to sidyelying (hygiene and pericare performed) then +2 max assist to elevate to upright at EOB. Patient attempting the use of UEs to assist.  Transfers Overall transfer level: Needs  assistance Equipment used: 2 person hand held assist (face to face with gait belt ) Transfers: Sit to/from Stand Sit to Stand: Mod assist;+2 physical assistance;From elevated surface         General transfer comment: Patient required VCs for positioning and technique. increased time to perform. Require rocker technique due to PD deficits. moderate assist to power up and maintain stability  Ambulation/Gait Ambulation/Gait assistance: Max assist;+2 physical assistance Ambulation Distance (Feet): 6 Feet Assistive device: 2 person hand held assist (face to face with wrap around via gait belt) Gait Pattern/deviations: Step-to pattern;Shuffle Gait velocity: decreased Gait velocity interpretation: <1.8 ft/sec, indicative of risk for recurrent falls General Gait Details: patient required rocker step and max multi modal cues to initate step. Posterior list present. Patient with increased difficulty weight shifting requiring manual faciliatation.   Stairs            Wheelchair Mobility    Modified Rankin (Stroke Patients Only) Modified Rankin (Stroke Patients Only) Pre-Morbid Rankin Score: No symptoms Modified Rankin: Severe disability     Balance Overall balance assessment: History of Falls                                           Pertinent Vitals/Pain Pain Assessment: Faces Faces Pain Scale: Hurts little more Pain Descriptors / Indicators: Grimacing Pain Intervention(s): Monitored during session    Home Living Family/patient expects to be discharged to:: Private residence Living Arrangements: Spouse/significant other Available Help at Discharge: Family;Available 24 hours/day Type of Home: House Home Access: Ramped entrance (+1 step into kitchen)     Home Layout: One level Home Equipment: Shower seat;Grab bars - tub/shower;Walker - 2 wheels  Prior Function Level of Independence: Needs assistance   Gait / Transfers Assistance Needed: RW for  mobility but limited. Wife reports pt spends most of his time in a recliner.  ADL's / Homemaking Assistance Needed: Wife assists with LB ADL and fine motor tasks (buttons, etc). Pt does not do IADL.        Hand Dominance        Extremity/Trunk Assessment   Upper Extremity Assessment: RUE deficits/detail;LUE deficits/detail RUE Deficits / Details: Strength, AROM, sensation WFL.     LUE Deficits / Details: Strength, AROM, sensation WFL.   Lower Extremity Assessment: Generalized weakness;RLE deficits/detail;LLE deficits/detail RLE Deficits / Details: generalized weakness noted BLE LLE Deficits / Details: generalized weakness noted BLE  Cervical / Trunk Assessment: Normal  Communication   Communication: No difficulties  Cognition Arousal/Alertness: Awake/alert Behavior During Therapy: Flat affect Overall Cognitive Status: Impaired/Different from baseline Area of Impairment: Following commands;Safety/judgement;Problem solving       Following Commands: Follows one step commands consistently;Follows one step commands with increased time Safety/Judgement: Decreased awareness of safety;Decreased awareness of deficits   Problem Solving: Slow processing;Decreased initiation;Requires verbal cues General Comments: Pt with flat affect and slow processing during activities. Wife reports pt has been slightly agitated; pt telling her to "shut up" which he reports he never says.    General Comments General comments (skin integrity, edema, etc.): patient with incontinence    Exercises        Assessment/Plan    PT Assessment Patient needs continued PT services  PT Diagnosis Difficulty walking;Abnormality of gait;Generalized weakness;Altered mental status   PT Problem List Decreased strength;Decreased activity tolerance;Decreased balance;Decreased mobility;Decreased coordination;Decreased cognition;Decreased safety awareness;Obesity;Pain  PT Treatment Interventions DME  instruction;Gait training;Functional mobility training;Therapeutic activities;Therapeutic exercise;Balance training;Neuromuscular re-education;Cognitive remediation;Patient/family education   PT Goals (Current goals can be found in the Care Plan section) Acute Rehab PT Goals Patient Stated Goal: to make functional improvements PT Goal Formulation: With patient/family Time For Goal Achievement: 04/23/16 Potential to Achieve Goals: Good    Frequency Min 3X/week   Barriers to discharge        Co-evaluation PT/OT/SLP Co-Evaluation/Treatment: Yes Reason for Co-Treatment: Complexity of the patient's impairments (multi-system involvement);For patient/therapist safety PT goals addressed during session: Mobility/safety with mobility;Balance OT goals addressed during session: ADL's and self-care       End of Session Equipment Utilized During Treatment: Gait belt Activity Tolerance: Patient limited by fatigue Patient left: in bed;with call bell/phone within reach;with nursing/sitter in room;with family/visitor present Nurse Communication: Mobility status         Time: [id number] PT Time Calculation (min) (ACUTE ONLY): 22 min   Charges:   PT Evaluation $PT Eval Moderate Complexity: 1 Procedure     PT G CodesDuncan Dull April 30, 2016, 1:30 PM Alben Deeds, [place] DPT  [phone]

## 2016-04-09 NOTE — Progress Notes (Addendum)
3:02 PM Brought patient to lab for Carotid Duplex and Echo. Patient complaining of back pain, and during echo requested testing be stopped. Only limited echo images were obtained. Attempted to begin Carotid Duplex, but patient refusing.   Ivar Drape, RN notified by Mikki Santee.   Landry Mellow, RDMS, RVT 04/09/2016

## 2016-04-09 NOTE — Progress Notes (Signed)
PT Cancellation Note  Patient Details Name: James Schultz MRN: [id number] DOB: Feb 08, 1951   Cancelled Treatment:    Reason Eval/Treat Not Completed: Patient not medically ready (requesting that therapy wait to Eval until PD meds on board ) will re-attempt later this am.    Duncan Dull 04/09/2016, 8:20 AM Alben Deeds, PT DPT  [phone]

## 2016-04-09 NOTE — Progress Notes (Signed)
Rehab Admissions Coordinator Note:  Patient was screened by Cleatrice Burke for appropriateness for an Inpatient Acute Rehab Consult per PT and OT recommendations. At this time, we are recommending Inpatient Rehab consult.  Cleatrice Burke 04/09/2016, 4:24 PM  I can be reached at [phone].

## 2016-04-09 NOTE — Evaluation (Signed)
Occupational Therapy Evaluation Patient Details Name: James Schultz MRN: [id number] DOB: 02-24-1951 Today's Date: 04/09/2016    History of Present Illness 65 y.o. male with medical history significant of anxiety, depression, hypertension, headache, osteoarthritis, obstructive sleep apnea on CPAP, Parkinson's disease, urolithiasis, spinal stenosis, urinary incontinence secondary to Parkinson's disease who was brought to the emergency department be a private vehicle after he passed out on the passenger's seat, developed a left facial droop while parked at [employer] parking lot, witnessed by his wife who was in the driver's seat. MRI on 8/26 + for acute RIGHT pontine infarct.    Clinical Impression   Pts wife reports he required assist for LB ADL and fine motor tasks PTA. Currently pt requires max assist +2 for basic transfers, min assist for UB ADL in sitting, and max assist for LB ADL. Pt presenting with impaired cognition and decreased sitting/standing balance impacting his independence and safety with ADL and functional mobility. Recommending CIR level therapies for follow up to maximize independence and safety with ADL and functional mobility prior to return home. Pt would benefit from continued skilled OT to address established goals.    Follow Up Recommendations  CIR;Supervision/Assistance - 24 hour    Equipment Recommendations  Other (comment) (TBD at next venue)    Recommendations for Other Services Rehab consult     Precautions / Restrictions Precautions Precautions: Fall Restrictions Weight Bearing Restrictions: No      Mobility Bed Mobility Overal bed mobility: Needs Assistance;+2 for physical assistance Bed Mobility: Rolling;Sidelying to Sit;Sit to Sidelying Rolling: Mod assist;+2 for physical assistance Sidelying to sit: Max assist;+2 for physical assistance     Sit to sidelying: Max assist;+2 for physical assistance General bed mobility comments: +2 maximal  assist for bed mobility, increased physical assist for rolling both directions to sidyelying (hygiene and pericare performed) then +2 max assist to elevate to upright at EOB. Patient attempting the use of UEs to assist.  Transfers Overall transfer level: Needs assistance Equipment used: 2 person hand held assist (face to face with gait belt ) Transfers: Sit to/from Stand Sit to Stand: Mod assist;+2 physical assistance;From elevated surface         General transfer comment: Patient required VCs for positioning and technique. increased time to perform. Require rocker technique due to PD deficits. moderate assist to power up and maintain stability    Balance Overall balance assessment: History of Falls                                          ADL Overall ADL's : Needs assistance/impaired     Grooming: Min guard;Sitting;Cueing for safety;Cueing for sequencing   Upper Body Bathing: Minimal assitance;Sitting   Lower Body Bathing: Maximal assistance;Sit to/from stand;+2 for physical assistance   Upper Body Dressing : Minimal assistance;Sitting   Lower Body Dressing: Maximal assistance;+2 for physical assistance;Sit to/from Health and safety inspector Details (indicate cue type and reason): bed level at this time Toileting- Water quality scientist and Hygiene: Total assistance;Bed level;+2 for physical assistance       Functional mobility during ADLs: Maximal assistance;+2 for physical assistance (for sit to stand only) General ADL Comments: Pt minimally interactive this session. Assisted RN with bed mobility during in and out cath and cleaning pt after BM.     Vision Additional Comments: Difficult to assess due to impaired cognition. Needs further assessment.  Perception     Praxis      Pertinent Vitals/Pain Pain Assessment: Faces Faces Pain Scale: Hurts little more Pain Descriptors / Indicators: Grimacing Pain Intervention(s): Monitored during session      Hand Dominance     Extremity/Trunk Assessment Upper Extremity Assessment Upper Extremity Assessment: RUE deficits/detail;LUE deficits/detail RUE Deficits / Details: Strength, AROM, sensation WFL. RUE Coordination: decreased fine motor LUE Deficits / Details: Strength, AROM, sensation WFL. LUE Coordination: decreased fine motor   Lower Extremity Assessment Defer to PT eval   Cervical / Trunk Assessment Cervical / Trunk Assessment: Normal   Communication Communication Communication: No difficulties   Cognition Arousal/Alertness: Awake/alert Behavior During Therapy: Flat affect Overall Cognitive Status: Impaired/Different from baseline Area of Impairment: Following commands;Safety/judgement;Problem solving       Following Commands: Follows one step commands consistently;Follows one step commands with increased time Safety/Judgement: Decreased awareness of safety;Decreased awareness of deficits   Problem Solving: Slow processing;Decreased initiation;Requires verbal cues General Comments: Pt with flat affect and slow processing during activities. Wife reports pt has been slightly agitated; pt telling her to "shut up" which he reports he never says.   General Comments       Exercises       Shoulder Instructions      Home Living Family/patient expects to be discharged to:: Private residence Living Arrangements: Spouse/significant other Available Help at Discharge: Family;Available 24 hours/day Type of Home: House Home Access: Ramped entrance (+1 step into kitchen)     Home Layout: One level     Bathroom Shower/Tub: Occupational psychologist: Handicapped height     Home Equipment: Shower seat;Grab bars - tub/shower;Walker - 2 wheels          Prior Functioning/Environment Level of Independence: Needs assistance  Gait / Transfers Assistance Needed: RW for mobility but limited. Wife reports pt spends most of his time in a recliner. ADL's / Homemaking  Assistance Needed: Wife assists with LB ADL and fine motor tasks (buttons, etc). Pt does not do IADL.        OT Diagnosis: Cognitive deficits;Generalized weakness;Acute pain;Altered mental status   OT Problem List: Decreased strength;Decreased range of motion;Decreased activity tolerance;Impaired balance (sitting and/or standing);Decreased coordination;Decreased cognition;Decreased safety awareness;Decreased knowledge of use of DME or AE;Decreased knowledge of precautions;Obesity;Pain   OT Treatment/Interventions: Self-care/ADL training;Neuromuscular education;Energy conservation;DME and/or AE instruction;Therapeutic activities;Cognitive remediation/compensation;Patient/family education;Balance training    OT Goals(Current goals can be found in the care plan section) Acute Rehab OT Goals Patient Stated Goal: to make functional improvements OT Goal Formulation: With patient/family Time For Goal Achievement: 04/23/16 Potential to Achieve Goals: Good ADL Goals Pt Will Perform Grooming: with set-up;sitting;with supervision Pt Will Perform Upper Body Bathing: with set-up;sitting;with supervision Pt Will Transfer to Toilet: with min assist;ambulating;bedside commode Pt Will Perform Toileting - Clothing Manipulation and hygiene: sit to/from stand;with min guard assist  OT Frequency: Min 2X/week   Barriers to D/C:            Co-evaluation PT/OT/SLP Co-Evaluation/Treatment: Yes Reason for Co-Treatment: Complexity of the patient's impairments (multi-system involvement);For patient/therapist safety PT goals addressed during session: Mobility/safety with mobility;Balance OT goals addressed during session: ADL's and self-care      End of Session Nurse Communication: Mobility status (RN present and assisting)  Activity Tolerance: Patient tolerated treatment well Patient left: in bed;with call bell/phone within reach;with bed alarm set;with family/visitor present;with nursing/sitter in room    Time: 1048-1110 OT Time Calculation (min): 22 min Charges:  OT General Charges $OT Visit:  1 Procedure OT Evaluation $OT Eval Moderate Complexity: 1 Procedure G-Codes: OT G-codes **NOT FOR INPATIENT CLASS** Functional Assessment Tool Used: Clinical judgement Functional Limitation: Self care Self Care Current Status [id number]): At least 60 percent but less than 80 percent impaired, limited or restricted Self Care Goal Status [id number]): At least 20 percent but less than 40 percent impaired, limited or restricted   Binnie Kand M.S., OTR/L Pager: [phone]  04/09/2016, 1:36 PM

## 2016-04-09 NOTE — Progress Notes (Signed)
Pt had pulled out IV and had another bowel movement. Pt cleaned up. Tech offered to feed pt. Pt stating he does not want to eat at this time.

## 2016-04-09 NOTE — Progress Notes (Signed)
rn able to  In and out cath pt with assists from staff. rn and tech changing pts bedsheets with PT and OT assistance. Pt very regularly dripping stool from rectum. Changed sheets, then changed bed pads at least 3 times. All linen clean and pt back in bed.

## 2016-04-09 NOTE — Progress Notes (Signed)
OT Cancellation Note  Patient Details Name: James Schultz MRN: [id number] DOB: 1951-05-28   Cancelled Treatment:    Reason Eval/Treat Not Completed: Patient not medically ready. Will follow up for OT eval later this AM once PD meds on board.  Binnie Kand M.S., OTR/L Pager: [phone]  04/09/2016, 8:33 AM
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
## 2016-04-09 NOTE — Progress Notes (Addendum)
Neuro md paged about pts blood pressure  Per neuro md, permissive HTN to 220/110

## 2016-04-09 NOTE — Progress Notes (Signed)
Pt has been agitated this morning. Tried to get out of bed. rn and tech allowed pt to sit on side of bed so that pt could use urinal. Pt had already urinated on himself. md aware. Will get in and out cath.  Wife says pt has been agitated and saying things like "shut up" which is not typical of him.

## 2016-04-09 NOTE — Progress Notes (Addendum)
Pt refused echo and carotid saying his back hurt. Neuro md made aware.   Pt back to floor.

## 2016-04-09 NOTE — Progress Notes (Signed)
STROKE TEAM PROGRESS NOTE   HISTORY OF PRESENT ILLNESS (per record) James Schultz is an 65 y.o. male who presented to the emergency room after an episode of loss of consciousness in his car. His wife reports that they were shopping at [employer] and that he was resting in the car. When she came back from her shopping she noted that he seemed poorly responsive. She noted there was some drooling from his mouth. She immediately brought him to the emergency room. Was immediately taken to CT which was normal. Upon evaluation after the CT scan he no neurological deficits. His wife reports he has recently been treated for significantly labile hypertension. He also has a known history of Parkinson's disease. She reports he has been undergoing recent changes in his blood pressure medications.   SUBJECTIVE (INTERVAL HISTORY) No family is at the bedside.  Overall he feels his condition is stable. He has PD on sinemet, still has significant bradykinesia and rigidity. Not fully orientated.    OBJECTIVE Temp:  [98.4 F (36.9 C)-98.8 F (37.1 C)] 98.4 F (36.9 C) (08/26 0300) Pulse Rate:  [64-84] 84 (08/26 0300) Cardiac Rhythm: Normal sinus rhythm (08/25 2255) Resp:  [15-20] 18 (08/26 0300) BP: (98-167)/(55-88) 167/88 (08/26 0300) SpO2:  [95 %-100 %] 96 % (08/26 0300) Weight:  [123.9 kg (273 lb 2.4 oz)] 123.9 kg (273 lb 2.4 oz) (08/25 1722)  CBC:  Recent Labs Lab 04/08/16 1758 04/09/16 0603  WBC 8.7 12.1*  NEUTROABS 5.2  --   HGB 13.7 13.4  HCT 41.8 41.8  MCV 86.4 86.4  PLT 172 [id number]    Basic Metabolic Panel:  Recent Labs Lab 04/08/16 1750 04/08/16 1758  NA 143 141  K 3.7 3.8  CL 106 106  CO2  --  25  GLUCOSE 119* 118*  BUN 19 15  CREATININE 0.90 1.03  CALCIUM  --  9.6    Lipid Panel: No results found for: CHOL, TRIG, HDL, CHOLHDL, VLDL, LDLCALC HgbA1c: No results found for: HGBA1C Urine Drug Screen: No results found for: LABOPIA, COCAINSCRNUR, LABBENZ, AMPHETMU, THCU, LABBARB     IMAGING I have personally reviewed the radiological images below and agree with the radiology interpretations.  Dg Chest 1 View 04/09/2016 Pulmonary vascular congestion Hypoventilation   Mr Jodene Nam Head/brain Wo Cm 04/09/2016  MRI HEAD:  14 x 16 mm acute RIGHT pontine infarct. Mild global parenchymal brain volume loss for age. Mild chronic small vessel ischemic disease.   MRA HEAD:  No emergent large vessel occlusion or high-grade stenosis. Dolichoectasia compatible chronic hypertension.   Ct Head Code Stroke Wo Contrast 04/08/2016 1. Generalized atrophy.  No acute infarct.  2. ASPECTS is 10 3. Air-fluid level sphenoid sinus.   CUS - not cooperative  TTE - not cooperative   PHYSICAL EXAM  Temp:  [98.4 F (36.9 C)-99 F (37.2 C)] 99 F (37.2 C) (08/26 2143) Pulse Rate:  [78-93] 82 (08/26 2143) Resp:  [15-18] 18 (08/26 2143) BP: (150-188)/(75-106) 179/89 (08/26 2143) SpO2:  [93 %-99 %] 99 % (08/26 2143)  General - Well nourished, well developed, in no apparent distress.  Ophthalmologic - Fundi not visualized due to noncooperative.  Cardiovascular - Regular rate and rhythm.  Mental Status -  Awake alert but not orientated to place, year or age. Paucity of speech, masked face, hypophonic and dysarthria, able to name and repeat but bradyphasia.  Cranial Nerves II - XII - II - Visual field intact OU. III, IV, VI - Extraocular movements intact.  V - Facial sensation intact bilaterally. VII - Facial movement intact bilaterally, but masked face. VIII - Hearing & vestibular intact bilaterally. X - Palate elevates symmetrically, mild dysarthria. XI - Chin turning & shoulder shrug intact bilaterally. XII - Tongue protrusion intact.  Motor Strength - The patient's strength was 4/5 in all extremities although slow movement and pronator drift was absent.  Bulk was normal and fasciculations were absent.   Motor Tone - Muscle tone was assessed at the neck and appendages and was  increased throughout  Reflexes - The patient's reflexes were 1+ in all extremities and he had no pathological reflexes.  Sensory - Light touch, temperature/pinprick were assessed and were symmetrical.    Coordination - The patient had normal movements in the hands with no ataxia or dysmetria.  Tremor was absent, mild rigidity with cogwelling, R>L.  Gait and Station - not tested due to safety concerns.   ASSESSMENT/PLAN Mr. ARTAVIS GUZZARDO is a 65 y.o. male with history of spinal stenosis, Parkinson's disease, obstructive sleep apnea,labile hypertension, headaches, and anxiety presenting with a decreased level of consciousness. He did not receive IV t-PA due to minimal deficits.   Stroke:  Right pontine infarct secondary to small vessel disease.  Resultant  No significant deficit  MRI  14 x 16 mm acute RIGHT pontine infarct.  MRA - no emergent large vessel occlusion or high-grade stenosis. Dolichoectasia compatible chronic hypertension.   Carotid Doppler - pending (patient declined Dopplers today secondary to back pain)  2D Echo - pending (incomplete study secondary to back pain)  LDL - 102  HgbA1c pending  VTE prophylaxis - Lovenox  Diet Heart Room service appropriate? Yes; Fluid consistency: Thin  aspirin 81 mg daily prior to admission, now on clopidogrel 75 mg daily. Continue plavix on discharge.   Patient counseled to be compliant with his antithrombotic medications  Ongoing aggressive stroke risk factor management  Therapy recommendations: Inpatient rehabilitation recommended.  Disposition: pending  PD  Exam typical for PD  On sinement, mirapex and azilect  Continue current home meds  Hypertension  Blood pressure tends to run high (currently on lisinopril 10 mg daily)  Permissive hypertension (OK if < 220/120) but gradually normalize in 5-7 days  Long-term BP goal normotensive  Hyperlipidemia  Home meds:  No lipid lowering medications prior to  admission.  LDL 102, goal < 70  Add Lipitor 20 mg daily  Continue statin at discharge  Other Stroke Risk Factors  Advanced age  Obesity, Body mass index is 39.19 kg/m., recommend weight loss, diet and exercise as appropriate   Other Active Problems  Mild leukocytosis - 12.1 - afebrile  Hospital day # 0  Neurology will sign off. Please call with questions. Pt will follow up with carolyn Hassell Done NP at [facility] in about 2 months. Thanks for the consult.   Rosalin Hawking, MD PhD Stroke Neurology 04/09/2016 11:51 PM    To contact Stroke Continuity provider, please refer to [email or web address] After hours, contact General Neurology

## 2016-04-09 NOTE — Progress Notes (Signed)
Pt +2 assist from chair to bed. Almost needed lift to transfer pt. Wife reports pt normally takes parkinsons medicines at 0600 am

## 2016-04-10 LAB — HEMOGLOBIN A1C
HEMOGLOBIN A1C: 5.5 % (ref 4.8–5.6)
MEAN PLASMA GLUCOSE: 111 mg/dL
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
## 2016-04-11 ENCOUNTER — Inpatient Hospital Stay (HOSPITAL_COMMUNITY)
Admission: RE | Admit: 2016-04-11 | Discharge: 2016-04-20 | DRG: 057 | Disposition: A | Discharge status: Home Health | Payer: Medicare Other | Source: Intra-hospital | Attending: Physical Medicine & Rehabilitation | Admitting: Physical Medicine & Rehabilitation

## 2016-04-11 ENCOUNTER — Inpatient Hospital Stay (HOSPITAL_COMMUNITY): Payer: Medicare Other

## 2016-04-11 ENCOUNTER — Encounter (HOSPITAL_COMMUNITY): Payer: Self-pay | Admitting: *Deleted

## 2016-04-11 DIAGNOSIS — I69318 Other symptoms and signs involving cognitive functions following cerebral infarction: Secondary | ICD-10-CM | POA: Diagnosis not present

## 2016-04-11 DIAGNOSIS — I63542 Cerebral infarction due to unspecified occlusion or stenosis of left cerebellar artery: Secondary | ICD-10-CM

## 2016-04-11 DIAGNOSIS — Z79899 Other long term (current) drug therapy: Secondary | ICD-10-CM

## 2016-04-11 DIAGNOSIS — D72829 Elevated white blood cell count, unspecified: Secondary | ICD-10-CM | POA: Diagnosis present

## 2016-04-11 DIAGNOSIS — I69398 Other sequelae of cerebral infarction: Secondary | ICD-10-CM | POA: Diagnosis not present

## 2016-04-11 DIAGNOSIS — Z09 Encounter for follow-up examination after completed treatment for conditions other than malignant neoplasm: Secondary | ICD-10-CM | POA: Diagnosis not present

## 2016-04-11 DIAGNOSIS — M5030 Other cervical disc degeneration, unspecified cervical region: Secondary | ICD-10-CM | POA: Diagnosis not present

## 2016-04-11 DIAGNOSIS — I10 Essential (primary) hypertension: Secondary | ICD-10-CM | POA: Diagnosis present

## 2016-04-11 DIAGNOSIS — I6350 Cerebral infarction due to unspecified occlusion or stenosis of unspecified cerebral artery: Secondary | ICD-10-CM | POA: Diagnosis not present

## 2016-04-11 DIAGNOSIS — E785 Hyperlipidemia, unspecified: Secondary | ICD-10-CM | POA: Diagnosis present

## 2016-04-11 DIAGNOSIS — Z7902 Long term (current) use of antithrombotics/antiplatelets: Secondary | ICD-10-CM | POA: Diagnosis not present

## 2016-04-11 DIAGNOSIS — I6789 Other cerebrovascular disease: Secondary | ICD-10-CM

## 2016-04-11 DIAGNOSIS — F329 Major depressive disorder, single episode, unspecified: Secondary | ICD-10-CM | POA: Diagnosis present

## 2016-04-11 DIAGNOSIS — Z299 Encounter for prophylactic measures, unspecified: Secondary | ICD-10-CM | POA: Diagnosis not present

## 2016-04-11 DIAGNOSIS — M1612 Unilateral primary osteoarthritis, left hip: Secondary | ICD-10-CM

## 2016-04-11 DIAGNOSIS — G4733 Obstructive sleep apnea (adult) (pediatric): Secondary | ICD-10-CM | POA: Diagnosis present

## 2016-04-11 DIAGNOSIS — Z5189 Encounter for other specified aftercare: Secondary | ICD-10-CM | POA: Diagnosis not present

## 2016-04-11 DIAGNOSIS — M47812 Spondylosis without myelopathy or radiculopathy, cervical region: Secondary | ICD-10-CM | POA: Diagnosis present

## 2016-04-11 DIAGNOSIS — R410 Disorientation, unspecified: Secondary | ICD-10-CM

## 2016-04-11 DIAGNOSIS — I69319 Unspecified symptoms and signs involving cognitive functions following cerebral infarction: Secondary | ICD-10-CM | POA: Diagnosis not present

## 2016-04-11 DIAGNOSIS — G20 Parkinson's disease: Secondary | ICD-10-CM

## 2016-04-11 DIAGNOSIS — G4730 Sleep apnea, unspecified: Secondary | ICD-10-CM

## 2016-04-11 DIAGNOSIS — G459 Transient cerebral ischemic attack, unspecified: Secondary | ICD-10-CM

## 2016-04-11 DIAGNOSIS — E669 Obesity, unspecified: Secondary | ICD-10-CM | POA: Diagnosis not present

## 2016-04-11 DIAGNOSIS — Z96652 Presence of left artificial knee joint: Secondary | ICD-10-CM | POA: Diagnosis present

## 2016-04-11 DIAGNOSIS — F419 Anxiety disorder, unspecified: Secondary | ICD-10-CM | POA: Diagnosis present

## 2016-04-11 DIAGNOSIS — Z96653 Presence of artificial knee joint, bilateral: Secondary | ICD-10-CM | POA: Diagnosis not present

## 2016-04-11 DIAGNOSIS — N400 Enlarged prostate without lower urinary tract symptoms: Secondary | ICD-10-CM | POA: Diagnosis present

## 2016-04-11 DIAGNOSIS — Z9981 Dependence on supplemental oxygen: Secondary | ICD-10-CM | POA: Diagnosis not present

## 2016-04-11 DIAGNOSIS — Z7982 Long term (current) use of aspirin: Secondary | ICD-10-CM

## 2016-04-11 DIAGNOSIS — R32 Unspecified urinary incontinence: Secondary | ICD-10-CM | POA: Diagnosis not present

## 2016-04-11 DIAGNOSIS — I639 Cerebral infarction, unspecified: Secondary | ICD-10-CM | POA: Diagnosis not present

## 2016-04-11 DIAGNOSIS — I350 Nonrheumatic aortic (valve) stenosis: Secondary | ICD-10-CM | POA: Diagnosis not present

## 2016-04-11 DIAGNOSIS — M1990 Unspecified osteoarthritis, unspecified site: Secondary | ICD-10-CM | POA: Diagnosis not present

## 2016-04-11 DIAGNOSIS — F4323 Adjustment disorder with mixed anxiety and depressed mood: Secondary | ICD-10-CM

## 2016-04-11 DIAGNOSIS — Z9181 History of falling: Secondary | ICD-10-CM | POA: Diagnosis not present

## 2016-04-11 DIAGNOSIS — E876 Hypokalemia: Secondary | ICD-10-CM

## 2016-04-11 DIAGNOSIS — R51 Headache: Secondary | ICD-10-CM | POA: Diagnosis not present

## 2016-04-11 DIAGNOSIS — M47892 Other spondylosis, cervical region: Secondary | ICD-10-CM | POA: Diagnosis not present

## 2016-04-11 DIAGNOSIS — Z8673 Personal history of transient ischemic attack (TIA), and cerebral infarction without residual deficits: Secondary | ICD-10-CM | POA: Diagnosis not present

## 2016-04-11 DIAGNOSIS — I69354 Hemiplegia and hemiparesis following cerebral infarction affecting left non-dominant side: Principal | ICD-10-CM

## 2016-04-11 LAB — VAS US CAROTID
LCCAPSYS: 91 cm/s
LEFT ECA DIAS: -15 cm/s
LEFT VERTEBRAL DIAS: 13 cm/s
LICADDIAS: -18 cm/s
LICAPDIAS: -18 cm/s
LICAPSYS: -55 cm/s
Left CCA dist dias: -12 cm/s
Left CCA dist sys: -64 cm/s
Left CCA prox dias: 10 cm/s
Left ICA dist sys: -74 cm/s
RCCAPDIAS: 16 cm/s
RIGHT ECA DIAS: -15 cm/s
RIGHT VERTEBRAL DIAS: 17 cm/s
Right CCA prox sys: 82 cm/s
Right cca dist sys: -62 cm/s

## 2016-04-11 LAB — ECHOCARDIOGRAM COMPLETE: Weight: 4370.4 oz

## 2016-04-11 MED ORDER — ENOXAPARIN SODIUM 60 MG/0.6ML ~~LOC~~ SOLN
60.0000 mg | SUBCUTANEOUS | Status: DC
  Administered 2016-04-12 – 2016-04-20 (×9): 60 mg via SUBCUTANEOUS
  Filled 2016-04-11 (×9): qty 0.6

## 2016-04-11 MED ORDER — OXYCODONE-ACETAMINOPHEN 5-325 MG PO TABS
1.0000 | ORAL_TABLET | Freq: Four times a day (QID) | ORAL | Status: DC | PRN
  Administered 2016-04-15 – 2016-04-19 (×4): 1 via ORAL
  Filled 2016-04-11 (×5): qty 1

## 2016-04-11 MED ORDER — ONDANSETRON HCL 4 MG/2ML IJ SOLN: 4.0000 mg | Freq: Four times a day (QID) | INTRAMUSCULAR | Status: DC | PRN

## 2016-04-11 MED ORDER — CARBIDOPA-LEVODOPA 25-100 MG PO TABS
2.0000 | ORAL_TABLET | Freq: Every day | ORAL | Status: DC
  Administered 2016-04-12 – 2016-04-20 (×9): 2 via ORAL
  Filled 2016-04-11 (×7): qty 2
  Filled 2016-04-11: qty 1
  Filled 2016-04-11: qty 2

## 2016-04-11 MED ORDER — RASAGILINE MESYLATE 1 MG PO TABS
1.0000 mg | ORAL_TABLET | Freq: Every morning | ORAL | Status: DC
  Administered 2016-04-12 – 2016-04-20 (×9): 1 mg via ORAL
  Filled 2016-04-11 (×9): qty 1

## 2016-04-11 MED ORDER — ATORVASTATIN CALCIUM 20 MG PO TABS
20.0000 mg | ORAL_TABLET | Freq: Every day | ORAL | Status: DC
  Administered 2016-04-12 – 2016-04-19 (×8): 20 mg via ORAL
  Filled 2016-04-11 (×8): qty 1

## 2016-04-11 MED ORDER — SENNOSIDES-DOCUSATE SODIUM 8.6-50 MG PO TABS: 2.0000 | ORAL_TABLET | Freq: Every evening | ORAL | Status: DC | PRN

## 2016-04-11 MED ORDER — PRAMIPEXOLE DIHYDROCHLORIDE 1 MG PO TABS: 0.5000 mg | ORAL_TABLET | Freq: Three times a day (TID) | ORAL | Status: DC

## 2016-04-11 MED ORDER — TAMSULOSIN HCL 0.4 MG PO CAPS
0.4000 mg | ORAL_CAPSULE | Freq: Every day | ORAL | Status: DC
  Administered 2016-04-12 – 2016-04-20 (×9): 0.4 mg via ORAL
  Filled 2016-04-11 (×9): qty 1

## 2016-04-11 MED ORDER — SORBITOL 70 % SOLN: 30.0000 mL | Freq: Every day | Status: DC | PRN

## 2016-04-11 MED ORDER — CLOPIDOGREL BISULFATE 75 MG PO TABS: 75.0000 mg | ORAL_TABLET | Freq: Every day | ORAL | Status: DC

## 2016-04-11 MED ORDER — CARBIDOPA-LEVODOPA 25-100 MG PO TABS
1.5000 | ORAL_TABLET | ORAL | Status: DC
Start: 2016-04-11 — End: 2016-05-16

## 2016-04-11 MED ORDER — ONDANSETRON HCL 4 MG PO TABS: 4.0000 mg | ORAL_TABLET | Freq: Four times a day (QID) | ORAL | Status: DC | PRN

## 2016-04-11 MED ORDER — ACETAMINOPHEN 325 MG PO TABS
325.0000 mg | ORAL_TABLET | ORAL | Status: DC | PRN
  Administered 2016-04-12: 650 mg via ORAL
  Filled 2016-04-11: qty 2

## 2016-04-11 MED ORDER — PRAMIPEXOLE DIHYDROCHLORIDE 0.25 MG PO TABS
0.5000 mg | ORAL_TABLET | Freq: Three times a day (TID) | ORAL | Status: DC
  Administered 2016-04-12 – 2016-04-20 (×26): 0.5 mg via ORAL
  Filled 2016-04-11 (×28): qty 2

## 2016-04-11 MED ORDER — ATORVASTATIN CALCIUM 20 MG PO TABS: 20.0000 mg | ORAL_TABLET | Freq: Every day | ORAL | Status: DC

## 2016-04-11 MED ORDER — ESCITALOPRAM OXALATE 10 MG PO TABS
10.0000 mg | ORAL_TABLET | Freq: Two times a day (BID) | ORAL | Status: DC
  Administered 2016-04-11 – 2016-04-14 (×7): 10 mg via ORAL
  Filled 2016-04-11 (×7): qty 1

## 2016-04-11 MED ORDER — ENOXAPARIN SODIUM 60 MG/0.6ML ~~LOC~~ SOLN: 60.0000 mg | SUBCUTANEOUS | Status: DC

## 2016-04-11 MED ORDER — LISINOPRIL 10 MG PO TABS
10.0000 mg | ORAL_TABLET | Freq: Every day | ORAL | Status: DC
  Administered 2016-04-12 – 2016-04-20 (×9): 10 mg via ORAL
  Filled 2016-04-11 (×9): qty 1

## 2016-04-11 MED ORDER — CLOPIDOGREL BISULFATE 75 MG PO TABS
75.0000 mg | ORAL_TABLET | Freq: Every day | ORAL | Status: DC
  Administered 2016-04-12 – 2016-04-20 (×9): 75 mg via ORAL
  Filled 2016-04-11 (×9): qty 1

## 2016-04-11 MED ORDER — CARBIDOPA-LEVODOPA ER 50-200 MG PO TBCR
1.0000 | EXTENDED_RELEASE_TABLET | Freq: Every day | ORAL | Status: DC
  Administered 2016-04-11 – 2016-04-19 (×9): 1 via ORAL
  Filled 2016-04-11 (×9): qty 1

## 2016-04-11 MED ORDER — CARBIDOPA-LEVODOPA 25-100 MG PO TABS
1.5000 | ORAL_TABLET | ORAL | Status: DC
  Administered 2016-04-12 – 2016-04-20 (×25): 1.5 via ORAL
  Filled 2016-04-11: qty 2
  Filled 2016-04-11: qty 1.5
  Filled 2016-04-11 (×3): qty 2
  Filled 2016-04-11: qty 1.5
  Filled 2016-04-11 (×2): qty 2
  Filled 2016-04-11: qty 1.5
  Filled 2016-04-11 (×5): qty 2
  Filled 2016-04-11: qty 1.5
  Filled 2016-04-11 (×3): qty 2
  Filled 2016-04-11: qty 1.5
  Filled 2016-04-11: qty 2
  Filled 2016-04-11: qty 1.5
  Filled 2016-04-11 (×5): qty 2

## 2016-04-11 MED ORDER — PERFLUTREN LIPID MICROSPHERE
1.0000 mL | INTRAVENOUS | Status: AC | PRN
  Administered 2016-04-11: 2 mL via INTRAVENOUS
  Filled 2016-04-11: qty 10

## 2016-04-11 NOTE — Progress Notes (Signed)
Patient was informed about rehab process,safety plan and reviewed the rehab booklet.

## 2016-04-11 NOTE — Progress Notes (Signed)
  Echocardiogram 2D Echocardiogram has been performed.  Jennette Dubin 04/11/2016, 3:46 PM
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
## 2016-04-11 NOTE — Progress Notes (Signed)
James Lorie Phenix, MD Physician Signed Physical Medicine and Rehabilitation  Consult Note Date of Service: 04/11/2016 5:49 AM  Related encounter: ED to Hosp-Admission (Current) from 04/08/2016 in [place] All Collapse All   [] Hide copied text [] Hover for attribution information      Physical Medicine and Rehabilitation Consult Reason for Consult: Right pontine infarct Referring Physician: [facility]   HPI: CASTOR PRESS is a 65 y.o. right handed male with history of Parkinson's disease. Per chart review patient lives with spouse. One level home with a ramped entrance. Used a rolling walker for mobility but still was very limited. Wife reports spends most of his time in a recliner. Wife assist with lower body ADLs. Presented 04/08/2016 after reported syncopal episode with left facial droop while parked at [employer] parking lot. MRI showed right pontine infarct. MRA with no emergent large vessel occlusion or stenosis. Echocardiogram pending. Carotid Doppler showed no ICA stenosis. Did not receive TPA. Neurology follow-up currently on Plavix therapy for CVA prophylaxis. Subcutaneous Lovenox for DVT prophylaxis. Physical and occupational therapy evaluations completed with recommendations of physical medicine rehabilitation consult.   Review of Systems  Constitutional: Negative for chills and fever.  Eyes: Negative for blurred vision and double vision.  Respiratory: Negative for cough and shortness of breath.   Cardiovascular: Positive for leg swelling. Negative for chest pain and palpitations.  Gastrointestinal: Positive for constipation. Negative for nausea and vomiting.  Genitourinary:       Urinary incontinence  Musculoskeletal: Positive for back pain, joint pain and myalgias.  Neurological: Positive for dizziness, weakness and headaches.  Psychiatric/Behavioral: Positive for depression.       Anxiety  All other systems reviewed and  are negative.      Past Medical History:  Diagnosis Date  . Anxiety   . Cervical spondylosis   . Degenerative disc disease, cervical   . Depression   . Headache(784.0)   . Hypertension   . OA (osteoarthritis)   . OSA (obstructive sleep apnea)    SEVERE OSA PER STUDY 2005--  USES NASAL CANNULA  WITH SETTING AT 12 -- NO MASK  . Parkinson's disease ([place]) NEUROLOGIST--  DR Jim Like   IN A STUDY AT [facility]--- lov note crae everywhere 02-19-2013 dr Kalman Shan scott  . Right ureteral stone   . Spinal stenosis of lumbar region   . Urinary incontinence    SECONDARY TO PARKINSON'S DISEASE  . Wears glasses         Past Surgical History:  Procedure Laterality Date  . CHOLECYSTECTOMY  2000  . CYSTOSCOPY WITH RETROGRADE PYELOGRAM, URETEROSCOPY AND STENT PLACEMENT Right 11/13/2013   Procedure: CYSTOSCOPY WITH RIGHT URETEROSCOPY, Right Retrograde Pyelogram, RIGHT URETERAL STENT PLACEMENT, Basket Stone Extraction;  Surgeon: Ardis Hughs, MD;  Location: [facility];  Service: Urology;  Laterality: Right;  . HIP PINNING,CANNULATED Right 04/13/2013   Procedure: CANNULATED HIP PINNING;  Surgeon: Johnn Hai, MD;  Location: [facility];  Service: Orthopedics;  Laterality: Right;  PERCUTANEOUS CANNULATED RIGHT HIP PINNING  . HOLMIUM LASER APPLICATION Right [id number]   Procedure: RIGHT LASER LITHOTRIPSY;  Surgeon: Ardis Hughs, MD;  Location[facility];  Service: Urology;  Laterality: Right;  . KNEE ARTHROSCOPY W/ DEBRIDEMENT Bilateral RIGHT  04-07-2008/   LEFT 10-13-2005  . LUMBAR LAMINECTOMY  01-05-2010   L2  --  L5  . TOTAL KNEE ARTHROPLASTY Left 03/04/2013   Procedure: LEFT TOTAL KNEE ARTHROPLASTY;  Surgeon: Gearlean Alf, MD;  Location: [facility];  Service: Orthopedics;  Laterality: Left;         Family History  Problem Relation Age of Onset  . Cancer Mother   . Parkinson's disease      H/O  . Alzheimer's disease      H/O  .  ALS      H/O   Social History:  reports that he has never smoked. He has never used smokeless tobacco. He reports that he does not drink alcohol or use drugs. Allergies: No Known Allergies       Medications Prior to Admission  Medication Sig Dispense Refill  . aspirin EC 81 MG tablet Take 81 mg by mouth every morning.    . carbidopa-levodopa (SINEMET CR) 50-200 MG per tablet Take 1 tablet by mouth at bedtime. 90 tablet 3  . carbidopa-levodopa (SINEMET IR) 25-100 MG per tablet Take 1 tablet by mouth 4 (four) times daily. May take an additional onehalf tab in the morning when you wake up to decrease freezing episode (Patient taking differently: Take 1.5-2 tablets by mouth See admin instructions. Take 2 tablets at 6 am then take 1 and 1/2 tablets at 10 am, 2 pm and 6 pm) 405 tablet 3  . escitalopram (LEXAPRO) 10 MG tablet Take 10 mg by mouth 2 (two) times daily.    Marland Kitchen lisinopril (PRINIVIL,ZESTRIL) 10 MG tablet Take 10 mg by mouth daily.     . ondansetron (ZOFRAN) 4 MG tablet Take 4 mg by mouth every 8 (eight) hours as needed for nausea or vomiting.    Marland Kitchen oxyCODONE-acetaminophen (PERCOCET/ROXICET) 5-325 MG tablet Take 1 tablet by mouth every 6 (six) hours as needed for severe pain.    . pramipexole (MIRAPEX) 1 MG tablet Take 1 tablet (1 mg total) by mouth 3 (three) times daily. (Patient taking differently: Take 0.5 mg by mouth 3 (three) times daily. ) 270 tablet 3  . rasagiline (AZILECT) 1 MG TABS tablet Take 1 tablet (1 mg total) by mouth every morning. 90 tablet 3  . tamsulosin (FLOMAX) 0.4 MG CAPS capsule Take 0.4 mg by mouth daily.    Marland Kitchen LORazepam (ATIVAN) 1 MG tablet Take 1 tablet (1 mg total) by mouth 4 (four) times daily as needed for anxiety. (Patient not taking: Reported on 04/08/2016) 30 tablet 0  . phenazopyridine (PYRIDIUM) 200 MG tablet Take 1 tablet (200 mg total) by mouth 3 (three) times daily as needed for pain. (Patient not taking: Reported on 04/08/2016) 10 tablet 0     Home: Home Living Family/patient expects to be discharged to:: Skilled nursing facility (potential CIR) Living Arrangements: Spouse/significant other Available Help at Discharge: Family, Available 24 hours/day Type of Home: House Home Access: Ramped entrance (+1 step into kitchen) Home Layout: One level Bathroom Shower/Tub: Multimedia programmer: Handicapped height Home Equipment: Civil engineer, contracting, Grab bars - tub/shower, Environmental consultant - 2 wheels  Functional History: Prior Function Level of Independence: Needs assistance Gait / Transfers Assistance Needed: RW for mobility but limited. Wife reports pt spends most of his time in a recliner. ADL's / Homemaking Assistance Needed: Wife assists with LB ADL and fine motor tasks (buttons, etc). Pt does not do IADL. Functional Status:  Mobility: Bed Mobility Overal bed mobility: Needs Assistance, +2 for physical assistance Bed Mobility: Rolling, Sidelying to Sit, Sit to Sidelying Rolling: Mod assist, +2 for physical assistance Sidelying to sit: Max assist, +2 for physical assistance Sit to sidelying: Max assist, +2 for physical  assistance General bed mobility comments: +2 maximal assist for bed mobility, increased physical assist for rolling both directions to sidyelying (hygiene and pericare performed) then +2 max assist to elevate to upright at EOB. Patient attempting the use of UEs to assist. Transfers Overall transfer level: Needs assistance Equipment used: 2 person hand held assist (face to face with gait belt ) Transfers: Sit to/from Stand Sit to Stand: Mod assist, +2 physical assistance, From elevated surface General transfer comment: Patient required VCs for positioning and technique. increased time to perform. Require rocker technique due to PD deficits. moderate assist to power up and maintain stability Ambulation/Gait Ambulation/Gait assistance: Max assist, +2 physical assistance Ambulation Distance (Feet): 6 Feet Assistive  device: 2 person hand held assist (face to face with wrap around via gait belt) Gait Pattern/deviations: Step-to pattern, Shuffle General Gait Details: patient required rocker step and max multi modal cues to initate step. Posterior list present. Patient with increased difficulty weight shifting requiring manual faciliatation.  Gait velocity: decreased Gait velocity interpretation: <1.8 ft/sec, indicative of risk for recurrent falls    ADL: ADL Overall ADL's : Needs assistance/impaired Grooming: Min guard, Sitting, Cueing for safety, Cueing for sequencing Upper Body Bathing: Minimal assitance, Sitting Lower Body Bathing: Maximal assistance, Sit to/from stand, +2 for physical assistance Upper Body Dressing : Minimal assistance, Sitting Lower Body Dressing: Maximal assistance, +2 for physical assistance, Sit to/from stand Toilet Transfer Details (indicate cue type and reason): bed level at this time Toileting- Clothing Manipulation and Hygiene: Total assistance, Bed level, +2 for physical assistance Functional mobility during ADLs: Maximal assistance, +2 for physical assistance (for sit to stand only) General ADL Comments: Pt minimally interactive this session. Assisted RN with bed mobility during in and out cath and cleaning pt after BM.  Cognition: Cognition Overall Cognitive Status: Impaired/Different from baseline Orientation Level: Oriented to person, Oriented to place, Disoriented to time, Disoriented to situation Cognition Arousal/Alertness: Awake/alert Behavior During Therapy: Flat affect Overall Cognitive Status: Impaired/Different from baseline Area of Impairment: Following commands, Safety/judgement, Problem solving Following Commands: Follows one step commands consistently, Follows one step commands with increased time Safety/Judgement: Decreased awareness of safety, Decreased awareness of deficits Problem Solving: Slow processing, Decreased initiation, Requires verbal  cues General Comments: Pt with flat affect and slow processing during activities. Wife reports pt has been slightly agitated; pt telling her to "shut up" which he reports he never says.  Blood pressure 122/72, pulse 78, temperature 98.3 F (36.8 C), temperature source Oral, resp. rate 18, weight 123.9 kg (273 lb 2.4 oz), SpO2 95 %. Physical Exam  Vitals reviewed. Constitutional: He appears well-developed and well-nourished.  HENT:  Head: Normocephalic and atraumatic.  Eyes: Conjunctivae and EOM are normal.  Neck: Normal range of motion. Neck supple. No thyromegaly present.  Cardiovascular: Normal rate and regular rhythm.   Respiratory: Effort normal and breath sounds normal. No respiratory distress.  GI: Soft. Bowel sounds are normal. He exhibits no distension.  Musculoskeletal:  Tone increased with cogwheeling and rigidity  Neurological: He is alert.  Follow 3 step commands.  Able to name person, place and date of birth.  Fair awareness of deficits Motor: Not completely reliable due to participation with pt saying, "I can't give you full participation because I will poop on you" B/l UE ?4+/5, rigidity RLE: hip flexion 2/5, knee extension 3/5, ankle dorsi/plantar flexion 4/5 LLE: Hip flexion 4/5, knee extension 4/5, ankle doris/plantar flexion 4+/5  DTRs symmetric  Skin: Skin is warm and dry.  Psychiatric: His  affect is blunt. His speech is slurred. He is slowed.  Altered affect    Lab Results Last 24 Hours  No results found for this or any previous visit (from the past 24 hour(s)).    Imaging Results (Last 48 hours)  Dg Chest 1 View  Result Date: 04/09/2016 CLINICAL DATA:  TIA EXAM: CHEST 1 VIEW COMPARISON:  02/08/2016 FINDINGS: Cardiac enlargement with pulmonary vascular congestion. No edema or effusion Hypoventilation with mild bibasilar atelectasis IMPRESSION: Pulmonary vascular congestion Hypoventilation Electronically Signed   By: Franchot Gallo M.D.   On: 04/09/2016  07:43     Assessment/Plan: Diagnosis: Right pontine infarct Labs and images independently reviewed.  Records reviewed and summated above. Stroke: Continue secondary stroke prophylaxis and Risk Factor Modification listed below:   Antiplatelet therapy:   Blood Pressure Management:  Continue current medication with prn's with permisive HTN per primary team Statin Agent:   Diabetes management:   Right sided hemiparesis: fit for orthosis to prevent contractures (resting hand splint for day, wrist cock up splint at night, PRAFO, etc) Motor recovery: Fluoxetine  1. Does the need for close, 24 hr/day medical supervision in concert with the patient's rehab needs make it unreasonable for this patient to be served in a less intensive setting? Yes 2. Co-Morbidities requiring supervision/potential complications: Parkinson's disease (Cont recs per study at [facility]), cervical spondylosis (monitor), depression (ensure mood does not hinder progress of therapies), OA (ensure pain does not limit therapies), severe OSA (monitor for daytime somnolence, cont CPAP), anxiety (ensure anxiety and resulting apprehension do not limit functional progress; consider prn medications if warranted) 3. Due to bladder management, safety, skin/wound care, disease management, medication administration and patient education, does the patient require 24 hr/day rehab nursing? Yes 4. Does the patient require coordinated care of a physician, rehab nurse, PT (1-2 hrs/day, 1-2 days/week) and OT (5 hrs/day, 1-2 days/week) to address physical and functional deficits in the context of the above medical diagnosis(es)? Yes Addressing deficits in the following areas: balance, endurance, locomotion, strength, transferring, bowel/bladder control, bathing, dressing, feeding, grooming, toileting, cognition, speech, language and psychosocial support 5. Can the patient actively participate in an intensive therapy program of at least 3 hrs of therapy  per day at least 5 days per week? Yes 6. The potential for patient to make measurable gains while on inpatient rehab is excellent 7. Anticipated functional outcomes upon discharge from inpatient rehab are min assist and mod assist  with PT, min assist and mod assist with OT, min assist and mod assist with SLP. 8. Estimated rehab length of stay to reach the above functional goals is: 17-20 days. 9. Does the patient have adequate social supports and living environment to accommodate these discharge functional goals? Potentially 10. Anticipated D/C setting: Home 11. Anticipated post D/C treatments: HH therapy and Home excercise program 12. Overall Rehab/Functional Prognosis: fair  RECOMMENDATIONS: This patient's condition is appropriate for continued rehabilitative care in the following setting: Would consider CIR to decrease burden of care if family accepting of reaching realistic goals (will cont to need assistance at discharge).  Patient has agreed to participate in recommended program. Potentially Note that insurance prior authorization may be required for reimbursement for recommended care.  Comment: Rehab Admissions Coordinator to follow up.  Delice Lesch, MD 04/11/2016    Revision History  Routing History
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
## 2016-04-11 NOTE — PMR Pre-admission (Signed)
PMR Admission Coordinator Pre-Admission Assessment  Patient: James Schultz is an 65 y.o., male MRN: [id number] DOB: March 17, 1951 Height:   Weight: 123.9 kg (273 lb 2.4 oz)              Insurance Information HMO:     PPO:      PCP:      IPA:      80/20: yes     OTHER:  No HMO PRIMARY: Medicare a and b      Policy#: [id number]      Subscriber: pt Benefits:  Phone #: passport one online     Name: 04/11/16 Eff. Date: 12/13/2008     Deduct: $1316      Out of Pocket Max: none      Life Max: none CIR: 100%      SNF: 20 full days Outpatient: 80%     Co-Pay: 20% Home Health: 100%      Co-Pay: none DME: 80%     Co-Pay: 20% Providers: pt choice  SECONDARY: [employer]      Policy#: [id number]      Subscriber: pt  Medicaid Application Date:       Case Manager:  Disability Application Date:       Case Worker:   Emergency [place]    Name Relation Home Work Mobile   [place] Spouse [id number]     Current Medical History  Patient Admitting Diagnosis:  Right pontine infarct; parkinsons  History of Present Illness:James L Blackwellis a 65 y.o.right handed malewith history of Parkinson's disease. Used a rolling walker for mobility but still was very limited. Wife reports spends most of his time in a recliner. Wife assist with lower body ADLs. Presented 04/08/2016 after reported syncopal episode with left facial droop while parked at [employer] parking lot. MRI showed right pontine infarct. MRA with no emergent large vessel occlusion or stenosis. Echocardiogram pending. Carotid Doppler showed no ICA stenosis. Did not receive TPA. Neurology follow-up currently on Plavix therapy for CVA prophylaxis. Subcutaneous Lovenox for DVT prophylaxis.   Total: 1 NIH    Past Medical History  Past Medical History:  Diagnosis Date  . Anxiety   . Cervical spondylosis   . Degenerative disc disease, cervical   . Depression   . Headache(784.0)   .  Hypertension   . OA (osteoarthritis)   . OSA (obstructive sleep apnea)    SEVERE OSA PER STUDY 2005--  USES NASAL CANNULA  WITH SETTING AT 12 -- NO MASK  . Parkinson's disease ([place]) NEUROLOGIST--  DR Jim Like   IN A STUDY AT [facility]--- lov note crae everywhere 02-19-2013 dr Kalman Shan scott  . Right ureteral stone   . Spinal stenosis of lumbar region   . Urinary incontinence    SECONDARY TO PARKINSON'S DISEASE  . Wears glasses     Family History  family history includes Cancer in his mother.  Prior Rehab/Hospitalizations:  Has the patient had major surgery during 100 days prior to admission? No Previous SNF rehab after knee replacment and hip in 2014  Current Medications   Current Facility-Administered Medications:  .  atorvastatin (LIPITOR) tablet 20 mg, 20 mg, Oral, q1800, David L Rinehuls, PA-C, 20 mg at 04/10/16 1802 .  carbidopa-levodopa (SINEMET CR) 50-200 MG per tablet controlled release 1 tablet, 1 tablet, Oral, QHS, Reubin Milan, MD, 1 tablet at 04/10/16 2202 .  carbidopa-levodopa (SINEMET IR) 25-100 MG per tablet immediate release 1.5 tablet, 1.5 tablet,  Oral, 3 times per day, Reubin Milan, MD, 1.5 tablet at 04/11/16 1404 .  carbidopa-levodopa (SINEMET IR) 25-100 MG per tablet immediate release 2 tablet, 2 tablet, Oral, Q0600, Reubin Milan, MD, 2 tablet at 04/11/16 0532 .  clopidogrel (PLAVIX) tablet 75 mg, 75 mg, Oral, Daily, Rosalin Hawking, MD, 75 mg at 04/11/16 1058 .  enoxaparin (LOVENOX) injection 60 mg, 60 mg, Subcutaneous, Q24H, Reubin Milan, MD, 60 mg at 04/11/16 0825 .  escitalopram (LEXAPRO) tablet 10 mg, 10 mg, Oral, BID, Reubin Milan, MD, 10 mg at 04/11/16 1058 .  lisinopril (PRINIVIL,ZESTRIL) tablet 10 mg, 10 mg, Oral, Daily, Reubin Milan, MD, 10 mg at 04/11/16 1058 .  ondansetron (ZOFRAN) tablet 4 mg, 4 mg, Oral, Q8H PRN, Reubin Milan, MD .  oxyCODONE-acetaminophen (PERCOCET/ROXICET) 5-325 MG per tablet 1 tablet, 1 tablet, Oral,  Q6H PRN, Reubin Milan, MD, 1 tablet at 04/10/16 2202 .  pramipexole (MIRAPEX) tablet 0.5 mg, 0.5 mg, Oral, TID, Reubin Milan, MD, 0.5 mg at 04/11/16 1418 .  rasagiline (AZILECT) tablet 1 mg, 1 mg, Oral, q morning - 10a, Reubin Milan, MD, 1 mg at 04/11/16 0532 .  senna-docusate (Senokot-S) tablet 2 tablet, 2 tablet, Oral, QHS PRN, Reubin Milan, MD, 2 tablet at 04/09/16 0353 .  sodium chloride flush (NS) 0.9 % injection 3 mL, 3 mL, Intravenous, Q12H, Reubin Milan, MD, 3 mL at 04/11/16 1000 .  tamsulosin (FLOMAX) capsule 0.4 mg, 0.4 mg, Oral, Daily, Reubin Milan, MD, 0.4 mg at 04/11/16 1057  Patients Current Diet: Diet Heart Room service appropriate? Yes; Fluid consistency: Thin Diet - low sodium heart healthy  Precautions / Restrictions Precautions Precautions: Fall Restrictions Weight Bearing Restrictions: No   Has the patient had 2 or more falls or a fall with injury in the past year?Yes  Prior Activity Level Community (5-7x/wk): declining functional status over past 6 months. Left hip pain for which ortho MD recommended steorid injection the week of pt's admission. Used RW to bathroom alone at night. Wife provided supervision on RW during the day. Wife assisted with adls  Home Assistive Devices / Equipment Home Assistive Devices/Equipment: CPAP Home Equipment: Shower seat, Grab bars - tub/shower, Environmental consultant - 2 wheels  Prior Device Use: Indicate devices/aids used by the patient prior to current illness, exacerbation or injury? Walker  Prior Functional Level Prior Function Level of Independence: Needs assistance Gait / Transfers Assistance Needed: RW for mobility but limited. Wife reports pt spends most of his time in a recliner. ADL's / Homemaking Assistance Needed: Wife assists with LB ADL and fine motor tasks (buttons, etc). Pt does not do IADL. Comments: Pt up to bathroom with RW at night alone, wife would provide CGA for pt to ambulate with RW  during the day  Self Care: Did the patient need help bathing, dressing, using the toilet or eating?  Needed some help  Indoor Mobility: Did the patient need assistance with walking from room to room (with or without device)? Needed some help  Stairs: Did the patient need assistance with internal or external stairs (with or without device)? Needed some help  Functional Cognition: Did the patient need help planning regular tasks such as shopping or remembering to take medications? Needed some help  Current Functional Level Cognition  Arousal/Alertness: Awake/alert Overall Cognitive Status: Impaired/Different from baseline Orientation Level: Oriented to person, Oriented to place, Disoriented to time, Disoriented to situation Following Commands: Follows one step commands with increased time  Safety/Judgement: Decreased awareness of deficits General Comments: pt very slow to respond Attention: Sustained Sustained Attention: Impaired Sustained Attention Impairment: Verbal basic Memory: Impaired Memory Impairment: Decreased short term memory, Storage deficit, Retrieval deficit Decreased Short Term Memory: Verbal basic Awareness: Impaired Awareness Impairment: Emergent impairment Problem Solving: Impaired Problem Solving Impairment: Verbal basic, Functional basic Executive Function: Reasoning Reasoning: Impaired (abstract reasoning impaired) Reasoning Impairment: Verbal basic Safety/Judgment: Impaired (required cueing)    Extremity Assessment (includes Sensation/Coordination)  Upper Extremity Assessment: RUE deficits/detail, LUE deficits/detail RUE Deficits / Details: Strength, AROM, sensation WFL. RUE Coordination: decreased fine motor LUE Deficits / Details: Strength, AROM, sensation WFL. LUE Coordination: decreased fine motor  Lower Extremity Assessment: Generalized weakness, RLE deficits/detail, LLE deficits/detail RLE Deficits / Details: generalized weakness noted BLE RLE  Coordination: decreased fine motor, decreased gross motor LLE Deficits / Details: generalized weakness noted BLE LLE Coordination: decreased fine motor, decreased gross motor    ADLs  Overall ADL's : Needs assistance/impaired Grooming: Wash/dry hands, Wash/dry face, Applying deodorant, Brushing hair, Minimal assistance Upper Body Bathing: Minimal assitance, Cueing for sequencing, Sitting Lower Body Bathing: Moderate assistance, Maximal assistance Upper Body Dressing : Moderate assistance Lower Body Dressing: Maximal assistance, With adaptive equipment, Cueing for sequencing, Sit to/from stand Toilet Transfer Details (indicate cue type and reason):  (Pt. is Mod A with sit to stand and pivot to [facility] and back.) Toileting- Clothing Manipulation and Hygiene: Total assistance, Bed level, +2 for physical assistance Functional mobility during ADLs: Moderate assistance General ADL Comments:  (Pt. requries extra time to perform tasks. )    Mobility  Overal bed mobility: Needs Assistance Bed Mobility: Rolling, Sidelying to Sit Rolling: Mod assist Sidelying to sit: Mod assist, +2 for physical assistance, HOB elevated Sit to sidelying: Max assist, +2 for physical assistance General bed mobility comments: max directional v/c's to complete task, assist for trunk elevation    Transfers  Overall transfer level: Needs assistance Equipment used: 2 person hand held assist Transfers: Sit to/from Stand, Stand Pivot Transfers Sit to Stand: Mod assist Stand pivot transfers: Mod assist, +2 physical assistance General transfer comment: max directional v/c's to complete task due to delayed processing, significant increase in time. assist for walker management    Ambulation / Gait / Stairs / Wheelchair Mobility  Ambulation/Gait Ambulation/Gait assistance: +2 physical assistance, Mod assist Ambulation Distance (Feet): 2 Feet Assistive device: Rolling walker (2 wheeled) Gait Pattern/deviations: Step-to  pattern, Decreased stride length General Gait Details: pt able to march in place x 10 reps and then had a BM, transfered to [facility] with minAx2. After pt stood for hygiene attempted to ambulate and pt unable to take more than 4 steps despite maxA for weight shifting and v/c's to advance/sequence stepping pattern, pt unable to clear foot and repeated stated, i need to sit down, my back hurts Gait velocity: decreased Gait velocity interpretation: <1.8 ft/sec, indicative of risk for recurrent falls    Posture / Balance Balance Overall balance assessment: Needs assistance Sitting-balance support: No upper extremity supported, Feet supported Sitting balance-Leahy Scale: Fair Standing balance support: Bilateral upper extremity supported, During functional activity Standing balance-Leahy Scale: Poor Standing balance comment: pt stood x 2 min for hygiene, dependent on RW    Special needs/care consideration BiPAP/CPAP yes Skin intact Bowel mgmt: LBM 8/28 incontinent Bladder mgmt: incontinent Diabetic mgmt n/a   Previous Home Environment Living Arrangements: Spouse/significant other  Lives With: Spouse Available Help at Discharge: Family, Available 24 hours/day Type of Home: House Home Layout: One level Home Access:  Ramped entrance Bathroom Shower/Tub: Multimedia programmer: Handicapped height Bathroom Accessibility: Yes How Accessible: Accessible via walker [place]: No Additional Comments: had bars in bathrooms to assist   Discharge Living Setting Plans for Discharge Living Setting: Patient's home, Lives with (comment) (wife) Type of Home at Discharge: House Discharge Home Layout: One level Discharge Home Access: [place] entrance Discharge Bathroom Shower/Tub: Walk-in shower Discharge Bathroom Toilet: Handicapped height Discharge Bathroom Accessibility: Yes How Accessible: Accessible via walker Does the patient have any problems obtaining your medications?:  No  Social/Family/Support Systems Patient Roles: Spouse, 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 Information: [place], wife Anticipated Caregiver: wife can provide min assist but has daughter to help after dtr works, and 47 yo grandson to assist Anticipated Ambulance person Information: see above Ability/Limitations of Caregiver: wife unemployed with history of back surgery and autoimmune disorder Caregiver Availability: 24/7 Discharge Plan Discussed with Primary Caregiver: Yes Is Caregiver In Agreement with Plan?: Yes Does Caregiver/Family have Issues with Lodging/Transportation while Pt is in Rehab?: No  Goals/Additional Needs Patient/Family Goal for Rehab: Min to mod assist with PT and OT supervision with SLP Expected length of stay: [facility]OS 17-20 days Pt/Family Agrees to Admission and willing to participate: Yes Program Orientation Provided & Reviewed with Pt/Caregiver Including Roles  & Responsibilities: Yes   Decrease burden of Care through IP rehab admission: Decrease number of caregivers, Bowel and bladder program and Patient/family education  Possible need for SNF placement upon discharge: pt and wife are aware that if pt unable to tolerate intensity or fails to progress to level that she can assist at home, that SNF rehab will be pursued.   Patient Condition: This patient's condition remains as documented in the consult dated 04/11/2016, in which the Rehabilitation Physician determined and documented that the patient's condition is appropriate for intensive rehabilitative care in an inpatient rehabilitation facility. Will admit to inpatient rehab today.   Preadmission Screen Completed By:  Cleatrice Burke, 04/11/2016 5:47 PM ______________________________________________________________________   Discussed status with Dr. Posey Pronto on 04/11/2016 at  1747 and received telephone approval for admission today.  Admission Coordinator:  Cleatrice Burke, time [id number] Date 04/11/2016

## 2016-04-11 NOTE — H&P (View-Only) (Signed)
Physical Medicine and Rehabilitation Admission H&P    Chief Complaint  Patient presents with  . Loss of Consciousness  : HPI: James Schultz is a 65 y.o. right handed male with history of Parkinson's disease. Per chart review patient lives with spouse. One level home with a ramped entrance. Used a rolling walker for mobility but still was very limited. Wife reports spends most of his time in a recliner. Wife assist with lower body ADLs. Presented 04/08/2016 after reported syncopal episode with left facial droop while parked at [employer] parking lot. MRI showed right pontine infarct. MRA with no emergent large vessel occlusion or stenosis. Echocardiogram pending. Carotid Doppler showed no ICA stenosis. Did not receive TPA. Neurology follow-up currently on Plavix therapy for CVA prophylaxis. Subcutaneous Lovenox for DVT prophylaxis. Physical and occupational therapy evaluations completed with recommendations of physical medicine rehabilitation consult.Patient was admitted for comprehensive rehabilitation program  ROS Constitutional: Negative for chills and fever.  Eyes: Negative for blurred vision and double vision.  Respiratory: Negative for cough and shortness of breath.   Cardiovascular: Positive for leg swelling. Negative for chest pain and palpitations.  Gastrointestinal: Positive for constipation. Negative for nausea and vomiting.  Genitourinary:       Urinary incontinence  Musculoskeletal: Positive for back pain, joint pain and myalgias.  Neurological: Positive for dizziness, weakness and headaches.  Psychiatric/Behavioral: Positive for depression.       Anxiety  All other systems reviewed and are negative   Past Medical History:  Diagnosis Date  . Anxiety   . Cervical spondylosis   . Degenerative disc disease, cervical   . Depression   . Headache(784.0)   . Hypertension   . OA (osteoarthritis)   . OSA (obstructive sleep apnea)    SEVERE OSA PER STUDY 2005--  USES NASAL  CANNULA  WITH SETTING AT 12 -- NO MASK  . Parkinson's disease ([place]) NEUROLOGIST--  DR Jim Like   IN A STUDY AT [facility]--- lov note crae everywhere 02-19-2013 dr Kalman Shan scott  . Right ureteral stone   . Spinal stenosis of lumbar region   . Urinary incontinence    SECONDARY TO PARKINSON'S DISEASE  . Wears glasses    Past Surgical History:  Procedure Laterality Date  . CHOLECYSTECTOMY  2000  . CYSTOSCOPY WITH RETROGRADE PYELOGRAM, URETEROSCOPY AND STENT PLACEMENT Right 11/13/2013   Procedure: CYSTOSCOPY WITH RIGHT URETEROSCOPY, Right Retrograde Pyelogram, RIGHT URETERAL STENT PLACEMENT, Basket Stone Extraction;  Surgeon: Ardis Hughs, MD;  Location: [facility];  Service: Urology;  Laterality: Right;  . HIP PINNING,CANNULATED Right 04/13/2013   Procedure: CANNULATED HIP PINNING;  Surgeon: Johnn Hai, MD;  Location: [facility];  Service: Orthopedics;  Laterality: Right;  PERCUTANEOUS CANNULATED RIGHT HIP PINNING   . HOLMIUM LASER APPLICATION Right [id number]   Procedure: RIGHT LASER LITHOTRIPSY;  Surgeon: Ardis Hughs, MD;  Location: [facility];  Service: Urology;  Laterality: Right;  . KNEE ARTHROSCOPY W/ DEBRIDEMENT Bilateral RIGHT  04-07-2008/   LEFT 10-13-2005  . LUMBAR LAMINECTOMY  01-05-2010   L2  --  L5  . TOTAL KNEE ARTHROPLASTY Left 03/04/2013   Procedure: LEFT TOTAL KNEE ARTHROPLASTY;  Surgeon: Gearlean Alf, MD;  Location: [facility];  Service: Orthopedics;  Laterality: Left;   Family History  Problem Relation Age of Onset  . Cancer Mother   . Parkinson's disease      H/O  . Alzheimer's disease      H/O  . ALS  H/O   Social History:  reports that he has never smoked. He has never used smokeless tobacco. He reports that he does not drink alcohol or use drugs. Allergies: No Known Allergies Medications Prior to Admission  Medication Sig Dispense Refill  . aspirin EC 81 MG tablet Take 81 mg by mouth every morning.    .  carbidopa-levodopa (SINEMET CR) 50-200 MG per tablet Take 1 tablet by mouth at bedtime. 90 tablet 3  . carbidopa-levodopa (SINEMET IR) 25-100 MG per tablet Take 1 tablet by mouth 4 (four) times daily. May take an additional onehalf tab in the morning when you wake up to decrease freezing episode (Patient taking differently: Take 1.5-2 tablets by mouth See admin instructions. Take 2 tablets at 6 am then take 1 and 1/2 tablets at 10 am, 2 pm and 6 pm) 405 tablet 3  . escitalopram (LEXAPRO) 10 MG tablet Take 10 mg by mouth 2 (two) times daily.    Marland Kitchen lisinopril (PRINIVIL,ZESTRIL) 10 MG tablet Take 10 mg by mouth daily.     . ondansetron (ZOFRAN) 4 MG tablet Take 4 mg by mouth every 8 (eight) hours as needed for nausea or vomiting.    Marland Kitchen oxyCODONE-acetaminophen (PERCOCET/ROXICET) 5-325 MG tablet Take 1 tablet by mouth every 6 (six) hours as needed for severe pain.    . pramipexole (MIRAPEX) 1 MG tablet Take 1 tablet (1 mg total) by mouth 3 (three) times daily. (Patient taking differently: Take 0.5 mg by mouth 3 (three) times daily. ) 270 tablet 3  . rasagiline (AZILECT) 1 MG TABS tablet Take 1 tablet (1 mg total) by mouth every morning. 90 tablet 3  . tamsulosin (FLOMAX) 0.4 MG CAPS capsule Take 0.4 mg by mouth daily.    Marland Kitchen LORazepam (ATIVAN) 1 MG tablet Take 1 tablet (1 mg total) by mouth 4 (four) times daily as needed for anxiety. (Patient not taking: Reported on 04/08/2016) 30 tablet 0  . phenazopyridine (PYRIDIUM) 200 MG tablet Take 1 tablet (200 mg total) by mouth 3 (three) times daily as needed for pain. (Patient not taking: Reported on 04/08/2016) 10 tablet 0    Home: Home Living Family/patient expects to be discharged to:: Skilled nursing facility (potential CIR) Living Arrangements: Spouse/significant other Available Help at Discharge: Family, Available 24 hours/day Type of Home: House Home Access: Ramped entrance (+1 step into kitchen) Home Layout: One level Bathroom Shower/Tub: Clinical cytogeneticist: Handicapped height Home Equipment: Civil engineer, contracting, Grab bars - tub/shower, Environmental consultant - 2 wheels  Lives With: Spouse   Functional History: Prior Function Level of Independence: Needs assistance Gait / Transfers Assistance Needed: RW for mobility but limited. Wife reports pt spends most of his time in a recliner. ADL's / Homemaking Assistance Needed: Wife assists with LB ADL and fine motor tasks (buttons, etc). Pt does not do IADL.  Functional Status:  Mobility: Bed Mobility Overal bed mobility: Needs Assistance Bed Mobility: Rolling, Sidelying to Sit Rolling: Mod assist Sidelying to sit: Mod assist, +2 for physical assistance, HOB elevated Sit to sidelying: Max assist, +2 for physical assistance General bed mobility comments: max directional v/c's to complete task, assist for trunk elevation Transfers Overall transfer level: Needs assistance Equipment used: 2 person hand held assist Transfers: Sit to/from Stand, Stand Pivot Transfers Sit to Stand: Mod assist Stand pivot transfers: Mod assist, +2 physical assistance General transfer comment: max directional v/c's to complete task due to delayed processing, significant increase in time. assist for walker management Ambulation/Gait Ambulation/Gait assistance: +2  physical assistance, Mod assist Ambulation Distance (Feet): 2 Feet Assistive device: Rolling walker (2 wheeled) Gait Pattern/deviations: Step-to pattern, Decreased stride length General Gait Details: pt able to march in place x 10 reps and then had a BM, transfered to [facility] with minAx2. After pt stood for hygiene attempted to ambulate and pt unable to take more than 4 steps despite maxA for weight shifting and v/c's to advance/sequence stepping pattern, pt unable to clear foot and repeated stated, i need to sit down, my back hurts Gait velocity: decreased Gait velocity interpretation: <1.8 ft/sec, indicative of risk for recurrent falls    ADL: ADL Overall  ADL's : Needs assistance/impaired Grooming: Wash/dry hands, Wash/dry face, Applying deodorant, Brushing hair, Minimal assistance Upper Body Bathing: Minimal assitance, Cueing for sequencing, Sitting Lower Body Bathing: Moderate assistance, Maximal assistance Upper Body Dressing : Moderate assistance Lower Body Dressing: Maximal assistance, With adaptive equipment, Cueing for sequencing, Sit to/from stand Toilet Transfer Details (indicate cue type and reason):  (Pt. is Mod A with sit to stand and pivot to [facility] and back.) Toileting- Clothing Manipulation and Hygiene: Total assistance, Bed level, +2 for physical assistance Functional mobility during ADLs: Moderate assistance General ADL Comments:  (Pt. requries extra time to perform tasks. )  Cognition: Cognition Overall Cognitive Status: Impaired/Different from baseline Arousal/Alertness: Awake/alert Orientation Level: Oriented to person, Oriented to place, Disoriented to time, Disoriented to situation Attention: Sustained Sustained Attention: Impaired Sustained Attention Impairment: Verbal basic Memory: Impaired Memory Impairment: Decreased short term memory, Storage deficit, Retrieval deficit Decreased Short Term Memory: Verbal basic Awareness: Impaired Awareness Impairment: Emergent impairment Problem Solving: Impaired Problem Solving Impairment: Verbal basic, Functional basic Executive Function: Reasoning Reasoning: Impaired (abstract reasoning impaired) Reasoning Impairment: Verbal basic Safety/Judgment: Impaired (required cueing) Cognition Arousal/Alertness: Awake/alert Behavior During Therapy: Flat affect Overall Cognitive Status: Impaired/Different from baseline Area of Impairment: Following commands, Safety/judgement, Problem solving, Awareness Following Commands: Follows one step commands with increased time Safety/Judgement: Decreased awareness of deficits Problem Solving: Slow processing, Difficulty sequencing, Requires  verbal cues, Requires tactile cues General Comments: pt very slow to respond  Physical Exam: Blood pressure (!) 96/57, pulse 82, temperature 98.4 F (36.9 C), temperature source Oral, resp. rate 16, weight 123.9 kg (273 lb 2.4 oz), SpO2 94 %. Physical Exam Vitals reviewed. Eyes: EOM are normal.  Neck: Normal range of motion. Neck supple. No thyromegaly present.  Cardiovascular: Normal rate and regular rhythm.   Respiratory: Effort normal and breath sounds normal. No respiratory distress.  GI: Soft. Bowel sounds are normal. He exhibits no distension.  Musculoskeletal:  Tone increased with cogwheeling and rigidity  Neurological: He is alert.  Follow 3 step commands.  Able to name person, place and date of birth.  Fair awareness of deficits Motor: Not completely reliable due to participation with pt saying, "I can't give you full participation because I will poop on you" B/l UE ?4+/5, rigidity RLE: hip flexion 2/5, knee extension 3/5, ankle dorsi/plantar flexion 4/5 LLE: Hip flexion 4/5, knee extension 4/5, ankle doris/plantar flexion 4+/5  DTRs symmetric  Skin: Skin is warm and dry Psychiatric: His affect is blunt. His speech is slurred. He is slowed.  Altered affect   No results found for this or any previous visit (from the past 48 hour(s)). No results found.   Medical Problem List and Plan: 1. Left-sided weakness secondary to right pontine infarct 2.  DVT Prophylaxis/Anticoagulation: Subcutaneous Lovenox. Monitor platelet counts of a signs of bleeding 3. Pain Management/Cervical spondylosis: Oxycodone as needed 4. Mood/Depression/Anxiety: Lexapro  10 mg twice a day 5. Neuropsych: This patient is capable of making decisions on his own behalf. 6. Skin/Wound Care: Routine skin checks 7. Fluids/Electrolytes/Nutrition: Routine I&O with follow-up chemistries 8. Hypertension. Lisinopril 10 mg daily 9. History of Parkinson's disease. Sinemet as directed, Mirapex 0.5 mg 3 times a  day,Azilect 1 mg daily. 10. BPH. Flomax 0.4 mg daily. Check PVR 3 11. Hyperlipidemia. Lipitor 12. OA left hip: Pt was scheduled to have injection.  Will monitor 13. Severe OSA: CPAP  Post Admission Physician Evaluation: 1. Functional deficits secondary  to right pontine infarct. 2. Patient is admitted to receive collaborative, interdisciplinary care between the physiatrist, rehab nursing staff, and therapy team. 3. Patient's level of medical complexity and substantial therapy needs in context of that medical necessity cannot be provided at a lesser intensity of care such as a SNF. 4. Patient has experienced substantial functional loss from his/her baseline which was documented above under the "Functional History" and "Functional Status" headings.  Judging by the patient's diagnosis, physical exam, and functional history, the patient has potential for functional progress which will result in measurable gains while on inpatient rehab.  These gains will be of substantial and practical use upon discharge  in facilitating mobility and self-care at the household level. 5. Physiatrist will provide 24 hour management of medical needs as well as oversight of the therapy plan/treatment and provide guidance as appropriate regarding the interaction of the two. 6. 24 hour rehab nursing will assist with bladder management, safety, skin/wound care, disease management, medication administration and patient education and help integrate therapy concepts, techniques,education, etc. 7. PT will assess and treat for/with: Lower extremity strength, range of motion, stamina, balance, functional mobility, safety, adaptive techniques and equipment, woundcare, coping skills, pain control, education.   Goals are: Mod/Min A. 8. OT will assess and treat for/with: ADL's, functional mobility, safety, upper extremity strength, adaptive techniques and equipment, wound mgt, ego support, and community reintegration.   Goals are:  Mod/Min A. Therapy may proceed with showering this patient. 9. SLP will assess and treat for/with: speech, cognition.  Goals are: Min A/Supervision. 10. Case Management and Social Worker will assess and treat for psychological issues and discharge planning. 11. Team conference will be held weekly to assess progress toward goals and to determine barriers to discharge. 12. Patient will receive at least 3 hours of therapy per day at least 5 days per week. 13. ELOS: 17-20 days.  14. Prognosis:  good and fair  Delice Lesch, MD 04/11/2016
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
## 2016-04-11 NOTE — H&P (Signed)
Physical Medicine and Rehabilitation Admission H&P    Chief Complaint  Patient presents with  . Loss of Consciousness  : HPI: James Schultz is a 65 y.o. right handed male with history of Parkinson's disease. Per chart review patient lives with spouse. One level home with a ramped entrance. Used a rolling walker for mobility but still was very limited. Wife reports spends most of his time in a recliner. Wife assist with lower body ADLs. Presented 04/08/2016 after reported syncopal episode with left facial droop while parked at [employer] parking lot. MRI showed right pontine infarct. MRA with no emergent large vessel occlusion or stenosis. Echocardiogram pending. Carotid Doppler showed no ICA stenosis. Did not receive TPA. Neurology follow-up currently on Plavix therapy for CVA prophylaxis. Subcutaneous Lovenox for DVT prophylaxis. Physical and occupational therapy evaluations completed with recommendations of physical medicine rehabilitation consult.Patient was admitted for comprehensive rehabilitation program  ROS Constitutional: Negative for chills and fever.  Eyes: Negative for blurred vision and double vision.  Respiratory: Negative for cough and shortness of breath.   Cardiovascular: Positive for leg swelling. Negative for chest pain and palpitations.  Gastrointestinal: Positive for constipation. Negative for nausea and vomiting.  Genitourinary:       Urinary incontinence  Musculoskeletal: Positive for back pain, joint pain and myalgias.  Neurological: Positive for dizziness, weakness and headaches.  Psychiatric/Behavioral: Positive for depression.       Anxiety  All other systems reviewed and are negative   Past Medical History:  Diagnosis Date  . Anxiety   . Cervical spondylosis   . Degenerative disc disease, cervical   . Depression   . Headache(784.0)   . Hypertension   . OA (osteoarthritis)   . OSA (obstructive sleep apnea)    SEVERE OSA PER STUDY 2005--  USES NASAL  CANNULA  WITH SETTING AT 12 -- NO MASK  . Parkinson's disease ([place]) NEUROLOGIST--  DR Jim Like   IN A STUDY AT [facility]--- lov note crae everywhere 02-19-2013 dr Kalman Shan scott  . Right ureteral stone   . Spinal stenosis of lumbar region   . Urinary incontinence    SECONDARY TO PARKINSON'S DISEASE  . Wears glasses    Past Surgical History:  Procedure Laterality Date  . CHOLECYSTECTOMY  2000  . CYSTOSCOPY WITH RETROGRADE PYELOGRAM, URETEROSCOPY AND STENT PLACEMENT Right 11/13/2013   Procedure: CYSTOSCOPY WITH RIGHT URETEROSCOPY, Right Retrograde Pyelogram, RIGHT URETERAL STENT PLACEMENT, Basket Stone Extraction;  Surgeon: Ardis Hughs, MD;  Location: [facility];  Service: Urology;  Laterality: Right;  . HIP PINNING,CANNULATED Right 04/13/2013   Procedure: CANNULATED HIP PINNING;  Surgeon: Johnn Hai, MD;  Location: [facility];  Service: Orthopedics;  Laterality: Right;  PERCUTANEOUS CANNULATED RIGHT HIP PINNING   . HOLMIUM LASER APPLICATION Right [id number]   Procedure: RIGHT LASER LITHOTRIPSY;  Surgeon: Ardis Hughs, MD;  Location: [facility];  Service: Urology;  Laterality: Right;  . KNEE ARTHROSCOPY W/ DEBRIDEMENT Bilateral RIGHT  04-07-2008/   LEFT 10-13-2005  . LUMBAR LAMINECTOMY  01-05-2010   L2  --  L5  . TOTAL KNEE ARTHROPLASTY Left 03/04/2013   Procedure: LEFT TOTAL KNEE ARTHROPLASTY;  Surgeon: Gearlean Alf, MD;  Location: [facility];  Service: Orthopedics;  Laterality: Left;   Family History  Problem Relation Age of Onset  . Cancer Mother   . Parkinson's disease      H/O  . Alzheimer's disease      H/O  . ALS  H/O   Social History:  reports that he has never smoked. He has never used smokeless tobacco. He reports that he does not drink alcohol or use drugs. Allergies: No Known Allergies Medications Prior to Admission  Medication Sig Dispense Refill  . aspirin EC 81 MG tablet Take 81 mg by mouth every morning.    .  carbidopa-levodopa (SINEMET CR) 50-200 MG per tablet Take 1 tablet by mouth at bedtime. 90 tablet 3  . carbidopa-levodopa (SINEMET IR) 25-100 MG per tablet Take 1 tablet by mouth 4 (four) times daily. May take an additional onehalf tab in the morning when you wake up to decrease freezing episode (Patient taking differently: Take 1.5-2 tablets by mouth See admin instructions. Take 2 tablets at 6 am then take 1 and 1/2 tablets at 10 am, 2 pm and 6 pm) 405 tablet 3  . escitalopram (LEXAPRO) 10 MG tablet Take 10 mg by mouth 2 (two) times daily.    Marland Kitchen lisinopril (PRINIVIL,ZESTRIL) 10 MG tablet Take 10 mg by mouth daily.     . ondansetron (ZOFRAN) 4 MG tablet Take 4 mg by mouth every 8 (eight) hours as needed for nausea or vomiting.    Marland Kitchen oxyCODONE-acetaminophen (PERCOCET/ROXICET) 5-325 MG tablet Take 1 tablet by mouth every 6 (six) hours as needed for severe pain.    . pramipexole (MIRAPEX) 1 MG tablet Take 1 tablet (1 mg total) by mouth 3 (three) times daily. (Patient taking differently: Take 0.5 mg by mouth 3 (three) times daily. ) 270 tablet 3  . rasagiline (AZILECT) 1 MG TABS tablet Take 1 tablet (1 mg total) by mouth every morning. 90 tablet 3  . tamsulosin (FLOMAX) 0.4 MG CAPS capsule Take 0.4 mg by mouth daily.    Marland Kitchen LORazepam (ATIVAN) 1 MG tablet Take 1 tablet (1 mg total) by mouth 4 (four) times daily as needed for anxiety. (Patient not taking: Reported on 04/08/2016) 30 tablet 0  . phenazopyridine (PYRIDIUM) 200 MG tablet Take 1 tablet (200 mg total) by mouth 3 (three) times daily as needed for pain. (Patient not taking: Reported on 04/08/2016) 10 tablet 0    Home: Home Living Family/patient expects to be discharged to:: Skilled nursing facility (potential CIR) Living Arrangements: Spouse/significant other Available Help at Discharge: Family, Available 24 hours/day Type of Home: House Home Access: Ramped entrance (+1 step into kitchen) Home Layout: One level Bathroom Shower/Tub: Clinical cytogeneticist: Handicapped height Home Equipment: Civil engineer, contracting, Grab bars - tub/shower, Environmental consultant - 2 wheels  Lives With: Spouse   Functional History: Prior Function Level of Independence: Needs assistance Gait / Transfers Assistance Needed: RW for mobility but limited. Wife reports pt spends most of his time in a recliner. ADL's / Homemaking Assistance Needed: Wife assists with LB ADL and fine motor tasks (buttons, etc). Pt does not do IADL.  Functional Status:  Mobility: Bed Mobility Overal bed mobility: Needs Assistance Bed Mobility: Rolling, Sidelying to Sit Rolling: Mod assist Sidelying to sit: Mod assist, +2 for physical assistance, HOB elevated Sit to sidelying: Max assist, +2 for physical assistance General bed mobility comments: max directional v/c's to complete task, assist for trunk elevation Transfers Overall transfer level: Needs assistance Equipment used: 2 person hand held assist Transfers: Sit to/from Stand, Stand Pivot Transfers Sit to Stand: Mod assist Stand pivot transfers: Mod assist, +2 physical assistance General transfer comment: max directional v/c's to complete task due to delayed processing, significant increase in time. assist for walker management Ambulation/Gait Ambulation/Gait assistance: +2  physical assistance, Mod assist Ambulation Distance (Feet): 2 Feet Assistive device: Rolling walker (2 wheeled) Gait Pattern/deviations: Step-to pattern, Decreased stride length General Gait Details: pt able to march in place x 10 reps and then had a BM, transfered to [facility] with minAx2. After pt stood for hygiene attempted to ambulate and pt unable to take more than 4 steps despite maxA for weight shifting and v/c's to advance/sequence stepping pattern, pt unable to clear foot and repeated stated, i need to sit down, my back hurts Gait velocity: decreased Gait velocity interpretation: <1.8 ft/sec, indicative of risk for recurrent falls    ADL: ADL Overall  ADL's : Needs assistance/impaired Grooming: Wash/dry hands, Wash/dry face, Applying deodorant, Brushing hair, Minimal assistance Upper Body Bathing: Minimal assitance, Cueing for sequencing, Sitting Lower Body Bathing: Moderate assistance, Maximal assistance Upper Body Dressing : Moderate assistance Lower Body Dressing: Maximal assistance, With adaptive equipment, Cueing for sequencing, Sit to/from stand Toilet Transfer Details (indicate cue type and reason):  (Pt. is Mod A with sit to stand and pivot to [facility] and back.) Toileting- Clothing Manipulation and Hygiene: Total assistance, Bed level, +2 for physical assistance Functional mobility during ADLs: Moderate assistance General ADL Comments:  (Pt. requries extra time to perform tasks. )  Cognition: Cognition Overall Cognitive Status: Impaired/Different from baseline Arousal/Alertness: Awake/alert Orientation Level: Oriented to person, Oriented to place, Disoriented to time, Disoriented to situation Attention: Sustained Sustained Attention: Impaired Sustained Attention Impairment: Verbal basic Memory: Impaired Memory Impairment: Decreased short term memory, Storage deficit, Retrieval deficit Decreased Short Term Memory: Verbal basic Awareness: Impaired Awareness Impairment: Emergent impairment Problem Solving: Impaired Problem Solving Impairment: Verbal basic, Functional basic Executive Function: Reasoning Reasoning: Impaired (abstract reasoning impaired) Reasoning Impairment: Verbal basic Safety/Judgment: Impaired (required cueing) Cognition Arousal/Alertness: Awake/alert Behavior During Therapy: Flat affect Overall Cognitive Status: Impaired/Different from baseline Area of Impairment: Following commands, Safety/judgement, Problem solving, Awareness Following Commands: Follows one step commands with increased time Safety/Judgement: Decreased awareness of deficits Problem Solving: Slow processing, Difficulty sequencing, Requires  verbal cues, Requires tactile cues General Comments: pt very slow to respond  Physical Exam: Blood pressure (!) 96/57, pulse 82, temperature 98.4 F (36.9 C), temperature source Oral, resp. rate 16, weight 123.9 kg (273 lb 2.4 oz), SpO2 94 %. Physical Exam Vitals reviewed. Eyes: EOM are normal.  Neck: Normal range of motion. Neck supple. No thyromegaly present.  Cardiovascular: Normal rate and regular rhythm.   Respiratory: Effort normal and breath sounds normal. No respiratory distress.  GI: Soft. Bowel sounds are normal. He exhibits no distension.  Musculoskeletal:  Tone increased with cogwheeling and rigidity  Neurological: He is alert.  Follow 3 step commands.  Able to name person, place and date of birth.  Fair awareness of deficits Motor: Not completely reliable due to participation with pt saying, "I can't give you full participation because I will poop on you" B/l UE ?4+/5, rigidity RLE: hip flexion 2/5, knee extension 3/5, ankle dorsi/plantar flexion 4/5 LLE: Hip flexion 4/5, knee extension 4/5, ankle doris/plantar flexion 4+/5  DTRs symmetric  Skin: Skin is warm and dry Psychiatric: His affect is blunt. His speech is slurred. He is slowed.  Altered affect   No results found for this or any previous visit (from the past 48 hour(s)). No results found.   Medical Problem List and Plan: 1. Left-sided weakness secondary to right pontine infarct 2.  DVT Prophylaxis/Anticoagulation: Subcutaneous Lovenox. Monitor platelet counts of a signs of bleeding 3. Pain Management/Cervical spondylosis: Oxycodone as needed 4. Mood/Depression/Anxiety: Lexapro  10 mg twice a day 5. Neuropsych: This patient is capable of making decisions on his own behalf. 6. Skin/Wound Care: Routine skin checks 7. Fluids/Electrolytes/Nutrition: Routine I&O with follow-up chemistries 8. Hypertension. Lisinopril 10 mg daily 9. History of Parkinson's disease. Sinemet as directed, Mirapex 0.5 mg 3 times a  day,Azilect 1 mg daily. 10. BPH. Flomax 0.4 mg daily. Check PVR 3 11. Hyperlipidemia. Lipitor 12. OA left hip: Pt was scheduled to have injection.  Will monitor 13. Severe OSA: CPAP  Post Admission Physician Evaluation: 1. Functional deficits secondary  to right pontine infarct. 2. Patient is admitted to receive collaborative, interdisciplinary care between the physiatrist, rehab nursing staff, and therapy team. 3. Patient's level of medical complexity and substantial therapy needs in context of that medical necessity cannot be provided at a lesser intensity of care such as a SNF. 4. Patient has experienced substantial functional loss from his/her baseline which was documented above under the "Functional History" and "Functional Status" headings.  Judging by the patient's diagnosis, physical exam, and functional history, the patient has potential for functional progress which will result in measurable gains while on inpatient rehab.  These gains will be of substantial and practical use upon discharge  in facilitating mobility and self-care at the household level. 5. Physiatrist will provide 24 hour management of medical needs as well as oversight of the therapy plan/treatment and provide guidance as appropriate regarding the interaction of the two. 6. 24 hour rehab nursing will assist with bladder management, safety, skin/wound care, disease management, medication administration and patient education and help integrate therapy concepts, techniques,education, etc. 7. PT will assess and treat for/with: Lower extremity strength, range of motion, stamina, balance, functional mobility, safety, adaptive techniques and equipment, woundcare, coping skills, pain control, education.   Goals are: Mod/Min A. 8. OT will assess and treat for/with: ADL's, functional mobility, safety, upper extremity strength, adaptive techniques and equipment, wound mgt, ego support, and community reintegration.   Goals are:  Mod/Min A. Therapy may proceed with showering this patient. 9. SLP will assess and treat for/with: speech, cognition.  Goals are: Min A/Supervision. 10. Case Management and Social Worker will assess and treat for psychological issues and discharge planning. 11. Team conference will be held weekly to assess progress toward goals and to determine barriers to discharge. 12. Patient will receive at least 3 hours of therapy per day at least 5 days per week. 13. ELOS: 17-20 days.  14. Prognosis:  good and fair  Delice Lesch, MD 04/11/2016

## 2016-04-11 NOTE — Discharge Instructions (Signed)
Ischemic Stroke Treated Without Warfarin °An ischemic stroke (cerebrovascular accident) is the sudden death of brain tissue. It is a medical emergency. An ischemic stroke can cause permanent loss of brain function. This can cause problems with different parts of your body. °CAUSES °An ischemic stroke is caused by a decrease of oxygen supply to an area of your brain. It is usually the result of a small blood clot (embolus) or collection of cholesterol or fat (plaque) that blocks blood flow in the brain. An ischemic stroke can also be caused by blocked or damaged carotid arteries. °RISK FACTORS °· High blood pressure (hypertension). °· High cholesterol. °· Diabetes mellitus. °· Heart disease. °· The buildup of plaque in the blood vessels (peripheral artery disease or atherosclerosis). °· The buildup of plaque in the blood vessels that provide blood and oxygen to the brain (carotid artery stenosis). °· An abnormal heart rhythm (atrial fibrillation). °· Obesity. °· Smoking cigarettes. °· Taking oral contraceptives, especially in combination with using tobacco. °· Physical inactivity. °· A diet that is high in fats, salt (sodium), and calories. °· Excessive alcohol use. °· Use of illegal drugs, especially cocaine and methamphetamine. °· Being African American. °· Being over the age of 55 years. °· Family history of stroke. °· Previous history of blood clots, stroke, TIA (transient ischemic attack), or heart attack. °· Sickle cell disease. °SIGNS AND SYMPTOMS °These symptoms usually develop suddenly, or you may notice them after waking up from sleep. Symptoms may include sudden: °· Weakness or numbness in your face, arm, or leg, especially on one side of your body. °· Confusion. °· Trouble speaking (aphasia) or understanding speech. °· Trouble seeing with one or both eyes. °· Trouble walking or difficulty moving your arms or legs. °· Dizziness. °· Loss of balance or coordination. °· Severe headache with no known cause.  The headache is often described as the worst headache ever experienced. °DIAGNOSIS °Your health care provider can often determine the presence or absence of an ischemic stroke based on your symptoms, history, and physical exam. CT (computed tomography) of the brain is usually performed to confirm the stroke, determine causes, and determine stroke severity. Other tests may be done to find the cause of the stroke. These tests may include: °· ECG (electrocardiogram). °· Continuous heart monitoring. °· Echocardiogram. °· Carotid ultrasound. °· MRI. °· A scan of the brain circulation. °· Blood tests. °TREATMENT °It is very important to seek treatment at the first sign of stroke symptoms. Your health care provider may perform the following treatments within 6 hours of the onset of stroke symptoms: °· Medicine to dissolve the blood clot (thrombolytic). °· Inserting a device into the affected artery to remove the blood clot. °These treatments may not be effective if too much time has passed since your stroke symptoms began. Even if you do not know when your symptoms began, get treatment as soon as possible. There are other treatment options that may be given, such as: °· Oxygen. °· IV fluids. °· Medicines to thin the blood (anticoagulants). °· A procedure to widen blocked arteries. °Your treatment will depend on how long you have had your symptoms, the severity of your symptoms, and the cause of your symptoms. °Your health care provider will take measures to prevent short-term and long-term complications of stroke, such as: °· Breathing foreign material into the lungs (aspiration pneumonia). °· Blood clots in the legs. °· Bedsores. °· Falls. °Medicines and dietary changes may be used to help treat and manage risk factors for   stroke, such as diabetes and high blood pressure. °If any of your body's functions were impaired by stroke, you may work with physical, speech, or occupational therapists to help you recover. °HOME CARE  INSTRUCTIONS °· Take medicines only as directed by your health care provider. Follow the directions carefully. Medicines may be used to control risk factors for a stroke. Be sure that you understand all your medicine instructions. °· If swallow studies have determined that your swallowing reflex is present, you should eat healthy foods. Foods may need to be a soft or pureed consistency, or you may need to take small bites in order to avoid aspirating or choking. °· Follow physical activity guidelines as directed by your health care team. °· Do not use any tobacco products, including cigarettes, chewing tobacco, or electronic cigarettes. If you smoke, quit. If you need help quitting, ask your health care provider. °· Limit or stop alcohol use. °· A safe home environment is important to reduce the risk of falls. Your health care provider may arrange for specialists to evaluate your home. Having grab bars in the bedroom and bathroom is often important. Your health care provider may arrange for equipment to be used at home, such as raised toilets and a seat for the shower. °· Ongoing physical, occupational, and speech therapy may be needed to maximize your recovery after a stroke. If you have been advised to use a walker or a cane, use it at all times. Be sure to keep your therapy appointments. °· Keep all follow-up visits with your health care provider. This is very important. This includes any referrals, therapy, rehabilitation, and lab tests. Proper follow-up can prevent another stroke from occurring. °PREVENTION °The risk of a stroke can be decreased by appropriately treating high blood pressure, high cholesterol, diabetes, heart disease, and obesity. It can also be decreased by quitting smoking, limiting alcohol, and staying physically active. °SEEK IMMEDIATE MEDICAL CARE IF: °· You have sudden weakness or numbness in your face, arm, or leg, especially on one side of your body. °· You have sudden confusion. °· You  have sudden trouble speaking (aphasia) or understanding. °· You have sudden trouble seeing with one or both eyes. °· You have sudden trouble walking or difficulty moving your arms or legs. °· You have sudden dizziness. °· You have a sudden loss of balance or coordination. °· You have a sudden, severe headache with no known cause. °· You have a partial or total loss of consciousness. °Any of these symptoms may represent a serious problem that is an emergency. Do not wait to see if the symptoms will go away. Get medical help right away. Call your local emergency services (911 in [place] Do not drive yourself to the hospital. °  °This information is not intended to replace advice given to you by your health care provider. Make sure you discuss any questions you have with your health care provider. °  °Document Released: 05/16/2014 Document Reviewed: 05/16/2014 °[employer] Interactive Patient Education ©2016 [employer] ° °

## 2016-04-11 NOTE — Evaluation (Signed)
Speech Language Pathology Evaluation Patient Details Name: James Schultz MRN: [id number] DOB: [birth year] Today's Date: 04/11/2016 Time: [id number] SLP Time Calculation (min) (ACUTE ONLY): 28 min  Problem List:  Patient Active Problem List   Diagnosis Date Noted  . Osteoarthritis of left hip 04/09/2016  . Stroke ([place]) 04/09/2016  . TIA (transient ischemic attack) 04/08/2016  . Anxiety 04/08/2016  . Depression 05/14/2013  . Paralysis agitans ([place]) 05/14/2013  . Essential hypertension, benign 05/14/2013  . Femur fracture ([place]) 05/14/2013  . Closed right hip fracture ([place]) 04/13/2013  . Unspecified essential hypertension 03/05/2013  . Sleep apnea 03/05/2013  . Parkinson's disease ([place]) 03/05/2013  . OA (osteoarthritis) of knee 03/04/2013   Past Medical History:  Past Medical History:  Diagnosis Date  . Anxiety   . Cervical spondylosis   . Degenerative disc disease, cervical   . Depression   . Headache(784.0)   . Hypertension   . OA (osteoarthritis)   . OSA (obstructive sleep apnea)    SEVERE OSA PER STUDY 2005--  USES NASAL CANNULA  WITH SETTING AT 12 -- NO MASK  . Parkinson's disease ([place]) NEUROLOGIST--  DR Jim Like   IN A STUDY AT [facility]--- lov note crae everywhere 02-19-2013 dr Kalman Shan scott  . Right ureteral stone   . Spinal stenosis of lumbar region   . Urinary incontinence    SECONDARY TO PARKINSON'S DISEASE  . Wears glasses    Past Surgical History:  Past Surgical History:  Procedure Laterality Date  . CHOLECYSTECTOMY  2000  . CYSTOSCOPY WITH RETROGRADE PYELOGRAM, URETEROSCOPY AND STENT PLACEMENT Right 11/13/2013   Procedure: CYSTOSCOPY WITH RIGHT URETEROSCOPY, Right Retrograde Pyelogram, RIGHT URETERAL STENT PLACEMENT, Basket Stone Extraction;  Surgeon: Ardis Hughs, MD;  Location: [facility];  Service: Urology;  Laterality: Right;  . HIP PINNING,CANNULATED Right 04/13/2013   Procedure: CANNULATED HIP PINNING;  Surgeon: Johnn Hai, MD;   Location: [facility];  Service: Orthopedics;  Laterality: Right;  PERCUTANEOUS CANNULATED RIGHT HIP PINNING   . HOLMIUM LASER APPLICATION Right [id number]   Procedure: RIGHT LASER LITHOTRIPSY;  Surgeon: Ardis Hughs, MD;  Location: [facility];  Service: Urology;  Laterality: Right;  . KNEE ARTHROSCOPY W/ DEBRIDEMENT Bilateral RIGHT  04-07-2008/   LEFT 10-13-2005  . LUMBAR LAMINECTOMY  01-05-2010   L2  --  L5  . TOTAL KNEE ARTHROPLASTY Left 03/04/2013   Procedure: LEFT TOTAL KNEE ARTHROPLASTY;  Surgeon: Gearlean Alf, MD;  Location: [facility];  Service: Orthopedics;  Laterality: Left;   HPI:  Pt is a 65 y.o. male who has a PMH of anxiety, depression, hypertension, headache, osteoarthritis, obstructive sleep apnea on CPAP, Parkinson's disease, urolithasis, and spinal stenosis. MRI of the brain confirms acute right pontine stroke.   Assessment / Plan / Recommendation Clinical Impression  Pt was given the Cognistat and showed a mild-mod cognitive impairment. Pt showed significant difficulty with memory retrieval task. Pt needed min cueing, processing time, and repetitions for comprehension, calculations, and reasoning tasks. Pt demonstrated adequate sustained attention, emergent awareness and ability to follow 1 step commands. Recommend cognitive tx to maximize pt's performance.  Of note: SLP observed pt cough after drinking water from a straw. With pt's history of Parkinson's disease and recent pontine stroke BSE may be beneficial.     SLP Assessment  Patient needs continued Speech Lanaguage Pathology Services    Follow Up Recommendations  Inpatient Rehab    Frequency and Duration min 2x/week  2 weeks  SLP Evaluation Prior Functioning  Cognitive/Linguistic Baseline: Information not available (pt states wife helps with meds and finances) Type of Home: House  Lives With: Spouse Available Help at Discharge: Family;Available 24 hours/day   Cognition  Overall Cognitive  Status: Impaired/Different from baseline Arousal/Alertness: Awake/alert Orientation Level: Oriented to person;Oriented to place;Disoriented to time;Disoriented to situation Attention: Sustained Sustained Attention: Impaired Sustained Attention Impairment: Verbal basic Memory: Impaired Memory Impairment: Decreased short term memory;Storage deficit;Retrieval deficit Decreased Short Term Memory: Verbal basic Awareness: Impaired Awareness Impairment: Emergent impairment Problem Solving: Impaired Problem Solving Impairment: Verbal basic;Functional basic Executive Function: Reasoning Reasoning: Impaired (abstract reasoning impaired) Reasoning Impairment: Verbal basic Safety/Judgment: Impaired (required cueing)    Comprehension  Auditory Comprehension Overall Auditory Comprehension: Appears within functional limits for tasks assessed (Simultaneous filing. User may not have seen previous data.) Yes/No Questions: Within Functional Limits (Simultaneous filing. User may not have seen previous data.) Commands:  (Simultaneous filing. User may not have seen previous data.) One Step Basic Commands: 75-100% accurate Two Step Basic Commands: 0-24% accurate Conversation: Simple Interfering Components: Processing speed EffectiveTechniques: Repetition;Extra processing time Visual Recognition/Discrimination Discrimination: Not tested Reading Comprehension Reading Status: Not tested    Expression Expression Primary Mode of Expression: Verbal Verbal Expression Overall Verbal Expression: Appears within functional limits for tasks assessed Initiation: No impairment Automatic Speech: Social Response Level of Generative/Spontaneous Verbalization: Sentence Repetition: No impairment Naming: No impairment Pragmatics: No impairment Effective Techniques: Semantic cues (Simultaneous filing. User may not have seen previous data.) Non-Verbal Means of Communication: Not applicable Written  Expression Written Expression: Not tested   Oral / Motor  Oral Motor/Sensory Function Overall Oral Motor/Sensory Function: Within functional limits Motor Speech Overall Motor Speech: Impaired Respiration: Within functional limits Phonation: Low vocal intensity Resonance: Within functional limits Articulation: Within functional limitis (Simultaneous filing. User may not have seen previous data.) Level of Impairment: Phrase Intelligibility: Intelligible (Simultaneous filing. User may not have seen previous data.) Phrase: 50-74% accurate Motor Planning: Witnin functional limits Motor Speech Errors: Not applicable Interfering Components: Premorbid status   GO           Ezekiel Slocumb, Student SLP         Shela Leff 04/11/2016, 1:53 PM
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
## 2016-04-11 NOTE — Consult Note (Signed)
Physical Medicine and Rehabilitation Consult Reason for Consult: Right pontine infarct Referring Physician: [facility]   HPI: James Schultz is a 65 y.o. right handed male with history of Parkinson's disease. Per chart review patient lives with spouse. One level home with a ramped entrance. Used a rolling walker for mobility but still was very limited. Wife reports spends most of his time in a recliner. Wife assist with lower body ADLs. Presented 04/08/2016 after reported syncopal episode with left facial droop while parked at [employer] parking lot. MRI showed right pontine infarct. MRA with no emergent large vessel occlusion or stenosis. Echocardiogram pending. Carotid Doppler showed no ICA stenosis. Did not receive TPA. Neurology follow-up currently on Plavix therapy for CVA prophylaxis. Subcutaneous Lovenox for DVT prophylaxis. Physical and occupational therapy evaluations completed with recommendations of physical medicine rehabilitation consult.   Review of Systems  Constitutional: Negative for chills and fever.  Eyes: Negative for blurred vision and double vision.  Respiratory: Negative for cough and shortness of breath.   Cardiovascular: Positive for leg swelling. Negative for chest pain and palpitations.  Gastrointestinal: Positive for constipation. Negative for nausea and vomiting.  Genitourinary:       Urinary incontinence  Musculoskeletal: Positive for back pain, joint pain and myalgias.  Neurological: Positive for dizziness, weakness and headaches.  Psychiatric/Behavioral: Positive for depression.       Anxiety  All other systems reviewed and are negative.  Past Medical History:  Diagnosis Date  . Anxiety   . Cervical spondylosis   . Degenerative disc disease, cervical   . Depression   . Headache(784.0)   . Hypertension   . OA (osteoarthritis)   . OSA (obstructive sleep apnea)    SEVERE OSA PER STUDY 2005--  USES NASAL CANNULA  WITH SETTING AT 12 -- NO MASK  .  Parkinson's disease ([place]) NEUROLOGIST--  DR Jim Like   IN A STUDY AT [facility]--- lov note crae everywhere 02-19-2013 dr Kalman Shan scott  . Right ureteral stone   . Spinal stenosis of lumbar region   . Urinary incontinence    SECONDARY TO PARKINSON'S DISEASE  . Wears glasses    Past Surgical History:  Procedure Laterality Date  . CHOLECYSTECTOMY  2000  . CYSTOSCOPY WITH RETROGRADE PYELOGRAM, URETEROSCOPY AND STENT PLACEMENT Right 11/13/2013   Procedure: CYSTOSCOPY WITH RIGHT URETEROSCOPY, Right Retrograde Pyelogram, RIGHT URETERAL STENT PLACEMENT, Basket Stone Extraction;  Surgeon: Ardis Hughs, MD;  Location: [facility];  Service: Urology;  Laterality: Right;  . HIP PINNING,CANNULATED Right 04/13/2013   Procedure: CANNULATED HIP PINNING;  Surgeon: Johnn Hai, MD;  Location: [facility];  Service: Orthopedics;  Laterality: Right;  PERCUTANEOUS CANNULATED RIGHT HIP PINNING   . HOLMIUM LASER APPLICATION Right [id number]   Procedure: RIGHT LASER LITHOTRIPSY;  Surgeon: Ardis Hughs, MD;  Location: [facility];  Service: Urology;  Laterality: Right;  . KNEE ARTHROSCOPY W/ DEBRIDEMENT Bilateral RIGHT  04-07-2008/   LEFT 10-13-2005  . LUMBAR LAMINECTOMY  01-05-2010   L2  --  L5  . TOTAL KNEE ARTHROPLASTY Left 03/04/2013   Procedure: LEFT TOTAL KNEE ARTHROPLASTY;  Surgeon: Gearlean Alf, MD;  Location: [facility];  Service: Orthopedics;  Laterality: Left;   Family History  Problem Relation Age of Onset  . Cancer Mother   . Parkinson's disease      H/O  . Alzheimer's disease      H/O  . ALS      H/O  Social History:  reports that he has never smoked. He has never used smokeless tobacco. He reports that he does not drink alcohol or use drugs. Allergies: No Known Allergies Medications Prior to Admission  Medication Sig Dispense Refill  . aspirin EC 81 MG tablet Take 81 mg by mouth every morning.    . carbidopa-levodopa (SINEMET CR) 50-200 MG per tablet Take 1  tablet by mouth at bedtime. 90 tablet 3  . carbidopa-levodopa (SINEMET IR) 25-100 MG per tablet Take 1 tablet by mouth 4 (four) times daily. May take an additional onehalf tab in the morning when you wake up to decrease freezing episode (Patient taking differently: Take 1.5-2 tablets by mouth See admin instructions. Take 2 tablets at 6 am then take 1 and 1/2 tablets at 10 am, 2 pm and 6 pm) 405 tablet 3  . escitalopram (LEXAPRO) 10 MG tablet Take 10 mg by mouth 2 (two) times daily.    Marland Kitchen lisinopril (PRINIVIL,ZESTRIL) 10 MG tablet Take 10 mg by mouth daily.     . ondansetron (ZOFRAN) 4 MG tablet Take 4 mg by mouth every 8 (eight) hours as needed for nausea or vomiting.    Marland Kitchen oxyCODONE-acetaminophen (PERCOCET/ROXICET) 5-325 MG tablet Take 1 tablet by mouth every 6 (six) hours as needed for severe pain.    . pramipexole (MIRAPEX) 1 MG tablet Take 1 tablet (1 mg total) by mouth 3 (three) times daily. (Patient taking differently: Take 0.5 mg by mouth 3 (three) times daily. ) 270 tablet 3  . rasagiline (AZILECT) 1 MG TABS tablet Take 1 tablet (1 mg total) by mouth every morning. 90 tablet 3  . tamsulosin (FLOMAX) 0.4 MG CAPS capsule Take 0.4 mg by mouth daily.    Marland Kitchen LORazepam (ATIVAN) 1 MG tablet Take 1 tablet (1 mg total) by mouth 4 (four) times daily as needed for anxiety. (Patient not taking: Reported on 04/08/2016) 30 tablet 0  . phenazopyridine (PYRIDIUM) 200 MG tablet Take 1 tablet (200 mg total) by mouth 3 (three) times daily as needed for pain. (Patient not taking: Reported on 04/08/2016) 10 tablet 0    Home: Home Living Family/patient expects to be discharged to:: Skilled nursing facility (potential CIR) Living Arrangements: Spouse/significant other Available Help at Discharge: Family, Available 24 hours/day Type of Home: House Home Access: Ramped entrance (+1 step into kitchen) Home Layout: One level Bathroom Shower/Tub: Multimedia programmer: Handicapped height Home Equipment: Estate manager/land agent, Grab bars - tub/shower, Environmental consultant - 2 wheels  Functional History: Prior Function Level of Independence: Needs assistance Gait / Transfers Assistance Needed: RW for mobility but limited. Wife reports pt spends most of his time in a recliner. ADL's / Homemaking Assistance Needed: Wife assists with LB ADL and fine motor tasks (buttons, etc). Pt does not do IADL. Functional Status:  Mobility: Bed Mobility Overal bed mobility: Needs Assistance, +2 for physical assistance Bed Mobility: Rolling, Sidelying to Sit, Sit to Sidelying Rolling: Mod assist, +2 for physical assistance Sidelying to sit: Max assist, +2 for physical assistance Sit to sidelying: Max assist, +2 for physical assistance General bed mobility comments: +2 maximal assist for bed mobility, increased physical assist for rolling both directions to sidyelying (hygiene and pericare performed) then +2 max assist to elevate to upright at EOB. Patient attempting the use of UEs to assist. Transfers Overall transfer level: Needs assistance Equipment used: 2 person hand held assist (face to face with gait belt ) Transfers: Sit to/from Stand Sit to Stand: Mod assist, +2 physical  assistance, From elevated surface General transfer comment: Patient required VCs for positioning and technique. increased time to perform. Require rocker technique due to PD deficits. moderate assist to power up and maintain stability Ambulation/Gait Ambulation/Gait assistance: Max assist, +2 physical assistance Ambulation Distance (Feet): 6 Feet Assistive device: 2 person hand held assist (face to face with wrap around via gait belt) Gait Pattern/deviations: Step-to pattern, Shuffle General Gait Details: patient required rocker step and max multi modal cues to initate step. Posterior list present. Patient with increased difficulty weight shifting requiring manual faciliatation.  Gait velocity: decreased Gait velocity interpretation: <1.8 ft/sec, indicative of  risk for recurrent falls    ADL: ADL Overall ADL's : Needs assistance/impaired Grooming: Min guard, Sitting, Cueing for safety, Cueing for sequencing Upper Body Bathing: Minimal assitance, Sitting Lower Body Bathing: Maximal assistance, Sit to/from stand, +2 for physical assistance Upper Body Dressing : Minimal assistance, Sitting Lower Body Dressing: Maximal assistance, +2 for physical assistance, Sit to/from stand Toilet Transfer Details (indicate cue type and reason): bed level at this time Toileting- Clothing Manipulation and Hygiene: Total assistance, Bed level, +2 for physical assistance Functional mobility during ADLs: Maximal assistance, +2 for physical assistance (for sit to stand only) General ADL Comments: Pt minimally interactive this session. Assisted RN with bed mobility during in and out cath and cleaning pt after BM.  Cognition: Cognition Overall Cognitive Status: Impaired/Different from baseline Orientation Level: Oriented to person, Oriented to place, Disoriented to time, Disoriented to situation Cognition Arousal/Alertness: Awake/alert Behavior During Therapy: Flat affect Overall Cognitive Status: Impaired/Different from baseline Area of Impairment: Following commands, Safety/judgement, Problem solving Following Commands: Follows one step commands consistently, Follows one step commands with increased time Safety/Judgement: Decreased awareness of safety, Decreased awareness of deficits Problem Solving: Slow processing, Decreased initiation, Requires verbal cues General Comments: Pt with flat affect and slow processing during activities. Wife reports pt has been slightly agitated; pt telling her to "shut up" which he reports he never says.  Blood pressure 122/72, pulse 78, temperature 98.3 F (36.8 C), temperature source Oral, resp. rate 18, weight 123.9 kg (273 lb 2.4 oz), SpO2 95 %. Physical Exam  Vitals reviewed. Constitutional: He appears well-developed and  well-nourished.  HENT:  Head: Normocephalic and atraumatic.  Eyes: Conjunctivae and EOM are normal.  Neck: Normal range of motion. Neck supple. No thyromegaly present.  Cardiovascular: Normal rate and regular rhythm.   Respiratory: Effort normal and breath sounds normal. No respiratory distress.  GI: Soft. Bowel sounds are normal. He exhibits no distension.  Musculoskeletal:  Tone increased with cogwheeling and rigidity  Neurological: He is alert.  Follow 3 step commands.  Able to name person, place and date of birth.  Fair awareness of deficits Motor: Not completely reliable due to participation with pt saying, "I can't give you full participation because I will poop on you" B/l UE ?4+/5, rigidity RLE: hip flexion 2/5, knee extension 3/5, ankle dorsi/plantar flexion 4/5 LLE: Hip flexion 4/5, knee extension 4/5, ankle doris/plantar flexion 4+/5  DTRs symmetric  Skin: Skin is warm and dry.  Psychiatric: His affect is blunt. His speech is slurred. He is slowed.  Altered affect    No results found for this or any previous visit (from the past 24 hour(s)). Dg Chest 1 View  Result Date: 04/09/2016 CLINICAL DATA:  TIA EXAM: CHEST 1 VIEW COMPARISON:  02/08/2016 FINDINGS: Cardiac enlargement with pulmonary vascular congestion. No edema or effusion Hypoventilation with mild bibasilar atelectasis IMPRESSION: Pulmonary vascular congestion Hypoventilation Electronically Signed  By: Franchot Gallo M.D.   On: 04/09/2016 07:43    Assessment/Plan: Diagnosis: Right pontine infarct Labs and images independently reviewed.  Records reviewed and summated above. Stroke: Continue secondary stroke prophylaxis and Risk Factor Modification listed below:   Antiplatelet therapy:   Blood Pressure Management:  Continue current medication with prn's with permisive HTN per primary team Statin Agent:   Diabetes management:   Right sided hemiparesis: fit for orthosis to prevent contractures (resting hand  splint for day, wrist cock up splint at night, PRAFO, etc) Motor recovery: Fluoxetine  1. Does the need for close, 24 hr/day medical supervision in concert with the patient's rehab needs make it unreasonable for this patient to be served in a less intensive setting? Yes 2. Co-Morbidities requiring supervision/potential complications: Parkinson's disease (Cont recs per study at [facility]), cervical spondylosis (monitor), depression (ensure mood does not hinder progress of therapies), OA (ensure pain does not limit therapies), severe OSA (monitor for daytime somnolence, cont CPAP), anxiety (ensure anxiety and resulting apprehension do not limit functional progress; consider prn medications if warranted) 3. Due to bladder management, safety, skin/wound care, disease management, medication administration and patient education, does the patient require 24 hr/day rehab nursing? Yes 4. Does the patient require coordinated care of a physician, rehab nurse, PT (1-2 hrs/day, 1-2 days/week) and OT (5 hrs/day, 1-2 days/week) to address physical and functional deficits in the context of the above medical diagnosis(es)? Yes Addressing deficits in the following areas: balance, endurance, locomotion, strength, transferring, bowel/bladder control, bathing, dressing, feeding, grooming, toileting, cognition, speech, language and psychosocial support 5. Can the patient actively participate in an intensive therapy program of at least 3 hrs of therapy per day at least 5 days per week? Yes 6. The potential for patient to make measurable gains while on inpatient rehab is excellent 7. Anticipated functional outcomes upon discharge from inpatient rehab are min assist and mod assist  with PT, min assist and mod assist with OT, min assist and mod assist with SLP. 8. Estimated rehab length of stay to reach the above functional goals is: 17-20 days. 9. Does the patient have adequate social supports and living environment to accommodate  these discharge functional goals? Potentially 10. Anticipated D/C setting: Home 11. Anticipated post D/C treatments: HH therapy and Home excercise program 12. Overall Rehab/Functional Prognosis: fair  RECOMMENDATIONS: This patient's condition is appropriate for continued rehabilitative care in the following setting: Would consider CIR to decrease burden of care if family accepting of reaching realistic goals (will cont to need assistance at discharge).  Patient has agreed to participate in recommended program. Potentially Note that insurance prior authorization may be required for reimbursement for recommended care.  Comment: Rehab Admissions Coordinator to follow up.  Delice Lesch, MD 04/11/2016

## 2016-04-11 NOTE — Interval H&P Note (Signed)
James Schultz was admitted today to Inpatient Rehabilitation with the diagnosis of right pontine infarct.  The patient's history has been reviewed, patient examined, and there is no change in status.  Patient continues to be appropriate for intensive inpatient rehabilitation.  I have reviewed the patient's chart and labs.  Questions were answered to the patient's satisfaction. The PAPE has been reviewed and assessment remains appropriate.  James Schultz 04/11/2016, 9:24 PM

## 2016-04-11 NOTE — Progress Notes (Signed)
Pt discharged and transferred to CIR via bed with RN accompanying him.

## 2016-04-11 NOTE — Progress Notes (Signed)
I met with pt and his wife at bedside to discuss an inpt rehab admission. I discussed with his wife that the patient will need physical assistance more than pta. She is aware and will have some assistance of family to assist. If pt does not progress to where she can manage at home, she will seek SNF after rehab. I contacted Dr. Algis Liming and will plan to admit pt today. RN CM and SW made aware. [phone]

## 2016-04-11 NOTE — Progress Notes (Signed)
*  PRELIMINARY RESULTS* Vascular Ultrasound Carotid Duplex (Doppler) has been completed.  Preliminary findings: Technically difficult due to thick neck/ unable to turn head.   Bilateral: No significant (1-39%) ICA stenosis. Antegrade vertebral flow.   Landry Mellow, RDMS, RVT  04/11/2016, 2:46 PM

## 2016-04-11 NOTE — Progress Notes (Signed)
James Gong, RN Rehab Admission Coordinator Signed Physical Medicine and Rehabilitation  PMR Pre-admission Date of Service: 04/11/2016 5:39 PM  Related encounter: ED to Hosp-Admission (Current) from 04/08/2016 in [place]       [] Hide copied text PMR Admission Coordinator Pre-Admission Assessment  Patient: James Schultz is an 65 y.o., male MRN: [id number] DOB: [birth year] Height:   Weight: 123.9 kg (273 lb 2.4 oz)                                                                                                                                                                                                                                                                          Insurance Information HMO:     PPO:      PCP:      IPA:      80/20: yes     OTHER:  No HMO PRIMARY: Medicare a and b      Policy#: [id number]      Subscriber: pt Benefits:  Phone #: passport one online     Name: 04/11/16 Eff. Date: 12/13/2008     Deduct: $1316      Out of Pocket Max: none      Life Max: none CIR: 100%      SNF: 20 full days Outpatient: 80%     Co-Pay: 20% Home Health: 100%      Co-Pay: none DME: 80%     Co-Pay: 20% Providers: pt choice  SECONDARY: [employer]      Policy#: [id number]      Subscriber: pt  Medicaid Application Date:       Case Manager:  Disability Application Date:       Case Worker:   Emergency [place]    Name Relation Home Work Mobile   [place] Spouse [id number]     Current Medical History  Patient Admitting Diagnosis:  Right pontine infarct; parkinsons  History of Present Illness:James L Blackwellis a 65 y.o.right handed malewith history of Parkinson's disease. Used a rolling walker for mobility but still was very limited. Wife reports spends most of his time in a recliner.  Wife assist with lower body ADLs. Presented 04/08/2016 after reported syncopal  episode with left facial droop while parked at [employer] parking lot. MRI showed right pontine infarct. MRA with no emergent large vessel occlusion or stenosis. Echocardiogram pending. Carotid Doppler showed no ICA stenosis. Did not receive TPA. Neurology follow-up currently on Plavix therapy for CVA prophylaxis. Subcutaneous Lovenox for DVT prophylaxis.   Total: 1 NIH    Past Medical History      Past Medical History:  Diagnosis Date  . Anxiety   . Cervical spondylosis   . Degenerative disc disease, cervical   . Depression   . Headache(784.0)   . Hypertension   . OA (osteoarthritis)   . OSA (obstructive sleep apnea)    SEVERE OSA PER STUDY 2005--  USES NASAL CANNULA  WITH SETTING AT 12 -- NO MASK  . Parkinson's disease (Parkman) NEUROLOGIST--  DR Jim Like   IN A STUDY AT [facility]--- lov note crae everywhere 02-19-2013 dr Kalman Shan scott  . Right ureteral stone   . Spinal stenosis of lumbar region   . Urinary incontinence    SECONDARY TO PARKINSON'S DISEASE  . Wears glasses     Family History  family history includes Cancer in his mother.  Prior Rehab/Hospitalizations:  Has the patient had major surgery during 100 days prior to admission? No Previous SNF rehab after knee replacment and hip in 2014  Current Medications   Current Facility-Administered Medications:  .  atorvastatin (LIPITOR) tablet 20 mg, 20 mg, Oral, q1800, David L Rinehuls, PA-C, 20 mg at 04/10/16 1802 .  carbidopa-levodopa (SINEMET CR) 50-200 MG per tablet controlled release 1 tablet, 1 tablet, Oral, QHS, Reubin Milan, MD, 1 tablet at 04/10/16 2202 .  carbidopa-levodopa (SINEMET IR) 25-100 MG per tablet immediate release 1.5 tablet, 1.5 tablet, Oral, 3 times per day, Reubin Milan, MD, 1.5 tablet at 04/11/16 1404 .  carbidopa-levodopa (SINEMET IR) 25-100 MG per tablet immediate release 2 tablet, 2 tablet, Oral, Q0600, Reubin Milan, MD, 2 tablet at 04/11/16 0532 .  clopidogrel  (PLAVIX) tablet 75 mg, 75 mg, Oral, Daily, Rosalin Hawking, MD, 75 mg at 04/11/16 1058 .  enoxaparin (LOVENOX) injection 60 mg, 60 mg, Subcutaneous, Q24H, Reubin Milan, MD, 60 mg at 04/11/16 0825 .  escitalopram (LEXAPRO) tablet 10 mg, 10 mg, Oral, BID, Reubin Milan, MD, 10 mg at 04/11/16 1058 .  lisinopril (PRINIVIL,ZESTRIL) tablet 10 mg, 10 mg, Oral, Daily, Reubin Milan, MD, 10 mg at 04/11/16 1058 .  ondansetron (ZOFRAN) tablet 4 mg, 4 mg, Oral, Q8H PRN, Reubin Milan, MD .  oxyCODONE-acetaminophen (PERCOCET/ROXICET) 5-325 MG per tablet 1 tablet, 1 tablet, Oral, Q6H PRN, Reubin Milan, MD, 1 tablet at 04/10/16 2202 .  pramipexole (MIRAPEX) tablet 0.5 mg, 0.5 mg, Oral, TID, Reubin Milan, MD, 0.5 mg at 04/11/16 1418 .  rasagiline (AZILECT) tablet 1 mg, 1 mg, Oral, q morning - 10a, Reubin Milan, MD, 1 mg at 04/11/16 0532 .  senna-docusate (Senokot-S) tablet 2 tablet, 2 tablet, Oral, QHS PRN, Reubin Milan, MD, 2 tablet at 04/09/16 0353 .  sodium chloride flush (NS) 0.9 % injection 3 mL, 3 mL, Intravenous, Q12H, Reubin Milan, MD, 3 mL at 04/11/16 1000 .  tamsulosin (FLOMAX) capsule 0.4 mg, 0.4 mg, Oral, Daily, Reubin Milan, MD, 0.4 mg at 04/11/16 1057  Patients Current Diet: Diet Heart Room service appropriate? Yes; Fluid consistency: Thin Diet - low sodium heart healthy  Precautions /  Restrictions Precautions Precautions: Fall Restrictions Weight Bearing Restrictions: No   Has the patient had 2 or more falls or a fall with injury in the past year?Yes  Prior Activity Level Community (5-7x/wk): declining functional status over past 6 months. Left hip pain for which ortho MD recommended steorid injection the week of pt's admission. Used RW to bathroom alone at night. Wife provided supervision on RW during the day. Wife assisted with adls  Home Assistive Devices / Equipment Home Assistive Devices/Equipment: CPAP Home Equipment: Shower  seat, Grab bars - tub/shower, Environmental consultant - 2 wheels  Prior Device Use: Indicate devices/aids used by the patient prior to current illness, exacerbation or injury? Walker  Prior Functional Level Prior Function Level of Independence: Needs assistance Gait / Transfers Assistance Needed: RW for mobility but limited. Wife reports pt spends most of his time in a recliner. ADL's / Homemaking Assistance Needed: Wife assists with LB ADL and fine motor tasks (buttons, etc). Pt does not do IADL. Comments: Pt up to bathroom with RW at night alone, wife would provide CGA for pt to ambulate with RW during the day  Self Care: Did the patient need help bathing, dressing, using the toilet or eating?  Needed some help  Indoor Mobility: Did the patient need assistance with walking from room to room (with or without device)? Needed some help  Stairs: Did the patient need assistance with internal or external stairs (with or without device)? Needed some help  Functional Cognition: Did the patient need help planning regular tasks such as shopping or remembering to take medications? Needed some help  Current Functional Level Cognition Arousal/Alertness: Awake/alert Overall Cognitive Status: Impaired/Different from baseline Orientation Level: Oriented to person, Oriented to place, Disoriented to time, Disoriented to situation Following Commands: Follows one step commands with increased time Safety/Judgement: Decreased awareness of deficits General Comments: pt very slow to respond Attention: Sustained Sustained Attention: Impaired Sustained Attention Impairment: Verbal basic Memory: Impaired Memory Impairment: Decreased short term memory, Storage deficit, Retrieval deficit Decreased Short Term Memory: Verbal basic Awareness: Impaired Awareness Impairment: Emergent impairment Problem Solving: Impaired Problem Solving Impairment: Verbal basic, Functional basic Executive Function: Reasoning Reasoning:  Impaired (abstract reasoning impaired) Reasoning Impairment: Verbal basic Safety/Judgment: Impaired (required cueing)    Extremity Assessment (includes Sensation/Coordination) Upper Extremity Assessment: RUE deficits/detail, LUE deficits/detail RUE Deficits / Details: Strength, AROM, sensation WFL. RUE Coordination: decreased fine motor LUE Deficits / Details: Strength, AROM, sensation WFL. LUE Coordination: decreased fine motor  Lower Extremity Assessment: Generalized weakness, RLE deficits/detail, LLE deficits/detail RLE Deficits / Details: generalized weakness noted BLE RLE Coordination: decreased fine motor, decreased gross motor LLE Deficits / Details: generalized weakness noted BLE LLE Coordination: decreased fine motor, decreased gross motor   ADLs Overall ADL's : Needs assistance/impaired Grooming: Wash/dry hands, Wash/dry face, Applying deodorant, Brushing hair, Minimal assistance Upper Body Bathing: Minimal assitance, Cueing for sequencing, Sitting Lower Body Bathing: Moderate assistance, Maximal assistance Upper Body Dressing : Moderate assistance Lower Body Dressing: Maximal assistance, With adaptive equipment, Cueing for sequencing, Sit to/from stand Toilet Transfer Details (indicate cue type and reason):  (Pt. is Mod A with sit to stand and pivot to [facility] and back.) Toileting- Clothing Manipulation and Hygiene: Total assistance, Bed level, +2 for physical assistance Functional mobility during ADLs: Moderate assistance General ADL Comments:  (Pt. requries extra time to perform tasks. )   Mobility Overal bed mobility: Needs Assistance Bed Mobility: Rolling, Sidelying to Sit Rolling: Mod assist Sidelying to sit: Mod assist, +2 for physical assistance, HOB  elevated Sit to sidelying: Max assist, +2 for physical assistance General bed mobility comments: max directional v/c's to complete task, assist for trunk elevation   Transfers Overall transfer level: Needs  assistance Equipment used: 2 person hand held assist Transfers: Sit to/from Stand, Stand Pivot Transfers Sit to Stand: Mod assist Stand pivot transfers: Mod assist, +2 physical assistance General transfer comment: max directional v/c's to complete task due to delayed processing, significant increase in time. assist for walker management   Ambulation / Gait / Stairs / Wheelchair Mobility Ambulation/Gait Ambulation/Gait assistance: +2 physical assistance, Mod assist Ambulation Distance (Feet): 2 Feet Assistive device: Rolling walker (2 wheeled) Gait Pattern/deviations: Step-to pattern, Decreased stride length General Gait Details: pt able to march in place x 10 reps and then had a BM, transfered to [facility] with minAx2. After pt stood for hygiene attempted to ambulate and pt unable to take more than 4 steps despite maxA for weight shifting and v/c's to advance/sequence stepping pattern, pt unable to clear foot and repeated stated, i need to sit down, my back hurts Gait velocity: decreased Gait velocity interpretation: <1.8 ft/sec, indicative of risk for recurrent falls   Posture / Balance Balance Overall balance assessment: Needs assistance Sitting-balance support: No upper extremity supported, Feet supported Sitting balance-Leahy Scale: Fair Standing balance support: Bilateral upper extremity supported, During functional activity Standing balance-Leahy Scale: Poor Standing balance comment: pt stood x 2 min for hygiene, dependent on RW   Special needs/care consideration BiPAP/CPAP yes Skin intact Bowel mgmt: LBM 8/28 incontinent Bladder mgmt: incontinent Diabetic mgmt n/a   Previous Home Environment Living Arrangements: Spouse/significant other  Lives With: Spouse Available Help at Discharge: Family, Available 24 hours/day Type of Home: House Home Layout: One level Home Access: Ramped entrance Bathroom Shower/Tub: Multimedia programmer: Handicapped height Bathroom  Accessibility: Yes How Accessible: Accessible via walker [place]: No Additional Comments: had bars in bathrooms to assist   Discharge Living Setting Plans for Discharge Living Setting: Patient's home, Lives with (comment) (wife) Type of Home at Discharge: House Discharge Home Layout: One level Discharge Home Access: [place] entrance Discharge Bathroom Shower/Tub: Walk-in shower Discharge Bathroom Toilet: Handicapped height Discharge Bathroom Accessibility: Yes How Accessible: Accessible via walker Does the patient have any problems obtaining your medications?: No  Social/Family/Support Systems Patient Roles: Spouse, 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 Information: [place], wife Anticipated Caregiver: wife can provide min assist but has daughter to help after dtr works, and 69 yo grandson to assist Anticipated Ambulance person Information: see above Ability/Limitations of Caregiver: wife unemployed with history of back surgery and autoimmune disorder Caregiver Availability: 24/7 Discharge Plan Discussed with Primary Caregiver: Yes Is Caregiver In Agreement with Plan?: Yes Does Caregiver/Family have Issues with Lodging/Transportation while Pt is in Rehab?: No  Goals/Additional Needs Patient/Family Goal for Rehab: Min to mod assist with PT and OT supervision with SLP Expected length of stay: ELOS 17-20 days Pt/Family Agrees to Admission and willing to participate: Yes Program Orientation Provided & Reviewed with Pt/Caregiver Including Roles  & Responsibilities: Yes   Decrease burden of Care through IP rehab admission: Decrease number of caregivers, Bowel and bladder program and Patient/family education  Possible need for SNF placement upon discharge: pt and wife are aware that if pt unable to tolerate intensity or fails to progress to level that she can assist at home, that SNF rehab will be pursued.   Patient Condition: This patient's condition remains as documented  in the consult dated 04/11/2016, in which the Rehabilitation Physician determined  and documented that the patient's condition is appropriate for intensive rehabilitative care in an inpatient rehabilitation facility. Will admit to inpatient rehab today.   Preadmission Screen Completed By:  Cleatrice Burke, 04/11/2016 5:47 PM ______________________________________________________________________   Discussed status with Dr. Posey Pronto on 04/11/2016 at  1747 and received telephone approval for admission today.  Admission Coordinator:  Cleatrice Burke, time [id number] Date 04/11/2016       Cosigned by: Ankit Lorie Phenix, MD at 04/11/2016 6:05 PM  Revision History

## 2016-04-12 ENCOUNTER — Inpatient Hospital Stay (HOSPITAL_COMMUNITY): Payer: Medicare Other | Admitting: Speech Pathology

## 2016-04-12 ENCOUNTER — Inpatient Hospital Stay (HOSPITAL_COMMUNITY): Payer: Medicare Other | Admitting: Physical Therapy

## 2016-04-12 ENCOUNTER — Inpatient Hospital Stay (HOSPITAL_COMMUNITY): Payer: Medicare Other | Admitting: Occupational Therapy

## 2016-04-12 DIAGNOSIS — M1612 Unilateral primary osteoarthritis, left hip: Secondary | ICD-10-CM

## 2016-04-12 DIAGNOSIS — G20 Parkinson's disease: Secondary | ICD-10-CM

## 2016-04-12 DIAGNOSIS — I6350 Cerebral infarction due to unspecified occlusion or stenosis of unspecified cerebral artery: Secondary | ICD-10-CM

## 2016-04-12 DIAGNOSIS — N400 Enlarged prostate without lower urinary tract symptoms: Secondary | ICD-10-CM

## 2016-04-12 DIAGNOSIS — E876 Hypokalemia: Secondary | ICD-10-CM

## 2016-04-12 DIAGNOSIS — D72829 Elevated white blood cell count, unspecified: Secondary | ICD-10-CM

## 2016-04-12 DIAGNOSIS — F4323 Adjustment disorder with mixed anxiety and depressed mood: Secondary | ICD-10-CM

## 2016-04-12 DIAGNOSIS — I10 Essential (primary) hypertension: Secondary | ICD-10-CM

## 2016-04-12 LAB — COMPREHENSIVE METABOLIC PANEL
ALK PHOS: 51 U/L (ref 38–126)
ALT: <5 U/L — ABNORMAL LOW (ref 17–63)
ANION GAP: 10 (ref 5–15)
AST: 20 U/L (ref 15–41)
Albumin: 3.6 g/dL (ref 3.5–5.0)
BILIRUBIN TOTAL: 0.6 mg/dL (ref 0.3–1.2)
BUN: 15 mg/dL (ref 6–20)
CALCIUM: 8.8 mg/dL — AB (ref 8.9–10.3)
CO2: 25 mmol/L (ref 22–32)
Chloride: 105 mmol/L (ref 101–111)
Creatinine, Ser: 0.73 mg/dL (ref 0.61–1.24)
GFR calc Af Amer: >60 mL/min (ref >60)
Glucose, Bld: 118 mg/dL — ABNORMAL HIGH (ref 65–99)
POTASSIUM: 3.2 mmol/L — AB (ref 3.5–5.1)
Sodium: 140 mmol/L (ref 135–145)
TOTAL PROTEIN: 6.4 g/dL — AB (ref 6.5–8.1)

## 2016-04-12 LAB — CBC WITH DIFFERENTIAL/PLATELET
Basophils Absolute: 0 10*3/uL (ref 0.0–0.1)
Basophils Relative: 0 %
Eosinophils Absolute: 0.3 10*3/uL (ref 0.0–0.7)
Eosinophils Relative: 3 %
HEMATOCRIT: 41.2 % (ref 39.0–52.0)
HEMOGLOBIN: 13.7 g/dL (ref 13.0–17.0)
LYMPHS ABS: 1.7 10*3/uL (ref 0.7–4.0)
LYMPHS PCT: 16 %
MCH: 28.2 pg (ref 26.0–34.0)
MCHC: 33.3 g/dL (ref 30.0–36.0)
MCV: 84.8 fL (ref 78.0–100.0)
MONO ABS: 0.9 10*3/uL (ref 0.1–1.0)
MONOS PCT: 9 %
NEUTROS ABS: 8 10*3/uL — AB (ref 1.7–7.7)
Neutrophils Relative %: 72 %
Platelets: 174 10*3/uL (ref 150–400)
RBC: 4.86 MIL/uL (ref 4.22–5.81)
RDW: 13.8 % (ref 11.5–15.5)
WBC: 11 10*3/uL — ABNORMAL HIGH (ref 4.0–10.5)

## 2016-04-12 MED ORDER — POTASSIUM CHLORIDE 20 MEQ PO PACK
40.0000 meq | PACK | Freq: Every day | ORAL | Status: AC
  Administered 2016-04-12 – 2016-04-13 (×2): 40 meq via ORAL
  Filled 2016-04-12 (×2): qty 2

## 2016-04-12 MED FILL — Perflutren Lipid Microsphere IV Susp 1.1 MG/ML: INTRAVENOUS | Qty: 10 | Status: AC

## 2016-04-12 NOTE — Progress Notes (Addendum)
[place] PHYSICAL MEDICINE & REHABILITATION     PROGRESS NOTE  Subjective/Complaints:  Pt laying in bed this AM.  He slept well overnight.  He is ready to begin therapies and asks me to help him get up and get ready, so he can be prepared once therapies arrive.   ROS: Denies CP, SOB, N/V/D.  Objective: Vital Signs: Blood pressure (!) 148/81, pulse 78, temperature 97.9 F (36.6 C), temperature source Oral, resp. rate 18, height 5\' 10"  (1.778 m), weight 116.2 kg (256 lb 3.2 oz), SpO2 96 %. No results found.  Recent Labs  04/12/16 0649  WBC 11.0*  HGB 13.7  HCT 41.2  PLT 174    Recent Labs  04/12/16 0649  NA 140  K 3.2*  CL 105  GLUCOSE 118*  BUN 15  CREATININE 0.73  CALCIUM 8.8*   CBG (last 3)  No results for input(s): GLUCAP in the last 72 hours.  Wt Readings from Last 3 Encounters:  04/11/16 116.2 kg (256 lb 3.2 oz)  04/08/16 123.9 kg (273 lb 2.4 oz)  01/30/15 116.1 kg (256 lb)    Physical Exam:  BP (!) 148/81 (BP Location: Left Arm)   Pulse 78   Temp 97.9 F (36.6 C) (Oral)   Resp 18   Ht 5\' 10"  (1.778 m)   Wt 116.2 kg (256 lb 3.2 oz)   SpO2 96%   BMI 36.76 kg/m   Gen: NAD. Vital signs reviewed.  Eyes: EOMand Conj normal.  Cardiovascular: Normal rateand regular rhythm.  Respiratory: Effort normaland breath sounds normal. No respiratory distress.  GI: Soft. Bowel sounds are normal. He exhibits no distension.  Musculoskeletal:  Tone increased with cogwheeling and rigidity Neurological: He is alert and oriented.  Fair awareness of deficits Motor:  Limited by rigidity B/l UE 4+/5 LUE: 2+/5 hip flexion, 3+/5 knee extension, 4+/5 ankle dorsi/plantar flexion RUE: 4+/5 hip flexion, knee extension 4+/5, ankle dorsi/plantar flexion 4+/5 Skin: Skin is warmand dry Psychiatric: His affect is blunt. His speech is slurred. He is slowed.   Assessment/Plan: 1. Functional deficits secondary to right pontine infarct which require 3+ hours per day of  interdisciplinary therapy in a comprehensive inpatient rehab setting. Physiatrist is providing close team supervision and 24 hour management of active medical problems listed below. Physiatrist and rehab team continue to assess barriers to discharge/monitor patient progress toward functional and medical goals.  Function:  Bathing Bathing position      Bathing parts      Bathing assist        Upper Body Dressing/Undressing Upper body dressing                    Upper body assist        Lower Body Dressing/Undressing Lower body dressing                                  Lower body assist        Toileting Toileting          Toileting assist     Transfers Chair/bed transfer             Locomotion Ambulation           Wheelchair          Cognition Comprehension Comprehension assist level: Understands basic 50 - 74% of the time/ requires cueing 25 - 49% of the time  Expression Expression assist  level: Expresses basic 50 - 74% of the time/requires cueing 25 - 49% of the time. Needs to repeat parts of sentences.  Social Interaction Social Interaction assist level: Interacts appropriately 50 - 74% of the time - May be physically or verbally inappropriate.  Problem Solving Problem solving assist level: Solves basic 50 - 74% of the time/requires cueing 25 - 49% of the time  Memory Memory assist level: Recognizes or recalls 50 - 74% of the time/requires cueing 25 - 49% of the time    Medical Problem List and Plan: 1. Left-sided weakness secondary to right pontine infarct  Begin CIR 2.  DVT Prophylaxis/Anticoagulation: Subcutaneous Lovenox. Monitor platelet counts of a signs of bleeding 3. Pain Management/Cervical spondylosis: Oxycodone as needed 4. Mood/Depression/Anxiety: Lexapro 10 mg twice a day 5. Neuropsych: This patient is capable of making decisions on his own behalf. 6. Skin/Wound Care: Routine skin checks 7.  Fluids/Electrolytes/Nutrition: Routine I&O 8. Hypertension. Lisinopril 10 mg daily  Monitor with increased activity 9. Parkinson's disease. Sinemet as directed, Mirapex 0.5 mg 3 times a day,Azilect 1 mg daily. 10. BPH. Flomax 0.4 mg daily.   PVR 3 pending 11. Hyperlipidemia. Lipitor 12. OA left hip:   Pt was scheduled to have injection.    Will monitor 13. Severe OSA: CPAP 14. Hypokalemia:   K+ 3.2 on 8/29, supplemented  Will Cont to monitor 15. Leukocytosis  WBCs 11.0 on 8/29 (trending down)   Will cont to monitor   LOS (Days) 1 A FACE TO FACE EVALUATION WAS PERFORMED  James Schultz Lorie Phenix 04/12/2016 9:02 AM

## 2016-04-12 NOTE — Evaluation (Signed)
Physical Therapy Assessment and Plan  Patient Details  Name: James Schultz MRN: [id number] Date of Birth: 01/09/1951  PT Diagnosis: Abnormal posture, Abnormality of gait, Difficulty walking, Impaired cognition and Muscle weakness Rehab Potential: Good ELOS: 14-18 days   Today's Date: 04/12/2016 PT Individual Time: 1761-6073 PT Individual Time Calculation (min): 68 min     Problem List: Patient Active Problem List   Diagnosis Date Noted  . Adjustment disorder with mixed anxiety and depressed mood   . Primary osteoarthritis of left hip   . Hypokalemia   . Leukocytosis   . Right pontine CVA ([place]) 04/11/2016  . Cervical spondylosis   . HLD (hyperlipidemia)   . BPH (benign prostatic hyperplasia)   . Osteoarthritis of left hip 04/09/2016  . Stroke ([place]) 04/09/2016  . TIA (transient ischemic attack) 04/08/2016  . Anxiety 04/08/2016  . Depression 05/14/2013  . Paralysis agitans ([place]) 05/14/2013  . Benign essential HTN 05/14/2013  . Femur fracture ([place]) 05/14/2013  . Closed right hip fracture ([place]) 04/13/2013  . Unspecified essential hypertension 03/05/2013  . OSA (obstructive sleep apnea) 03/05/2013  . Parkinson's disease ([place]) 03/05/2013  . OA (osteoarthritis) of knee 03/04/2013    Past Medical History:  Past Medical History:  Diagnosis Date  . Anxiety   . Cervical spondylosis   . Degenerative disc disease, cervical   . Depression   . Headache(784.0)   . Hypertension   . OA (osteoarthritis)   . OSA (obstructive sleep apnea)    SEVERE OSA PER STUDY 2005--  USES NASAL CANNULA  WITH SETTING AT 12 -- NO MASK  . Parkinson's disease ([place]) NEUROLOGIST--  DR Jim Like   IN A STUDY AT [facility]--- lov note crae everywhere 02-19-2013 dr Kalman Shan scott  . Right ureteral stone   . Spinal stenosis of lumbar region   . Urinary incontinence    SECONDARY TO PARKINSON'S DISEASE  . Wears glasses    Past Surgical History:  Past Surgical History:  Procedure Laterality Date  .  CHOLECYSTECTOMY  2000  . CYSTOSCOPY WITH RETROGRADE PYELOGRAM, URETEROSCOPY AND STENT PLACEMENT Right 11/13/2013   Procedure: CYSTOSCOPY WITH RIGHT URETEROSCOPY, Right Retrograde Pyelogram, RIGHT URETERAL STENT PLACEMENT, Basket Stone Extraction;  Surgeon: Ardis Hughs, MD;  Location: [facility];  Service: Urology;  Laterality: Right;  . HIP PINNING,CANNULATED Right 04/13/2013   Procedure: CANNULATED HIP PINNING;  Surgeon: Johnn Hai, MD;  Location: [facility];  Service: Orthopedics;  Laterality: Right;  PERCUTANEOUS CANNULATED RIGHT HIP PINNING   . HOLMIUM LASER APPLICATION Right 02/12/625   Procedure: RIGHT LASER LITHOTRIPSY;  Surgeon: Ardis Hughs, MD;  [facility];  Service: Urology;  Laterality: Right;  . KNEE ARTHROSCOPY W/ DEBRIDEMENT Bilateral RIGHT  04-07-2008/   LEFT 10-13-2005  . LUMBAR LAMINECTOMY  01-05-2010   L2  --  L5  . TOTAL KNEE ARTHROPLASTY Left 03/04/2013   Procedure: LEFT TOTAL KNEE ARTHROPLASTY;  Surgeon: Gearlean Alf, MD;  Location: [facility];  Service: Orthopedics;  Laterality: Left;    Assessment & Plan Clinical Impression: Patient is a 65 y.o. year old male with recent admission to the hospital on 04/08/2016 after reported syncopal episode with left facial droop while parked at [employer] parking lot. MRI showed right pontine infarct.  Patient transferred to CIR on 04/11/2016 .   Patient currently requires max with mobility secondary to muscle weakness, decreased coordination and decreased sitting balance, decreased standing balance, decreased postural control and decreased balance strategies.  Prior to  hospitalization, patient was min with mobility and lived with Spouse in a House home.  Home access is 1Stairs to enter.  Patient will benefit from skilled PT intervention to maximize safe functional mobility, minimize fall risk and decrease caregiver burden for planned discharge home with 24 hour assist.  Anticipate patient  will benefit from follow up OP at discharge.  PT - End of Session Activity Tolerance: Tolerates 30+ min activity with multiple rests Endurance Deficit: Yes PT Assessment Rehab Potential (ACUTE/IP ONLY): Good PT Patient demonstrates impairments in the following area(s): Balance;Endurance;Motor;Pain;Safety PT Transfers Functional Problem(s): Bed Mobility;Bed to Chair;Car;Furniture PT Locomotion Functional Problem(s): Stairs;Wheelchair Mobility;Ambulation PT Plan PT Intensity: Minimum of 1-2 x/day ,45 to 90 minutes PT Frequency: 5 out of 7 days PT Duration Estimated Length of Stay: 14-18 days PT Treatment/Interventions: Ambulation/gait training;Community reintegration;DME/adaptive equipment instruction;Neuromuscular re-education;Stair training;UE/LE Strength taining/ROM;Wheelchair propulsion/positioning;UE/LE Coordination activities;Therapeutic Activities;Pain management;Functional electrical stimulation;Discharge planning;Balance/vestibular training;Cognitive remediation/compensation;Functional mobility training;Patient/family education;Splinting/orthotics;Therapeutic Exercise PT Transfers Anticipated Outcome(s): supervision PT Locomotion Anticipated Outcome(s): min A gait, supervision w/c PT Recommendation Follow Up Recommendations: Home health PT;24 hour supervision/assistance;Outpatient PT Patient destination: Home Equipment Recommended: To be determined  Skilled Therapeutic Intervention Pt performed rolling in bed with use of bed rails and mod A to roll, total A to don pants in supine. Sidelying to sit with +2 assist with cues for UE placement and tactile cues at hips and UE.  Squat pivot transfer to w/c with max A for pivot of hips, pt unable to pivot on feet to transfer. Gait with RW 78' with pt with shuffling gait, difficulty bringing Rt foot forward, mod A for wt shifts and cues for staying close to RW.  Stair negotiation with 2 handrails with +2 assist for safety. Pt requires cues for  safety with stepping up and down but able to pull up relying heavily on UEs.  W/c mobility with supervision with B UEs for 30', limited by fatigue.  Sit to stands in stedy to practice as nursing will use this to transfer pt at this time. Pt able to pull to stand in stedy with mod A. Pt fatigued at end of session, asking to end session early. Pt c/o feeling "dizzy". BP 133/69, RN aware.  PT Evaluation Precautions/Restrictions Precautions Precautions: Fall Restrictions Weight Bearing Restrictions: No Pain Pain Assessment Pain Assessment: No/denies pain Home Living/Prior Functioning Home Living Available Help at Discharge: Family;Available 24 hours/day Type of Home: House Home Access: Stairs to enter [employer] of Steps: 1 Entrance Stairs-Rails: Right Home Layout: One level  Lives With: Spouse Prior Function Level of Independence: Needs assistance with ADLs;Requires assistive device for independence  Able to Take Stairs?: Yes Driving: No Vocation: Retired Comments: required RW for gait, wife helped with lower body dressing, pt states he needed help "sometimes" for gait  Cognition Arousal/Alertness: Awake/alert Orientation Level: Oriented to person;Oriented to place Sustained Attention: Impaired Sustained Attention Impairment: Functional basic Awareness: Impaired Awareness Impairment: Emergent impairment Behaviors:  (flat affect) Sensation Sensation Light Touch: Appears Intact Proprioception: Appears Intact Coordination Gross Motor Movements are Fluid and Coordinated: No Coordination and Movement Description: shuffling gait, narrow BOS Motor  Motor Motor: Abnormal postural alignment and control Motor - Skilled Clinical Observations: flexed posture   Trunk/Postural Assessment  Cervical Assessment Cervical Assessment:  (fwd head) Thoracic Assessment Thoracic Assessment:  (rounded shoulders) Lumbar Assessment Lumbar Assessment:  (posterior pelvic  tilt) Postural Control Postural Control: Deficits on evaluation Righting Reactions: delayed  Balance Dynamic Sitting Balance Sitting balance - Comments: requires assist to prevent posteior LOB  Dynamic Standing Balance Dynamic Standing - Comments: min A for standing balance with AD Extremity Assessment      RLE Assessment RLE Assessment:  (grossly 3-/5) LLE Assessment LLE Assessment:  (grossly 3-/5)   See Function Navigator for Current Functional Status.   Refer to Care Plan for Long Term Goals  Recommendations for other services: None  Discharge Criteria: Patient will be discharged from PT if patient refuses treatment 3 consecutive times without medical reason, if treatment goals not met, if there is a change in medical status, if patient makes no progress towards goals or if patient is discharged from hospital.  The above assessment, treatment plan, treatment alternatives and goals were discussed and mutually agreed upon: by patient  [facility] 04/12/2016, 9:47 AM

## 2016-04-12 NOTE — IPOC Note (Addendum)
Overall Plan of Care [facility]) Patient Details Name: James Schultz MRN: [id number] DOB: 02-14-1951  Admitting Diagnosis: CVA  Hospital Problems: Active Problems:   Right pontine CVA ([place])   Adjustment disorder with mixed anxiety and depressed mood   Primary osteoarthritis of left hip   Hypokalemia   Leukocytosis     Functional Problem List: Nursing Bladder, Bowel, Edema, Medication Management, Nutrition, Pain, Safety, Skin Integrity  PT Balance, Endurance, Motor, Pain, Safety  OT Balance, Cognition, Endurance, Motor, Safety  SLP Cognition  TR         Basic ADL's: OT Grooming, Bathing, Dressing, Toileting     Advanced  ADL's: OT       Transfers: PT Bed Mobility, Bed to Chair, Car, Manufacturing systems engineer, Metallurgist: PT Stairs, Emergency planning/management officer, Ambulation     Additional Impairments: OT None  SLP Social Cognition   Memory, Attention, Awareness  TR      Anticipated Outcomes Item Anticipated Outcome  Self Feeding not a goal  Swallowing      Basic self-care  Mod assist  Toileting  Min assist   Bathroom Transfers Min assist  Bowel/Bladder  mod assist  Transfers  supervision  Locomotion  min A gait, supervision w/c  Communication     Cognition  Min assist   Pain  less<3  Safety/Judgment  supervision   Therapy Plan: PT Intensity: Minimum of 1-2 x/day ,45 to 90 minutes PT Frequency: 5 out of 7 days PT Duration Estimated Length of Stay: 14-18 days OT Intensity: Minimum of 1-2 x/day, 45 to 90 minutes OT Frequency: 5 out of 7 days OT Duration/Estimated Length of Stay: 14-18 days SLP Intensity: Minumum of 1-2 x/day, 30 to 90 minutes SLP Frequency: 3 to 5 out of 7 days SLP Duration/Estimated Length of Stay: 17-20 days        Team Interventions: Nursing Interventions Patient/Family Education, Bladder Management, Bowel Management, Disease Management/Prevention, Pain Management, Medication Management, Skin Care/Wound Management,  Cognitive Remediation/Compensation, Dysphagia/Aspiration Precaution Training, Discharge Planning, Psychosocial Support  PT interventions Ambulation/gait training, Community reintegration, DME/adaptive equipment instruction, Neuromuscular re-education, Stair training, UE/LE Strength taining/ROM, Wheelchair propulsion/positioning, UE/LE Coordination activities, Therapeutic Activities, Pain management, Functional electrical stimulation, Discharge planning, Training and development officer, Cognitive remediation/compensation, Functional mobility training, Patient/family education, Splinting/orthotics, Therapeutic Exercise  OT Interventions Balance/vestibular training, Cognitive remediation/compensation, Discharge planning, Disease mangement/prevention, DME/adaptive equipment instruction, Functional mobility training, Neuromuscular re-education, Pain management, Patient/family education, Psychosocial support, Self Care/advanced ADL retraining, Skin care/wound managment, Therapeutic Activities, Therapeutic Exercise, UE/LE Strength taining/ROM, UE/LE Coordination activities, Visual/perceptual remediation/compensation  SLP Interventions Cognitive remediation/compensation, Cueing hierarchy, Environmental controls, Internal/external aids, Patient/family education  TR Interventions    SW/CM Interventions Discharge Planning, Psychosocial Support, Patient/Family Education    Team Discharge Planning: Destination: PT-Home ,OT- Home , SLP-Home Projected Follow-up: PT-Home health PT, 24 hour supervision/assistance, Outpatient PT, OT-  Home health OT, 24 hour supervision/assistance, SLP-24 hour supervision/assistance, Home Health SLP Projected Equipment Needs: PT-To be determined, OT- To be determined, SLP-None recommended by SLP Equipment Details: PT- , OT-  Patient/family involved in discharge planning: PT- Patient,  OT-Patient, SLP-   MD ELOS: 15-18 days. Medical Rehab Prognosis:  Fair Assessment: 65 y.o.right handed  malewith history of Parkinson's disease. One level home with a ramped entrance. Used a rolling walker for mobility but still was very limited. Wife reports spends most of his time in a recliner. Wife assist with lower body ADLs. Presented 04/08/2016 after reported syncopal episode with left facial droop while parked at [facility]  club parking lot. MRI showed right pontine infarct. MRA with no emergent large vessel occlusion or stenosis. Carotid Doppler showed no ICA stenosis. Did not receive TPA. Neurology follow-up currently on Plavix therapy for CVA prophylaxis. Now with sundowning. Pt with resulting deficits with left sided weakness > LLE, self-care, cognition, and transfers. Will set goals for Min A with PT/SLP, Min/Mod A with OT.   See Team Conference Notes for weekly updates to the plan of care

## 2016-04-12 NOTE — Progress Notes (Signed)
Patient information reviewed and entered into eRehab system by Mirabelle Cyphers, RN, CRRN, PPS Coordinator.  Information including medical coding and functional independence measure will be reviewed and updated through discharge.     Per nursing patient was given "Data Collection Information Summary for Patients in Inpatient Rehabilitation Facilities with attached "Privacy Act Statement-Health Care Records" upon admission.  

## 2016-04-12 NOTE — Evaluation (Signed)
Speech Language Pathology Assessment and Plan  Patient Details  Name: James Schultz MRN: [id number] Date of Birth: 16-Oct-1950  SLP Diagnosis: Cognitive Impairments  Rehab Potential: Fair ELOS: 17-20 days     Today's Date: 04/12/2016 SLP Individual Time: 1305-1404 SLP Individual Time Calculation (min): 59 min    Problem List: Patient Active Problem List   Diagnosis Date Noted  . Adjustment disorder with mixed anxiety and depressed mood   . Primary osteoarthritis of left hip   . Hypokalemia   . Leukocytosis   . Right pontine CVA ([place]) 04/11/2016  . Cervical spondylosis   . HLD (hyperlipidemia)   . BPH (benign prostatic hyperplasia)   . Osteoarthritis of left hip 04/09/2016  . Stroke ([place]) 04/09/2016  . TIA (transient ischemic attack) 04/08/2016  . Anxiety 04/08/2016  . Depression 05/14/2013  . Paralysis agitans ([place]) 05/14/2013  . Benign essential HTN 05/14/2013  . Femur fracture ([place]) 05/14/2013  . Closed right hip fracture ([place]) 04/13/2013  . Unspecified essential hypertension 03/05/2013  . OSA (obstructive sleep apnea) 03/05/2013  . Parkinson's disease ([place]) 03/05/2013  . OA (osteoarthritis) of knee 03/04/2013   Past Medical History:  Past Medical History:  Diagnosis Date  . Anxiety   . Cervical spondylosis   . Degenerative disc disease, cervical   . Depression   . Headache(784.0)   . Hypertension   . OA (osteoarthritis)   . OSA (obstructive sleep apnea)    SEVERE OSA PER STUDY 2005--  USES NASAL CANNULA  WITH SETTING AT 12 -- NO MASK  . Parkinson's disease ([place]) NEUROLOGIST--  DR Jim Like   IN A STUDY AT [facility]--- lov note crae everywhere 02-19-2013 dr Kalman Shan scott  . Right ureteral stone   . Spinal stenosis of lumbar region   . Urinary incontinence    SECONDARY TO PARKINSON'S DISEASE  . Wears glasses    Past Surgical History:  Past Surgical History:  Procedure Laterality Date  . CHOLECYSTECTOMY  2000  . CYSTOSCOPY WITH RETROGRADE PYELOGRAM,  URETEROSCOPY AND STENT PLACEMENT Right 11/13/2013   Procedure: CYSTOSCOPY WITH RIGHT URETEROSCOPY, Right Retrograde Pyelogram, RIGHT URETERAL STENT PLACEMENT, Basket Stone Extraction;  Surgeon: Ardis Hughs, MD;  Location: [facility];  Service: Urology;  Laterality: Right;  . HIP PINNING,CANNULATED Right 04/13/2013   Procedure: CANNULATED HIP PINNING;  Surgeon: Johnn Hai, MD;  Location: [facility];  Service: Orthopedics;  Laterality: Right;  PERCUTANEOUS CANNULATED RIGHT HIP PINNING   . HOLMIUM LASER APPLICATION Right 10/21/2503   Procedure: RIGHT LASER LITHOTRIPSY;  Surgeon: Ardis Hughs, MD;  Location: [facility];  Service: Urology;  Laterality: Right;  . KNEE ARTHROSCOPY W/ DEBRIDEMENT Bilateral RIGHT  04-07-2008/   LEFT 10-13-2005  . LUMBAR LAMINECTOMY  01-05-2010   L2  --  L5  . TOTAL KNEE ARTHROPLASTY Left 03/04/2013   Procedure: LEFT TOTAL KNEE ARTHROPLASTY;  Surgeon: Gearlean Alf, MD;  Location: [facility];  Service: Orthopedics;  Laterality: Left;    Assessment / Plan / Recommendation Clinical Impression   Tiffany Talarico Blackwellis a 65 y.o.right handed malewith history of Parkinson's disease.  Presented 04/08/2016 after reported syncopal episode with left facial droop while parked at [employer] parking lot. MRI showed right pontine infarct. Patient was admitted for comprehensive rehabilitation program 04/11/2016.  SLP evaluation completed on 04/12/2016 with the following results:  Pt presents with severe cognitive impairment characterized by fluctuating orientation, decreased sustained attention to tasks, and decreased safety awareness.  Pt also demonstrated impaired task  initiation and sequencing as well as poor motivation to participate in tasks.  Per pt's wife, pt has had a steady decline in overall physical and cognitive function over the last 6 months.  Question whether these deficits mentioned above are related primarily to CVA or secondary to delirium  and/or Parkinson's (wife reports pt becomes more confused if he gets off schedule with his medications).  Pt would benefit from skilled ST while inpatient in order to maximize functional independence and reduce burden of care prior to discharge.  Anticipate that pt will need 24/7 supervision at discharge.     Skilled Therapeutic Interventions          Cognitive-linguistic evaluation completed with results and recommendations reviewed with patient.  Pt was initially able to verbalize orientation information with supervision question cues; however, he intermittently reported that he was at a school and referred to staff members as students, school nurses, or principals.  He required frequent max assist verbal cues to reorient to place and situation.  Pt also required max assist verbal cues to sustain his attention to basic tasks for 1-2 minutes.  Pt repeatedly asked if it was time to go home.   Briefly discussed previous level of function with pt's wife who was present at the end of today's therapy session and informed her of goals of therapy.  Pt's wife verbalized understanding and in agreement with plan of care.      SLP Assessment  Patient will need skilled [facility] during CIR admission    Recommendations  Recommendations for Other Services: Neuropsych consult Patient destination: Home Follow up Recommendations: 24 hour supervision/assistance;Home Health SLP Equipment Recommended: None recommended by SLP    SLP Frequency 3 to 5 out of 7 days   SLP Duration  SLP Intensity  SLP Treatment/Interventions 17-20 days   Minumum of 1-2 x/day, 30 to 90 minutes  Cognitive remediation/compensation;Cueing hierarchy;Environmental controls;Internal/external aids;Patient/family education    Pain Pain Assessment Pain Assessment: No/denies pain  Prior Functioning Cognitive/Linguistic Baseline: Information not available Type of Home: House  Lives With: Spouse Available Help  at Discharge: Family;Available 24 hours/day Vocation: Retired  Function:  Eating Eating   Modified Consistency Diet: No Eating Assist Level: Supervision or verbal cues           Cognition Comprehension Comprehension assist level: Understands basic 50 - 74% of the time/ requires cueing 25 - 49% of the time  Expression   Expression assist level: Expresses basic 50 - 74% of the time/requires cueing 25 - 49% of the time. Needs to repeat parts of sentences.  Social Interaction Social Interaction assist level: Interacts appropriately 25 - 49% of time - Needs frequent redirection.  Problem Solving Problem solving assist level: Solves basic 25 - 49% of the time - needs direction more than half the time to initiate, plan or complete simple activities  Memory Memory assist level: Recognizes or recalls less than 25% of the time/requires cueing greater than 75% of the time   Short Term Goals: Week 1: SLP Short Term Goal 1 (Week 1): Pt will reorient to place, date, and situation with min assist verbal cues.   SLP Short Term Goal 2 (Week 1): Pt will sustain his attention to basic, familiar tasks for ~2-3 minutes with mod assist verbal cues.  SLP Short Term Goal 3 (Week 1): Pt will use call bell to request help as needed with mod assist multimodal cues.    Refer to Care Plan for Long Term Goals  Recommendations for other services: None  Discharge Criteria: Patient will be discharged from SLP if patient refuses treatment 3 consecutive times without medical reason, if treatment goals not met, if there is a change in medical status, if patient makes no progress towards goals or if patient is discharged from hospital.  The above assessment, treatment plan, treatment alternatives and goals were discussed and mutually agreed upon: by patient and by family  Emilio Math 04/12/2016, 3:32 PM

## 2016-04-12 NOTE — Evaluation (Signed)
Occupational Therapy Assessment and Plan  Patient Details  Name: James Schultz MRN: [id number] Date of Birth: 28-Jul-1951  OT Diagnosis: abnormal posture, cognitive deficits and muscle weakness (generalized) Rehab Potential: Rehab Potential (ACUTE ONLY): Fair ELOS: 14-18 days   Today's Date: 04/12/2016 OT Individual Time: 2563-8937 OT Individual Time Calculation (min): 58 min      Problem List: Patient Active Problem List   Diagnosis Date Noted  . Adjustment disorder with mixed anxiety and depressed mood   . Primary osteoarthritis of left hip   . Hypokalemia   . Leukocytosis   . Right pontine CVA ([place]) 04/11/2016  . Cervical spondylosis   . HLD (hyperlipidemia)   . BPH (benign prostatic hyperplasia)   . Osteoarthritis of left hip 04/09/2016  . Stroke ([place]) 04/09/2016  . TIA (transient ischemic attack) 04/08/2016  . Anxiety 04/08/2016  . Depression 05/14/2013  . Paralysis agitans ([place]) 05/14/2013  . Benign essential HTN 05/14/2013  . Femur fracture ([place]) 05/14/2013  . Closed right hip fracture ([place]) 04/13/2013  . Unspecified essential hypertension 03/05/2013  . OSA (obstructive sleep apnea) 03/05/2013  . Parkinson's disease ([place]) 03/05/2013  . OA (osteoarthritis) of knee 03/04/2013    Past Medical History:  Past Medical History:  Diagnosis Date  . Anxiety   . Cervical spondylosis   . Degenerative disc disease, cervical   . Depression   . Headache(784.0)   . Hypertension   . OA (osteoarthritis)   . OSA (obstructive sleep apnea)    SEVERE OSA PER STUDY 2005--  USES NASAL CANNULA  WITH SETTING AT 12 -- NO MASK  . Parkinson's disease ([place]) NEUROLOGIST--  DR Jim Like   IN A STUDY AT [facility]--- lov note crae everywhere 02-19-2013 dr Kalman Shan scott  . Right ureteral stone   . Spinal stenosis of lumbar region   . Urinary incontinence    SECONDARY TO PARKINSON'S DISEASE  . Wears glasses    Past Surgical History:  Past Surgical History:  Procedure Laterality Date  .  CHOLECYSTECTOMY  2000  . CYSTOSCOPY WITH RETROGRADE PYELOGRAM, URETEROSCOPY AND STENT PLACEMENT Right 11/13/2013   Procedure: CYSTOSCOPY WITH RIGHT URETEROSCOPY, Right Retrograde Pyelogram, RIGHT URETERAL STENT PLACEMENT, Basket Stone Extraction;  Surgeon: Ardis Hughs, MD;  Location[facility];  Service: Urology;  Laterality: Right;  . HIP PINNING,CANNULATED Right 04/13/2013   Procedure: CANNULATED HIP PINNING;  Surgeon: Johnn Hai, MD;  Location: [facility];  Service: Orthopedics;  Laterality: Right;  PERCUTANEOUS CANNULATED RIGHT HIP PINNING   . HOLMIUM LASER APPLICATION Right 10/17/2874   Procedure: RIGHT LASER LITHOTRIPSY;  Surgeon: Ardis Hughs, MD;  Location: [facility];  Service: Urology;  Laterality: Right;  . KNEE ARTHROSCOPY W/ DEBRIDEMENT Bilateral RIGHT  04-07-2008/   LEFT 10-13-2005  . LUMBAR LAMINECTOMY  01-05-2010   L2  --  L5  . TOTAL KNEE ARTHROPLASTY Left 03/04/2013   Procedure: LEFT TOTAL KNEE ARTHROPLASTY;  Surgeon: Gearlean Alf, MD;  Location: [facility];  Service: Orthopedics;  Laterality: Left;    Assessment & Plan Clinical Impression: Patient is a 65 y.o. right handed malewith history of Parkinson's disease. Per chart review patient lives with spouse. One level home with a ramped entrance. Used a rolling walker for mobility but still was very limited. Wife reports spends most of his time in a recliner. Wife assist with lower body ADLs. Presented 04/08/2016 after reported syncopal episode with left facial droop while parked at [employer] parking lot. MRI showed right pontine  infarct. MRA with no emergent large vessel occlusion or stenosis. Echocardiogram pending. Carotid Doppler showed no ICA stenosis. Did not receive TPA. Neurology follow-up currently on Plavix therapy for CVA prophylaxis. Subcutaneous Lovenox for DVT prophylaxis. Physical and occupational therapy evaluations completed with recommendations of physical medicine  rehabilitation consult..  Patient transferred to CIR on 04/11/2016 .    Patient currently requires max with basic self-care skills secondary to muscle weakness, abnormal tone, decreased awareness, decreased problem solving, decreased safety awareness and decreased memory and decreased sitting balance, decreased standing balance, decreased postural control and decreased balance strategies.  Prior to hospitalization, patient could complete ADLs with min assist with mobility and max-total assist with LB dressing.  Patient will benefit from skilled intervention to decrease level of assist with basic self-care skills prior to discharge home with care partner.  Anticipate patient will require 24 hour supervision and minimal physical assistance and follow up home health.  OT - End of Session Activity Tolerance: Tolerates 30+ min activity with multiple rests Endurance Deficit: Yes Endurance Deficit Description: required multiple rest breaks; nausea OT Assessment Rehab Potential (ACUTE ONLY): Fair OT Patient demonstrates impairments in the following area(s): Balance;Cognition;Endurance;Motor;Safety OT Basic ADL's Functional Problem(s): Grooming;Bathing;Dressing;Toileting OT Transfers Functional Problem(s): Toilet;Tub/Shower OT Additional Impairment(s): None OT Plan OT Intensity: Minimum of 1-2 x/day, 45 to 90 minutes OT Frequency: 5 out of 7 days OT Duration/Estimated Length of Stay: 14-18 days OT Treatment/Interventions: Balance/vestibular training;Cognitive remediation/compensation;Discharge planning;Disease mangement/prevention;DME/adaptive equipment instruction;Functional mobility training;Neuromuscular re-education;Pain management;Patient/family education;Psychosocial support;Self Care/advanced ADL retraining;Skin care/wound managment;Therapeutic Activities;Therapeutic Exercise;UE/LE Strength taining/ROM;UE/LE Coordination activities;Visual/perceptual remediation/compensation OT Self Feeding  Anticipated Outcome(s): not a goal OT Basic Self-Care Anticipated Outcome(s): Mod assist OT Toileting Anticipated Outcome(s): Min assist OT Bathroom Transfers Anticipated Outcome(s): Min assist OT Recommendation Recommendations for Other Services: Neuropsych consult Patient destination: Home Follow Up Recommendations: Home health OT;24 hour supervision/assistance Equipment Recommended: To be determined   Skilled Therapeutic Intervention OT eval completed with discussion of rehab process, OT purpose, POC, goals, and ELOS.  Pt with very limited participation during eval due to reports of nausea and declining toilet transfers and visual assessment due to nausea.  ADL assessment completed with bathing at sink with pt requiring increased cues and setup to engage in bathing.  Max assist sit <> stand with use of sink for UE support due to premorbid status of pulling up on objects.  Pt with intermittent orientation to situation and time, requiring cues to reorient throughout.  OT Evaluation Precautions/Restrictions  Precautions Precautions: Fall Precaution Comments: Parkinson's disease General   Vital Signs Therapy Vitals Pulse Rate: 97 BP: 107/69 Patient Position (if appropriate): Sitting Pain Pain Assessment Pain Assessment: No/denies pain Home Living/Prior Functioning Home Living Living Arrangements: Spouse/significant other Available Help at Discharge: Family, Available 24 hours/day Type of Home: House Home Access: Ramped entrance Entrance Stairs-Number of Steps: 1 Entrance Stairs-Rails: Right Home Layout: One level Bathroom Shower/Tub: Multimedia programmer: Handicapped height Bathroom Accessibility: Yes Additional Comments: had bars in bathrooms to assist   Lives With: Spouse IADL History Homemaking Responsibilities: No Prior Function Level of Independence: Needs assistance with ADLs, Requires assistive device for independence  Able to Take Stairs?: Yes Driving:  No Vocation: Retired Comments: required RW for gait, wife helped with lower body dressing, pt states he needed help "sometimes" for gait ADL  See Function Navigator Vision/Perception  Vision- History Baseline Vision/History: Cataracts (pt reports vision used to be bad but had surgery (unsure type) and now vision is "just less than perfect") Patient Visual Report:  No change from baseline Vision- Assessment Vision Assessment?: Vision impaired- to be further tested in functional context Additional Comments: difficult to assess due to impaired cognition  and reports of feeling sick therefore declining vision assessment.  However noted pt to have difficulty with visual scanning during functional tasks and tracking therapist  Cognition Overall Cognitive Status: Impaired/Different from baseline Arousal/Alertness: Awake/alert Orientation Level: Person;Place;Situation Person: Oriented Place: Disoriented (correctly identified when given a choice of 3) Situation: Oriented Year: 2017 Month: August Day of Week: Incorrect (Friday) Memory: Impaired Immediate Memory Recall: Sock;Blue;Bed Memory Recall: Blue;Bed Memory Recall Blue: With Cue (on 2nd guess) Memory Recall Bed: With Cue Sustained Attention: Impaired Sustained Attention Impairment: Functional basic;Verbal basic Awareness: Impaired Awareness Impairment: Emergent impairment Problem Solving: Impaired Behaviors:  (flat affect) Safety/Judgment: Impaired Sensation Sensation Light Touch: Appears Intact;Impaired by gross assessment (reports numbness/tingling in Lt thumb and first finger) Proprioception: Appears Intact Coordination Gross Motor Movements are Fluid and Coordinated: No Fine Motor Movements are Fluid and Coordinated: No Finger Nose Finger Test: slow and deliberate but symmetrical Extremity/Trunk Assessment RUE Assessment RUE Assessment: Within Functional Limits (strength grossly 4/5) LUE Assessment LUE Assessment: Within  Functional Limits (strength grossly 4/5)   See Function Navigator for Current Functional Status.   Refer to Care Plan for Long Term Goals  Recommendations for other services: Neuropsych  Discharge Criteria: Patient will be discharged from OT if patient refuses treatment 3 consecutive times without medical reason, if treatment goals not met, if there is a change in medical status, if patient makes no progress towards goals or if patient is discharged from hospital.  The above assessment, treatment plan, treatment alternatives and goals were discussed and mutually agreed upon: by patient  Simonne Come 04/12/2016, 2:13 PM

## 2016-04-12 NOTE — Progress Notes (Signed)
Social Work Assessment and Plan Social Work Assessment and Plan  Patient Details  Name: James Schultz MRN: [id number] Date of Birth: Feb 22, 1951  Today's Date: 04/12/2016  Problem List:  Patient Active Problem List   Diagnosis Date Noted  . Adjustment disorder with mixed anxiety and depressed mood   . Primary osteoarthritis of left hip   . Hypokalemia   . Leukocytosis   . Right pontine CVA ([place]) 04/11/2016  . Cervical spondylosis   . HLD (hyperlipidemia)   . BPH (benign prostatic hyperplasia)   . Osteoarthritis of left hip 04/09/2016  . Stroke ([place]) 04/09/2016  . TIA (transient ischemic attack) 04/08/2016  . Anxiety 04/08/2016  . Depression 05/14/2013  . Paralysis agitans ([place]) 05/14/2013  . Benign essential HTN 05/14/2013  . Femur fracture ([place]) 05/14/2013  . Closed right hip fracture ([place]) 04/13/2013  . Unspecified essential hypertension 03/05/2013  . OSA (obstructive sleep apnea) 03/05/2013  . Parkinson's disease ([place]) 03/05/2013  . OA (osteoarthritis) of knee 03/04/2013   Past Medical History:  Past Medical History:  Diagnosis Date  . Anxiety   . Cervical spondylosis   . Degenerative disc disease, cervical   . Depression   . Headache(784.0)   . Hypertension   . OA (osteoarthritis)   . OSA (obstructive sleep apnea)    SEVERE OSA PER STUDY 2005--  USES NASAL CANNULA  WITH SETTING AT 12 -- NO MASK  . Parkinson's disease ([place]) NEUROLOGIST--  DR Jim Like   IN A STUDY AT [facility]--- lov note crae everywhere 02-19-2013 dr Kalman Shan scott  . Right ureteral stone   . Spinal stenosis of lumbar region   . Urinary incontinence    SECONDARY TO PARKINSON'S DISEASE  . Wears glasses    Past Surgical History:  Past Surgical History:  Procedure Laterality Date  . CHOLECYSTECTOMY  2000  . CYSTOSCOPY WITH RETROGRADE PYELOGRAM, URETEROSCOPY AND STENT PLACEMENT Right 11/13/2013   Procedure: CYSTOSCOPY WITH RIGHT URETEROSCOPY, Right Retrograde Pyelogram, RIGHT URETERAL STENT  PLACEMENT, Basket Stone Extraction;  Surgeon: Ardis Hughs, MD;  Location[facility];  Service: Urology;  Laterality: Right;  . HIP PINNING,CANNULATED Right 04/13/2013   Procedure: CANNULATED HIP PINNING;  Surgeon: Johnn Hai, MD;  Location: [facility];  Service: Orthopedics;  Laterality: Right;  PERCUTANEOUS CANNULATED RIGHT HIP PINNING   . HOLMIUM LASER APPLICATION Right [id number]   Procedure: RIGHT LASER LITHOTRIPSY;  Surgeon: Ardis Hughs, MD;  Location: [facility];  Service: Urology;  Laterality: Right;  . KNEE ARTHROSCOPY W/ DEBRIDEMENT Bilateral RIGHT  04-07-2008/   LEFT 10-13-2005  . LUMBAR LAMINECTOMY  01-05-2010   L2  --  L5  . TOTAL KNEE ARTHROPLASTY Left 03/04/2013   Procedure: LEFT TOTAL KNEE ARTHROPLASTY;  Surgeon: Gearlean Alf, MD;  Location: [facility];  Service: Orthopedics;  Laterality: Left;   Social History:  reports that he has never smoked. He has never used smokeless tobacco. He reports that he does not drink alcohol or use drugs.  Family / Support Systems Marital Status: Married Patient Roles: Spouse, Parent Spouse/Significant Other: [place] [id number]  [phone] Children: Daughter will assist after work  Other Supports: Church members and friends Anticipated Caregiver: Wife has been assisting prior to admission and daughter alongwith grandson will assist Ability/Limitations of Caregiver: Wife does have back issues so only can provide min level Caregiver Availability: 24/7 Family Dynamics: Close knit family who rely upon one another. They have a big church family along with extended family who will pitch  in if needed.  Wife reports they have a good support network.  Social History Preferred language: English Religion: Christian Cultural Background: No issues Education: High School Read: Yes Write: Yes Employment Status: Disabled Freight forwarder Issues: No issues Guardian/Conservator: None-according  to MD pt is capable of making his own decisions, due to pt's confusion will make sure wif eis here if nay decision needs to be made while here.   Abuse/Neglect Physical Abuse: Denies Verbal Abuse: Denies Sexual Abuse: Denies Exploitation of patient/patient's resources: Denies Self-Neglect: Denies  Emotional Status Pt's affect, behavior adn adjustment status: Pt is wanting to get better and go home, he is more comfortable there. His wife is very calming to him and re-orients and re-directs him. He knows he needs to get to a higher level before going home for her so he is not so much care. Recent Psychosocial Issues: other health issues-parkinson's limits him the most Pyschiatric History: History depression/anxiety take medicaitons for this-wife feels it helps him. He may benefit from seeing neuro-psych while here. Will ask for input from the team. Substance Abuse History: No issues  Patient / Family Perceptions, Expectations & Goals Pt/Family understanding of illness & functional limitations: Pt at times knows he is here and other tiems does not. His wife is the one who talks with the MD and gets her questions answered and concerns addressed. Pt does utilize humor to get himself through.  Premorbid pt/family roles/activities: husband, father, retriee, church member, brother, grandfather, etc Anticipated changes in roles/activities/participation: resume Pt/family expectations/goals: Pt states: " I guess I am behaving it is a Research officer, trade union of opinion."  Wife states: " I want him to be walking and more mobile due to I have back issues."  [employer]: Other (Comment) (been to a NH in 2014) Premorbid Home Care/DME Agencies: Other (Comment) (has had HH in the past) Transportation available at discharge: Wife and daughter Resource referrals recommended: Neuropsychology, Support group (specify)  Discharge Planning Living Arrangements: Spouse/significant other Support Systems:  Spouse/significant other, Children, Other relatives, Friends/neighbors, Church/faith community Type of Residence: Private residence Insurance Resources: [employer], Multimedia programmer (specify) ([employer] [place]) Museum/gallery curator Resources: SSD, Family Support Financial Screen Referred: No Living Expenses: Own Money Management: Spouse Does the patient have any problems obtaining your medications?: No Home Management: Wife Patient/Family Preliminary Plans: Return home with wife who was assisitng him with his ADL's prior to admission, but pt was mobile with a rolling walker. Wife is wanting him to get mobile again if feasible, she will assist along with daughter andsoif necessary. Will await team evaluations. Social Work Anticipated Follow Up Needs: HH/OP, Support Group  Clinical Impression Pleasant humorous gentleman who tries to make jokes, to possibly cover his confusion. He at times is confused thinking he was at school instead of a hospital/rehab center. His wife is aware of this and expresses he was like this prior to admission. She was assisting with his ADL's and will again, but needs him more mobile. She plans to be here often and to see his progress. Awaiting team's evaluations and work on a safe discharge plan. Both aware of the alternatives due to was in a NH in 2014 after a hip and knee replacement.  Elease Hashimoto 04/12/2016, 2:07 PM

## 2016-04-12 NOTE — Care Management Note (Signed)
[place] Individual Statement of Services  Patient Name:  James Schultz  Date:  04/12/2016  Welcome to the [facility].  Our goal is to provide you with an individualized program based on your diagnosis and situation, designed to meet your specific needs.  With this comprehensive rehabilitation program, you will be expected to participate in at least 3 hours of rehabilitation therapies Monday-Friday, with modified therapy programming on the weekends.  Your rehabilitation program will include the following services:  Physical Therapy (PT), Occupational Therapy (OT), Speech Therapy (ST), 24 hour per day rehabilitation nursing, Therapeutic Recreaction (TR), Case Management (Social Worker), Rehabilitation Medicine, Nutrition Services and Pharmacy Services  Weekly team conferences will be held on Wednesday to discuss your progress.  Your Social Worker will talk with you frequently to get your input and to update you on team discussions.  Team conferences with you and your family in attendance may also be held.  Expected length of stay: 14-19 days  Overall anticipated outcome: supervision-cueing-PT and mod/min-OT goals  Depending on your progress and recovery, your program may change. Your Social Worker will coordinate services and will keep you informed of any changes. Your Social Worker's name and contact numbers are listed  below.  The following services may also be recommended but are not provided by the [facility]:   [place] will be made to provide these services after discharge if needed.  Arrangements include referral to agencies that provide these services.  Your insurance has been verified to be:  [place] primary doctor is:  Alanda Slim  Pertinent information will be shared with your doctor and your insurance  company.  Social Worker:  Ovidio Kin, [place] or [phone]  Information discussed with and copy given to patient by: Elease Hashimoto, 04/12/2016, 9:53 AM

## 2016-04-13 ENCOUNTER — Inpatient Hospital Stay (HOSPITAL_COMMUNITY): Payer: Medicare Other | Admitting: Occupational Therapy

## 2016-04-13 ENCOUNTER — Inpatient Hospital Stay (HOSPITAL_COMMUNITY): Payer: Medicare Other

## 2016-04-13 ENCOUNTER — Inpatient Hospital Stay (HOSPITAL_COMMUNITY): Payer: Medicare Other | Admitting: Speech Pathology

## 2016-04-13 ENCOUNTER — Ambulatory Visit: Payer: Medicare Other | Admitting: Neurology

## 2016-04-13 DIAGNOSIS — R410 Disorientation, unspecified: Secondary | ICD-10-CM

## 2016-04-13 MED ORDER — TRAZODONE HCL 50 MG PO TABS
50.0000 mg | ORAL_TABLET | Freq: Every day | ORAL | Status: DC
  Administered 2016-04-13 – 2016-04-19 (×7): 50 mg via ORAL
  Filled 2016-04-13 (×7): qty 1

## 2016-04-13 NOTE — Plan of Care (Signed)
Problem: RH SAFETY Goal: RH STG ADHERE TO SAFETY PRECAUTIONS W/ASSISTANCE/DEVICE STG Adhere to Safety Precautions With Mod Assistance/Device.   Outcome: Progressing Wife in room today

## 2016-04-13 NOTE — Plan of Care (Signed)
Problem: RH BLADDER ELIMINATION Goal: RH STG MANAGE BLADDER WITH ASSISTANCE STG Manage Bladder With Mod Assistance   Outcome: Not Progressing Condom cath at hs for incontinence

## 2016-04-13 NOTE — Progress Notes (Signed)
Physical Therapy Note  Patient Details  Name: James Schultz MRN: [id number] Date of Birth: May 22, 1951 Today's Date: 04/13/2016  1505-1605, 60 min individual tx Pain: none noted  Family ed with wife observing for biomechanics for bed mobility, transfers.  W/c propulsion using bil UEs x 150'.  Gait over level tile iwht wide RW.  Simulated car transfer to sedan ht seat.  Toilet transfer for continent urination. PT gave pt 's wife Home Measurements Form. Pt left resting in w/c with quick release belt applied and all needs within reach. Reviewed falls precautions with pt. Wife present.  Cheryl Chay 04/13/2016, 7:52 AM

## 2016-04-13 NOTE — Progress Notes (Signed)
Social Work Elease Hashimoto, LCSW Social Worker Signed   Patient Care Conference Date of Service: 04/13/2016  2:01 PM      Hide copied text Hover for attribution information Inpatient RehabilitationTeam Conference and Plan of Care Update Date: 04/13/2016   Time: 10:45 Am      Patient Name: James Schultz      Medical Record Number: [id number]  Date of Birth: Feb 24, 1951 Sex: Male         Room/Bed: 4W12C/4W12C-01 Payor Info: Payor: MEDICARE / Plan: MEDICARE PART A AND B / Product Type: *No Product type* /     Admitting Diagnosis: CVA  Admit Date/Time:  04/11/2016  6:49 PM Admission Comments: No comment available    Primary Diagnosis:  <principal problem not specified> Principal Problem: <principal problem not specified>       Patient Active Problem List    Diagnosis Date Noted  . Disorientation    . Adjustment disorder with mixed anxiety and depressed mood    . Primary osteoarthritis of left hip    . Hypokalemia    . Leukocytosis    . Right pontine CVA (James Schultz) 04/11/2016  . Cervical spondylosis    . HLD (hyperlipidemia)    . BPH (benign prostatic hyperplasia)    . Osteoarthritis of left hip 04/09/2016  . Stroke (James Schultz) 04/09/2016  . TIA (transient ischemic attack) 04/08/2016  . Anxiety 04/08/2016  . Depression 05/14/2013  . Paralysis agitans ([place]) 05/14/2013  . Benign essential HTN 05/14/2013  . Femur fracture ([place]) 05/14/2013  . Closed right hip fracture ([place]) 04/13/2013  . Unspecified essential hypertension 03/05/2013  . OSA (obstructive sleep apnea) 03/05/2013  . Parkinson's disease ([place]) 03/05/2013  . OA (osteoarthritis) of knee 03/04/2013      Expected Discharge Date: Expected Discharge Date: 04/21/16   Team Members Present: Physician leading conference: Dr. Delice Lesch Social Worker Present: Ovidio Kin, LCSW Nurse Present: Heather Roberts, RN PT Present: Georjean Mode, PT OT Present: Clyda Greener, OT SLP Present: Windell Moulding, SLP PPS Coordinator present :  Daiva Nakayama, RN, CRRN       Current Status/Progress Goal Weekly Team Focus  Medical   Left-sided weakness secondary to right pontine infarct with hx of Parkinson's  Improve BP, safety, mobility with assistice devices, disorientation  see above   Bowel/Bladder   incontinent of b&b, condom cath tonight (8/29) patient very confused & uncooperative  better control of bowel & bladder  monitor   Swallow/Nutrition/ Hydration             ADL's   max assist overall, +2 for toileting and toilet transfers  Min assist overall, no LB dressing due to wife completing for pt  activity tolerance, sit > stand, ADL retraining   Mobility   max A  supervision/min A gait with min A, w/c with supervision  transfers, balance, gait   Communication             Safety/Cognition/ Behavioral Observations severe cognitive impairment; max assist for basic   min assist   orientation, sustained attention, safety awareness   Pain   occassional pain, tylenol adequate   minimal pain  assess & offer pain meds    Skin   MASD to buttock, rash to scrotum  no further breakdown  monitor       *See Care Plan and progress notes for long and short-term goals.   Barriers to Discharge: Safety, mobility, transfers, disorientation, labile BP, BPH, hypokalemia, leukocytosis   Possible Resolutions to Barriers:  Follow labs, PVRs, therapies, reorient   Discharge Planning/Teaching Needs:  Home with wife who is limited due to her back issues, she was assisting with his ADL's prior to admission. She will also have help from her daughter and grandson.      Team Discussion:  Goals of min-supervision level. Confusion is better today than yesterday. Check PVR's and adjusting BP meds. More parkinson's issues than CVA. Wears condom cath at night. Wife assisted with ADL's prior to admission. Wife here to observe in therapies.  Revisions to Treatment Plan:  None    Continued Need for Acute Rehabilitation Level of Care: The patient requires  daily medical management by a physician with specialized training in physical medicine and rehabilitation for the following conditions: Daily direction of a multidisciplinary physical rehabilitation program to ensure safe treatment while eliciting the highest outcome that is of practical value to the patient.: Yes Daily medical management of patient stability for increased activity during participation in an intensive rehabilitation regime.: Yes Daily analysis of laboratory values and/or radiology reports with any subsequent need for medication adjustment of medical intervention for : Neurological problems;Blood pressure problems;Mood/behavior problems;Other   Elease Hashimoto 04/13/2016, 2:01 PM       Patient ID: James Schultz, male   DOB: May 31, 1951, 65 y.o.   MRN: [id number]

## 2016-04-13 NOTE — Progress Notes (Signed)
Occupational Therapy Session Note  Patient Details  Name: James Schultz MRN: [id number] Date of Birth: 09-Jul-1951  Today's Date: 04/13/2016 OT Individual Time: [phone] OT Individual Time Calculation (min): 78 min     Short Term Goals: Week 1:  OT Short Term Goal 1 (Week 1): Pt will complete bathing at sit > stand level in room shower with min assist OT Short Term Goal 2 (Week 1): Pt will complete toilet tranfers with mod assist of one caregiver OT Short Term Goal 3 (Week 1): Pt will complete UB dressing with mod assist OT Short Term Goal 4 (Week 1): Pt will complete sit > stand for LB dressing with mod assist of one caregiver  Skilled Therapeutic Interventions/Progress Updates:    Pt completed transfer from supine to sit EOB with mod assist and HOB elevated with use of rails.  Pt's spouse reports that his bed at home has adjustable head but that he does not have rails.  Used steady for transfer from EOB to the toilet and shower seat secondary to needing max assist on eval.  Pt confused unable to recall place, time, or situation.  When therapist entered room for session pt stated that he thought this was his room from home.  He needed max assist for all aspects of toileting but could complete sit to stand transitions with use of the steady with supervision and sit to stand from wheelchair and shower seat with min assist.  Mod facilitation for stand pivot transfer from the shower chair back to the wheelchair.  Pt with slow movements throughout but completed 90% of UB bathing and 50% of LB bathing.  He needed mod assist for donning pullover shirt with max assist for donning brief, shorts, TEDs, and shoes.  Pt left in room with spouse and nursing present.  Safety belt in place.  Pt still disoriented and confused stating,  " They put one of these belts on me the other day when I went to [facility]." Therapy Documentation Precautions:  Precautions Precautions: Fall Precaution  Comments: Parkinson's disease Restrictions Weight Bearing Restrictions: No  Pain: Pain Assessment Pain Assessment: No/denies pain ADL: See Function Navigator for Current Functional Status.   Therapy/Group: Individual Therapy  Elizah Lydon OTR/L 04/13/2016, 9:56 AM

## 2016-04-13 NOTE — Patient Care Conference (Signed)
Inpatient RehabilitationTeam Conference and Plan of Care Update Date: 04/13/2016   Time: 10:45 Am    Patient Name: James Schultz      Medical Record Number: [id number]  Date of Birth: 1950/12/20 Sex: Male         Room/Bed: 4W12C/4W12C-01 Payor Info: Payor: MEDICARE / Plan: MEDICARE PART A AND B / Product Type: *No Product type* /    Admitting Diagnosis: CVA  Admit Date/Time:  04/11/2016  6:49 PM Admission Comments: No comment available   Primary Diagnosis:  <principal problem not specified> Principal Problem: <principal problem not specified>  Patient Active Problem List   Diagnosis Date Noted  . Disorientation   . Adjustment disorder with mixed anxiety and depressed mood   . Primary osteoarthritis of left hip   . Hypokalemia   . Leukocytosis   . Right pontine CVA ([place]) 04/11/2016  . Cervical spondylosis   . HLD (hyperlipidemia)   . BPH (benign prostatic hyperplasia)   . Osteoarthritis of left hip 04/09/2016  . Stroke ([place]) 04/09/2016  . TIA (transient ischemic attack) 04/08/2016  . Anxiety 04/08/2016  . Depression 05/14/2013  . Paralysis agitans ([place]) 05/14/2013  . Benign essential HTN 05/14/2013  . Femur fracture ([place]) 05/14/2013  . Closed right hip fracture ([place]) 04/13/2013  . Unspecified essential hypertension 03/05/2013  . OSA (obstructive sleep apnea) 03/05/2013  . Parkinson's disease ([place]) 03/05/2013  . OA (osteoarthritis) of knee 03/04/2013    Expected Discharge Date: Expected Discharge Date: 04/21/16  Team Members Present: Physician leading conference: Dr. Delice Lesch Social Worker Present: Ovidio Kin, LCSW Nurse Present: Heather Roberts, RN PT Present: Georjean Mode, PT OT Present: Clyda Greener, OT SLP Present: Windell Moulding, SLP PPS Coordinator present : Daiva Nakayama, RN, CRRN     Current Status/Progress Goal Weekly Team Focus  Medical   Left-sided weakness secondary to right pontine infarct with hx of Parkinson's  Improve BP, safety, mobility with  assistice devices, disorientation  see above   Bowel/Bladder   incontinent of b&b, condom cath tonight (8/29) patient very confused & uncooperative  better control of bowel & bladder  monitor   Swallow/Nutrition/ Hydration             ADL's   max assist overall, +2 for toileting and toilet transfers  Min assist overall, no LB dressing due to wife completing for pt  activity tolerance, sit > stand, ADL retraining   Mobility   max A  supervision/min A gait with min A, w/c with supervision  transfers, balance, gait   Communication             Safety/Cognition/ Behavioral Observations  severe cognitive impairment; max assist for basic   min assist   orientation, sustained attention, safety awareness   Pain   occassional pain, tylenol adequate   minimal pain  assess & offer pain meds    Skin   MASD to buttock, rash to scrotum  no further breakdown  monitor      *See Care Plan and progress notes for long and short-term goals.  Barriers to Discharge: Safety, mobility, transfers, disorientation, labile BP, BPH, hypokalemia, leukocytosis    Possible Resolutions to Barriers:  Follow labs, PVRs, therapies, reorient    Discharge Planning/Teaching Needs:  Home with wife who is limited due to her back issues, she was assisting with his ADL's prior to admission. She will also have help from her daughter and grandson.      Team Discussion:  Goals of min-supervision level. Confusion is  better today than yesterday. Check PVR's and adjusting BP meds. More parkinson's issues than CVA. Wears condom cath at night. Wife assisted with ADL's prior to admission. Wife here to observe in therapies.  Revisions to Treatment Plan:  None   Continued Need for Acute Rehabilitation Level of Care: The patient requires daily medical management by a physician with specialized training in physical medicine and rehabilitation for the following conditions: Daily direction of a multidisciplinary physical  rehabilitation program to ensure safe treatment while eliciting the highest outcome that is of practical value to the patient.: Yes Daily medical management of patient stability for increased activity during participation in an intensive rehabilitation regime.: Yes Daily analysis of laboratory values and/or radiology reports with any subsequent need for medication adjustment of medical intervention for : Neurological problems;Blood pressure problems;Mood/behavior problems;Other  Italia Wolfert, Gardiner Rhyme 04/13/2016, 2:01 PM

## 2016-04-13 NOTE — Progress Notes (Signed)
Speech Language Pathology Daily Session Note  Patient Details  Name: James Schultz MRN: [id number] Date of Birth: [birth year]  Today's Date: 04/13/2016 SLP Individual Time: 1001-1030 SLP Individual Time Calculation (min): 29 min   Short Term Goals:Week 1: SLP Short Term Goal 1 (Week 1): Pt will reorient to place, date, and situation with min assist verbal cues.   SLP Short Term Goal 2 (Week 1): Pt will sustain his attention to basic, familiar tasks for ~2-3 minutes with mod assist verbal cues.  SLP Short Term Goal 3 (Week 1): Pt will use call bell to request help as needed with mod assist multimodal cues.    Skilled Therapeutic Interventions: Pt was seen for skilled ST targeting cognitive goals.  Pt was oriented to place, date, and situation with min question cues.  Pt was able to recall at least 1 detail from previous AM therapy session with min assist.  Therapist introduced pt's therapy schedule to facilitate improved orientation and recall of daily information.  Pt was able to utilize schedule with min assist verbal cues.  Pt returned demonstration of use of call bell with min-mod assist verbal cues.  Mod-max assist question cues needed to identify current limitations (both physical and cognitive) and their impact on his functional independence.  Pt was left in wheelchair with quick release belt donned and call bell within reach.  Continue per current plan of care.       Function:  Eating Eating                 Cognition Comprehension Comprehension assist level: Understands basic 75 - 89% of the time/ requires cueing 10 - 24% of the time  Expression   Expression assist level: Expresses basic 75 - 89% of the time/requires cueing 10 - 24% of the time. Needs helper to occlude trach/needs to repeat words.  Social Interaction Social Interaction assist level: Interacts appropriately 75 - 89% of the time - Needs redirection for appropriate language or to initiate interaction.  Problem  Solving Problem solving assist level: Solves basic 50 - 74% of the time/requires cueing 25 - 49% of the time  Memory Memory assist level: Recognizes or recalls 25 - 49% of the time/requires cueing 50 - 75% of the time    Pain Pain Assessment Pain Assessment: 0-10 Pain Score: 7  Pain Type: Acute pain Pain Location: Knee Pain Orientation: Left Pain Descriptors / Indicators: Aching Pain Intervention(s): RN made aware;Cold applied  Therapy/Group: Individual Therapy  Chaunice Obie, Selinda Orion 04/13/2016, 10:43 AM

## 2016-04-13 NOTE — Progress Notes (Addendum)
[place] PHYSICAL MEDICINE & REHABILITATION     PROGRESS NOTE  Subjective/Complaints:  Pt resting comfortably in bed this AM.  Per nursing, pt was very restless overnight and confused.  His room had to be moved closer to the nurses station due to pt trying to climb out of bed.  His wife with questions about workup up on acute floor.   ROS: Denies CP, SOB, N/V/D.  Objective: Vital Signs: Blood pressure (!) 151/80, pulse 74, temperature 97.9 F (36.6 C), temperature source Oral, resp. rate 20, height 5\' 10"  (1.778 m), weight 116.2 kg (256 lb 3.2 oz), SpO2 97 %. No results found.  Recent Labs  04/12/16 0649  WBC 11.0*  HGB 13.7  HCT 41.2  PLT 174    Recent Labs  04/12/16 0649  NA 140  K 3.2*  CL 105  GLUCOSE 118*  BUN 15  CREATININE 0.73  CALCIUM 8.8*   CBG (last 3)  No results for input(s): GLUCAP in the last 72 hours.  Wt Readings from Last 3 Encounters:  04/11/16 116.2 kg (256 lb 3.2 oz)  04/08/16 123.9 kg (273 lb 2.4 oz)  01/30/15 116.1 kg (256 lb)    Physical Exam:  BP (!) 151/80 (BP Location: Left Arm)   Pulse 74   Temp 97.9 F (36.6 C) (Oral)   Resp 20   Ht 5\' 10"  (1.778 m)   Wt 116.2 kg (256 lb 3.2 oz)   SpO2 97%   BMI 36.76 kg/m   Gen: NAD. Vital signs reviewed.  Eyes: EOMand Conj normal.  Cardiovascular: Normal rateand regular rhythm.  Respiratory: Effort normaland breath sounds normal. No respiratory distress.  GI: Soft. Bowel sounds are normal. He exhibits no distension.  Musculoskeletal:  Tone increased with cogwheeling and rigidity Neurological: He is alert and oriented.  Fair awareness of deficits Motor:  Limited by rigidity B/l UE 4+/5 LUE: 2+/5 hip flexion, 3+/5 knee extension, 4+/5 ankle dorsi/plantar flexion RUE: 4+/5 hip flexion, knee extension 4+/5, ankle dorsi/plantar flexion 4+/5 Skin: Skin is warmand dry Psychiatric: His affect is blunt. His speech is slurred. He is slowed.   Assessment/Plan: 1. Functional deficits  secondary to right pontine infarct which require 3+ hours per day of interdisciplinary therapy in a comprehensive inpatient rehab setting. Physiatrist is providing close team supervision and 24 hour management of active medical problems listed below. Physiatrist and rehab team continue to assess barriers to discharge/monitor patient progress toward functional and medical goals.  Function:  Bathing Bathing position   Position: Wheelchair/chair at sink  Bathing parts Body parts bathed by patient: Right arm, Left arm, Chest, Abdomen, Left upper leg, Right upper leg Body parts bathed by helper: Front perineal area, Buttocks, Right lower leg, Left lower leg, Back  Bathing assist Assist Level:  (Mod assist)      Upper Body Dressing/Undressing Upper body dressing   What is the patient wearing?: Hospital gown                Upper body assist        Lower Body Dressing/Undressing Lower body dressing                                  Lower body assist        Toileting Toileting          Toileting assist     Transfers Chair/bed transfer     Chair/bed transfer assist level:  2 helpers Chair/bed transfer assistive device: Air cabin crew     Max distance: 30     Wheelchair     Max wheelchair distance: 25 Assist Level: Supervision or verbal cues  Cognition Comprehension Comprehension assist level: Understands basic 25 - 49% of the time/ requires cueing 50 - 75% of the time  Expression Expression assist level: Expresses basic 50 - 74% of the time/requires cueing 25 - 49% of the time. Needs to repeat parts of sentences.  Social Interaction Social Interaction assist level: Interacts appropriately 25 - 49% of time - Needs frequent redirection.  Problem Solving Problem solving assist level: Solves basic 25 - 49% of the time - needs direction more than half the time to initiate, plan or complete simple activities  Memory Memory assist level:  Recognizes or recalls 25 - 49% of the time/requires cueing 50 - 75% of the time    Medical Problem List and Plan: 1. Left-sided weakness secondary to right pontine infarct  Cont CIR 2.  DVT Prophylaxis/Anticoagulation: Subcutaneous Lovenox. Monitor platelet counts of a signs of bleeding 3. Pain Management/Cervical spondylosis: Oxycodone as needed 4. Mood/Depression/Anxiety: Lexapro 10 mg twice a day 5. Neuropsych: This patient is capable of making decisions on his own behalf. 6. Skin/Wound Care: Routine skin checks 7. Fluids/Electrolytes/Nutrition: Routine I&O 8. Hypertension. Lisinopril 10 mg daily  Monitor with increased activity  Labile at present 9. Parkinson's disease. Sinemet as directed, Mirapex 0.5 mg 3 times a day,Azilect 1 mg daily. 10. BPH. Flomax 0.4 mg daily.   PVR 3 remain pending 11. Hyperlipidemia. Lipitor 12. OA left hip:   Pt was scheduled to have injection as oupt, no complaints at present however.  Will monitor 13. Severe OSA: CPAP 14. Hypokalemia:   K+ 3.2 on 8/29, supplemented  Will Cont to monitor  Labs ordered for tomorrow 15. Leukocytosis  WBCs 11.0 on 8/29 (trending down)   Will cont to monitor 16. Disorientation  Will schedule trazodone to readjust sleep/wake cycle 17. Aortic valve  TEE as outpt   LOS (Days) 2 A FACE TO FACE EVALUATION WAS PERFORMED  Adysson Revelle Lorie Phenix 04/13/2016 8:50 AM

## 2016-04-13 NOTE — Progress Notes (Signed)
Social Work Patient ID: James Schultz, male   DOB: 1951/05/03, 65 y.o.   MRN: [id number]   Met with pt and wife to discuss team conference goals-supervision/min assist level and target discharge date 9/7. Pt was pleased with a date to shoot for and wife is in agreement with this plan. She is here to observe pt in therapies and it is also his birthday today. Wife was assisting with his care prior to admission. Would like [facility] since ha shad them before. Will work on discharge plans.

## 2016-04-13 NOTE — Progress Notes (Signed)
Speech Language Pathology Daily Session Note  Patient Details  Name: James Schultz MRN: [id number] Date of Birth: [birth year]  Today's Date: 04/13/2016 SLP Individual Time: [id number] SLP Individual Time Calculation (min): 30 min   Short Term Goals: Week 1: SLP Short Term Goal 1 (Week 1): Pt will reorient to place, date, and situation with min assist verbal cues.   SLP Short Term Goal 2 (Week 1): Pt will sustain his attention to basic, familiar tasks for ~2-3 minutes with mod assist verbal cues.  SLP Short Term Goal 3 (Week 1): Pt will use call bell to request help as needed with mod assist multimodal cues.    Skilled Therapeutic Interventions:   Skilled treatment session focused on addressing cognition goals. SLP facilitated session by providing structured conversation regarding current events and Mod verbal cues for orientation to place; patent required Min cues for orientation to situation and was oriented to date given that today is his birthday.  Patient required Max assist question cues to identify reasons to utilize the call bell but did so Mod I .  Continue with current plan of care.    Function:  Cognition Comprehension Comprehension assist level: Understands basic 75 - 89% of the time/ requires cueing 10 - 24% of the time  Expression   Expression assist level: Expresses basic 75 - 89% of the time/requires cueing 10 - 24% of the time. Needs helper to occlude trach/needs to repeat words.  Social Interaction Social Interaction assist level: Interacts appropriately 75 - 89% of the time - Needs redirection for appropriate language or to initiate interaction.  Problem Solving Problem solving assist level: Solves basic 50 - 74% of the time/requires cueing 25 - 49% of the time  Memory Memory assist level: Recognizes or recalls 25 - 49% of the time/requires cueing 50 - 75% of the time    Pain Pain Assessment Pain Assessment: No/denies pain  Therapy/Group: Individual  Therapy  Carmelia Roller., [place] [id number]  [place] 04/13/2016, 1:55 PM

## 2016-04-14 ENCOUNTER — Inpatient Hospital Stay (HOSPITAL_COMMUNITY): Payer: Medicare Other | Admitting: Speech Pathology

## 2016-04-14 ENCOUNTER — Inpatient Hospital Stay (HOSPITAL_COMMUNITY): Payer: Medicare Other | Admitting: Physical Therapy

## 2016-04-14 ENCOUNTER — Inpatient Hospital Stay (HOSPITAL_COMMUNITY): Payer: Medicare Other | Admitting: Occupational Therapy

## 2016-04-14 LAB — BASIC METABOLIC PANEL
ANION GAP: 8 (ref 5–15)
BUN: 12 mg/dL (ref 6–20)
CO2: 27 mmol/L (ref 22–32)
Calcium: 8.9 mg/dL (ref 8.9–10.3)
Chloride: 106 mmol/L (ref 101–111)
Creatinine, Ser: 0.79 mg/dL (ref 0.61–1.24)
GLUCOSE: 107 mg/dL — AB (ref 65–99)
POTASSIUM: 2.9 mmol/L — AB (ref 3.5–5.1)
Sodium: 141 mmol/L (ref 135–145)

## 2016-04-14 LAB — CBC WITH DIFFERENTIAL/PLATELET
BASOS ABS: 0 10*3/uL (ref 0.0–0.1)
Basophils Relative: 0 %
Eosinophils Absolute: 0.3 10*3/uL (ref 0.0–0.7)
Eosinophils Relative: 4 %
HEMATOCRIT: 39.4 % (ref 39.0–52.0)
HEMOGLOBIN: 12.8 g/dL — AB (ref 13.0–17.0)
LYMPHS PCT: 25 %
Lymphs Abs: 2.1 10*3/uL (ref 0.7–4.0)
MCH: 27.7 pg (ref 26.0–34.0)
MCHC: 32.5 g/dL (ref 30.0–36.0)
MCV: 85.3 fL (ref 78.0–100.0)
MONO ABS: 0.9 10*3/uL (ref 0.1–1.0)
Monocytes Relative: 11 %
NEUTROS ABS: 4.8 10*3/uL (ref 1.7–7.7)
NEUTROS PCT: 60 %
Platelets: 213 10*3/uL (ref 150–400)
RBC: 4.62 MIL/uL (ref 4.22–5.81)
RDW: 14.1 % (ref 11.5–15.5)
WBC: 8.1 10*3/uL (ref 4.0–10.5)

## 2016-04-14 NOTE — Progress Notes (Signed)
RT placed patient on CPAP of 10 HS. NO O2 bleed in needed. Patient tolerating well.

## 2016-04-14 NOTE — Progress Notes (Signed)
Occupational Therapy Session Note  Patient Details  Name: James Schultz MRN: [id number] Date of Birth: May 10, 1951  Today's Date: 04/14/2016 OT Individual Time: 0800-0930 OT Individual Time Calculation (min): 90 min     Short Term Goals: Week 1:  OT Short Term Goal 1 (Week 1): Pt will complete bathing at sit > stand level in room shower with min assist OT Short Term Goal 2 (Week 1): Pt will complete toilet tranfers with mod assist of one caregiver OT Short Term Goal 3 (Week 1): Pt will complete UB dressing with mod assist OT Short Term Goal 4 (Week 1): Pt will complete sit > stand for LB dressing with mod assist of one caregiver  Skilled Therapeutic Interventions/Progress Updates:    Pt needed mod assist for transfer from supine with HOB up to sitting EOB.  Pt unable to scoot hips forward initially so therapist provided assist.  Min guard assist for sit to stand from the EOB with pt ambulating to the toilet and the walk-in shower with min assist using the RW.  He was able to bathe sit to stand with mod instructional cueing for sequencing and min assist for balance.  Provided LH sponge for washing his feet but therapist assisted with drying them.  Dressing sit to stand at the sink.  He was able to thread his underpants over both feet but needed assist to pull them over hips.  He was able to complete all aspects of donning shorts with min assist and use of the reacher to donn them over his left foot.  Shaving completed with min assist for thoroughness.  Therapist had to assist with pulling shirt over head and then down secondary to pt not being able to complete this.  Feel he may do this more independently with a larger shirt.  Completed session with PT taking over session.   Therapy Documentation Precautions:  Precautions Precautions: Fall Precaution Comments: Parkinson's disease Restrictions Weight Bearing Restrictions: No  Pain: Pain Assessment Pain Assessment: No/denies  pain ADL:  See Function Navigator for Current Functional Status.   Therapy/Group: Individual Therapy  Havah Ammon OTR/L 04/14/2016, 12:12 PM

## 2016-04-14 NOTE — Progress Notes (Signed)
Physical Therapy Note  Patient Details  Name: EFRAIM TEUBNER MRN: [id number] Date of Birth: [birth year] Today's Date: 04/14/2016    Time: [phone]  70 minutes (40 individual, 30 concurrent)  Pt with no c/o pain. W/c mobility in controlled environment with min cuing to use large UE motions to improve efficiency.  Furniture transfers to lower bed and sofa with mod A to stand from sofa due to LE weakness, min A to stand from bed.  Gait with RW in controlled environment with close supervision/occasional min A when pt fatigued as pt tends to shuffle his feet when fatigued, min cuing to stay close to RW.  Curb step training for strength and coordination multiple attempts with min A for stepping and min A for RW control with lifting and placing RW.  Standing bend and reach task with focus on trunk rotation, wt shifts and balance strategies with min A.  Seated bilat trunk PNFs with focus on improving trunk rotation and spinal mobility.  Nustep for UE/LE reciprocal movements 2 x 5 minutes level 4.    DONAWERTH,KAREN 04/14/2016, 11:23 AM

## 2016-04-14 NOTE — Progress Notes (Signed)
Speech Language Pathology Daily Session Note  Patient Details  Name: James Schultz MRN: [id number] Date of Birth: 1951-06-01  Today's Date: 04/14/2016 SLP Individual Time: 1406-1450 SLP Individual Time Calculation (min): 44 min   Short Term Goals: Week 1: SLP Short Term Goal 1 (Week 1): Pt will reorient to place, date, and situation with min assist verbal cues.   SLP Short Term Goal 2 (Week 1): Pt will sustain his attention to basic, familiar tasks for ~2-3 minutes with mod assist verbal cues.  SLP Short Term Goal 3 (Week 1): Pt will use call bell to request help as needed with mod assist multimodal cues.    Skilled Therapeutic Interventions: Pt was seen for skilled ST targeting cognitive goals.  Pt was oriented to place, date, and situation with min assist question cues.  Pt utilized daily schedule to recall events from previous therapy sessions with supervision question cues.  Pt required min assist for use of monthly calendar to count number of days until discharge.  Therapist facilitated the session with a novel card game targeting sustained attention to task.  Pt was able to sustain his attention to task for its duration (~15 minutes) with supervision verbal cues needed for redirection.  Pt was transferred back to bed with assist from RN and Stedy lift at the end of today's therapy session.  Left in bed with call bell within reach and bed alarm set.  Continue per current plan of care.    Function:  Eating Eating              Cognition Comprehension Comprehension assist level: Understands basic 90% of the time/cues < 10% of the time  Expression   Expression assist level: Expresses basic 90% of the time/requires cueing < 10% of the time.  Social Interaction Social Interaction assist level: Interacts appropriately 75 - 89% of the time - Needs redirection for appropriate language or to initiate interaction.  Problem Solving Problem solving assist level: Solves basic 75 - 89% of  the time/requires cueing 10 - 24% of the time  Memory Memory assist level: Recognizes or recalls 50 - 74% of the time/requires cueing 25 - 49% of the time    Pain Pain Assessment Pain Assessment: No/denies pain  Therapy/Group: Individual Therapy  Aiven Kampe, Selinda Orion 04/14/2016, 4:28 PM

## 2016-04-14 NOTE — Progress Notes (Signed)
PHYSICAL MEDICINE & REHABILITATION     PROGRESS NOTE  Subjective/Complaints:  No issues overnight Sitting up with occupational therapy, working on ADLs  ROS: Denies CP, SOB, N/V/D.  Objective: Vital Signs: Blood pressure (!) 99/51, pulse 81, temperature 98.3 F (36.8 C), temperature source Oral, resp. rate 18, height 5\' 10"  (1.778 m), weight 116.2 kg (256 lb 3.2 oz), SpO2 97 %. No results found.  Recent Labs  04/12/16 0649 04/14/16 0439  WBC 11.0* 8.1  HGB 13.7 12.8*  HCT 41.2 39.4  PLT 174 213    Recent Labs  04/12/16 0649 04/14/16 0439  NA 140 141  K 3.2* 2.9*  CL 105 106  GLUCOSE 118* 107*  BUN 15 12  CREATININE 0.73 0.79  CALCIUM 8.8* 8.9   CBG (last 3)  No results for input(s): GLUCAP in the last 72 hours.  Wt Readings from Last 3 Encounters:  04/11/16 116.2 kg (256 lb 3.2 oz)  04/08/16 123.9 kg (273 lb 2.4 oz)  01/30/15 116.1 kg (256 lb)    Physical Exam:  BP (!) 99/51 (BP Location: Right Arm)   Pulse 81   Temp 98.3 F (36.8 C) (Oral)   Resp 18   Ht 5\' 10"  (1.778 m)   Wt 116.2 kg (256 lb 3.2 oz)   SpO2 97%   BMI 36.76 kg/m   Gen: NAD. Vital signs reviewed.  Eyes: EOMand Conj normal.  Cardiovascular: Normal rateand regular rhythm.  Respiratory: Effort normaland breath sounds normal. No respiratory distress.  GI: Soft. Bowel sounds are normal. He exhibits no distension.  Musculoskeletal:  Tone increased with cogwheeling and rigidity Neurological: He is alert and oriented.  Fair awareness of deficits Motor:  Limited by rigidity B/l UE 4+/5 LUE: 2+/5 hip flexion, 3+/5 knee extension, 4+/5 ankle dorsi/plantar flexion RUE: 4+/5 hip flexion, knee extension 4+/5, ankle dorsi/plantar flexion 4+/5 Skin: Skin is warmand dry Psychiatric: His affect is blunt. His speech is slurred. He is slowed.   Assessment/Plan: 1. Functional deficits secondary to right pontine infarct which require 3+ hours per day of interdisciplinary therapy in  a comprehensive inpatient rehab setting. Physiatrist is providing close team supervision and 24 hour management of active medical problems listed below. Physiatrist and rehab team continue to assess barriers to discharge/monitor patient progress toward functional and medical goals.  Function:  Bathing Bathing position   Position: Shower  Bathing parts Body parts bathed by patient: Right arm, Left arm, Chest, Abdomen, Front perineal area, Right upper leg, Left upper leg, Right lower leg, Left lower leg Body parts bathed by helper: Back, Buttocks  Bathing assist Assist Level: Touching or steadying assistance(Pt > 75%)      Upper Body Dressing/Undressing Upper body dressing   What is the patient wearing?: Pull over shirt/dress     Pull over shirt/dress - Perfomed by patient: Thread/unthread right sleeve, Thread/unthread left sleeve Pull over shirt/dress - Perfomed by helper: Put head through opening, Pull shirt over trunk        Upper body assist        Lower Body Dressing/Undressing Lower body dressing   What is the patient wearing?: Pants, Shoes, [employer], Underwear Underwear - Performed by patient: Thread/unthread right underwear leg, Thread/unthread left underwear leg Underwear - Performed by helper: Pull underwear up/down Pants- Performed by patient: Thread/unthread right pants leg, Thread/unthread left pants leg, Pull pants up/down Pants- Performed by helper: Thread/unthread right pants leg, Thread/unthread left pants leg  Shoes - Performed by helper: Don/doff right shoe, Don/doff left shoe       TED Hose - Performed by helper: Don/doff right TED hose, Don/doff left TED hose  Lower body assist Assist for lower body dressing:  (patient completed 1/7, 14%, total assist)      Toileting Toileting Toileting activity did not occur: No continent bowel/bladder event Toileting steps completed by patient: Performs perineal hygiene, Adjust clothing prior to  toileting Toileting steps completed by helper: Adjust clothing after toileting    Toileting assist     Transfers Chair/bed transfer   Chair/bed transfer method: Stand pivot Chair/bed transfer assist level: Touching or steadying assistance (Pt > 75%) Chair/bed transfer assistive device: Mechanical lift Mechanical lift: Primary school teacher     Max distance: 150     Wheelchair   Type: Manual Max wheelchair distance: 150 Assist Level: Supervision or verbal cues  Cognition Comprehension Comprehension assist level: Understands basic 75 - 89% of the time/ requires cueing 10 - 24% of the time  Expression Expression assist level: Expresses basic 75 - 89% of the time/requires cueing 10 - 24% of the time. Needs helper to occlude trach/needs to repeat words.  Social Interaction Social Interaction assist level: Interacts appropriately 75 - 89% of the time - Needs redirection for appropriate language or to initiate interaction.  Problem Solving Problem solving assist level: Solves basic 50 - 74% of the time/requires cueing 25 - 49% of the time  Memory Memory assist level: Recognizes or recalls 50 - 74% of the time/requires cueing 25 - 49% of the time    Medical Problem List and Plan: 1. Left-sided weakness secondary to right pontine infarct  Cont CIR 2.  DVT Prophylaxis/Anticoagulation: Subcutaneous Lovenox. Monitor platelet counts of a signs of bleeding 3. Pain Management/Cervical spondylosis: Oxycodone as needed 4. Mood/Depression/Anxiety: Lexapro 10 mg twice a day 5. Neuropsych: This patient is capable of making decisions on his own behalf. 6. Skin/Wound Care: Routine skin checks 7. Fluids/Electrolytes/Nutrition: Routine I&O 8. Hypertension. Lisinopril 10 mg daily  Monitor with increased activity  Labile at present 9. Parkinson's disease. Sinemet as directed, Mirapex 0.5 mg 3 times a day,Azilect 1 mg daily. 10. BPH. Flomax 0.4 mg daily.   PVR 3 remain pending 11.  Hyperlipidemia. Lipitor 12. OA left hip:   Pt was scheduled to have injection as oupt, no complaints at present however.  Will monitor 13. Severe OSA: CPAP 14. Hypokalemia:   K+ 3.2 on 8/29, supplemented,But now down to 2.9 Will increase supplementation  Will Cont to monitor Potassium level for the a.m. 15. Leukocytosis  Resolved 16. Disorientation, no severe agitation  Will schedule trazodone to readjust sleep/wake cycle 17. Aortic valve, mild stenosis, calcification  TEE as outpt   LOS (Days) 3 A FACE TO FACE EVALUATION WAS PERFORMED  Alysia Penna E 04/14/2016 3:21 PM

## 2016-04-15 ENCOUNTER — Inpatient Hospital Stay (HOSPITAL_COMMUNITY): Payer: Medicare Other | Admitting: Speech Pathology

## 2016-04-15 ENCOUNTER — Inpatient Hospital Stay (HOSPITAL_COMMUNITY): Payer: Medicare Other | Admitting: Occupational Therapy

## 2016-04-15 ENCOUNTER — Inpatient Hospital Stay (HOSPITAL_COMMUNITY): Payer: Medicare Other | Admitting: Physical Therapy

## 2016-04-15 MED ORDER — POTASSIUM CHLORIDE CRYS ER 20 MEQ PO TBCR
20.0000 meq | EXTENDED_RELEASE_TABLET | Freq: Two times a day (BID) | ORAL | Status: DC
  Administered 2016-04-15 – 2016-04-17 (×5): 20 meq via ORAL
  Filled 2016-04-15 (×5): qty 1

## 2016-04-15 MED ORDER — TRAZODONE HCL 50 MG PO TABS: 25.0000 mg | ORAL_TABLET | Freq: Every evening | ORAL | Status: DC | PRN

## 2016-04-15 NOTE — Progress Notes (Signed)
Occupational Therapy Session Note  Patient Details  Name: James Schultz MRN: [id number] Date of Birth: 03-Mar-1951  Today's Date: 04/15/2016 OT Individual Time: [id number] OT Individual Time Calculation (min): 73 min     Short Term Goals: Week 1:  OT Short Term Goal 1 (Week 1): Pt will complete bathing at sit > stand level in room shower with min assist OT Short Term Goal 2 (Week 1): Pt will complete toilet tranfers with mod assist of one caregiver OT Short Term Goal 3 (Week 1): Pt will complete UB dressing with mod assist OT Short Term Goal 4 (Week 1): Pt will complete sit > stand for LB dressing with mod assist of one caregiver  Skilled Therapeutic Interventions/Progress Updates:    Pt completed UB bathing and dressing at the sink per his choice this session.  He completed all tasks with supervision but then needed max assist for donning TEDs and tie up shoes.  Pt's spouse will purchase elastic shoe strings which are what he uses in his other shoes as well.  Had pt ambulate to the therapy gym with min assist and with use of the RW for support.  Max instructional cueing to take larger steps.  Once in the gym focused session on big UE AROM movements with shoulder flexion and abduction in sitting and standing.  In standing had him work on stepping to each side with large steps as well as stepping forward and back to target on the floor.  Ambulated back to room with walker with pt attempting to stay on cadence that therapist gave him while focusing on taking larger steps.  Pt left in wheelchair at end of session with safety belt in place and wife present.   Therapy Documentation Precautions:  Precautions Precautions: Fall Precaution Comments: Parkinson's disease Restrictions Weight Bearing Restrictions: No  Pain: Pain Assessment Pain Assessment: No/denies pain  See Function Navigator for Current Functional Status.   Therapy/Group: Individual Therapy  Tad Fancher OTR/L 04/15/2016,  12:49 PM

## 2016-04-15 NOTE — Progress Notes (Signed)
Occupational Therapy Session Note  Patient Details  Name: James Schultz MRN: [id number] Date of Birth: 1950-12-30  Today's Date: 04/15/2016 OT Individual Time: 1500-1545 OT Individual Time Calculation (min): 45 min     Skilled Therapeutic Interventions/Progress Updates:    Rolled pt down to the ADL apartment for practice with bed mobility on regular bed and simulated walk-in shower transfers.  Pt's wife provided pictures of shower at home and positioning of grab bar.  He was able to step in posteriorly over the simulated edge with the RW in front of him to complete transfer in the shower.  Forward stepping with min assist for transferring out of the shower.  Mod assist needed for transition from sitting to supine with mod assist to transfer from supine to sit.  While in bed had pt work on big movements of rolling side to side with arms extended.  Returned to room at end of session with pt placed in bed with call button in reach.  Bed alarm in place.   Therapy Documentation Precautions:  Precautions Precautions: Fall Precaution Comments: Parkinson's disease Restrictions Weight Bearing Restrictions: No  Pain: Pain Assessment Pain Assessment: No/denies pain ADL: See Function Navigator for Current Functional Status.   Therapy/Group: Individual Therapy  [name] OTR/L 04/15/2016, 5:24 PM

## 2016-04-15 NOTE — Progress Notes (Signed)
[facility] PHYSICAL MEDICINE & REHABILITATION     PROGRESS NOTE  Subjective/Complaints:  No issues overnight Sitting up with occupational therapy, working on ADLs  ROS: Denies CP, SOB, N/V/D.  Objective: Vital Signs: Blood pressure 139/83, pulse 66, temperature 98 F (36.7 C), temperature source Oral, resp. rate 16, height 5\' 10"  (1.778 m), weight 116.2 kg (256 lb 3.2 oz), SpO2 96 %. No results found.  Recent Labs  04/14/16 0439  WBC 8.1  HGB 12.8*  HCT 39.4  PLT 213    Recent Labs  04/14/16 0439  NA 141  K 2.9*  CL 106  GLUCOSE 107*  BUN 12  CREATININE 0.79  CALCIUM 8.9   CBG (last 3)  No results for input(s): GLUCAP in the last 72 hours.  Wt Readings from Last 3 Encounters:  04/11/16 116.2 kg (256 lb 3.2 oz)  04/08/16 123.9 kg (273 lb 2.4 oz)  01/30/15 116.1 kg (256 lb)    Physical Exam:  BP 139/83 (BP Location: Right Arm)   Pulse 66   Temp 98 F (36.7 C) (Oral)   Resp 16   Ht 5\' 10"  (1.778 m)   Wt 116.2 kg (256 lb 3.2 oz)   SpO2 96%   BMI 36.76 kg/m   Gen: NAD. Vital signs reviewed.  Eyes: EOMand Conj normal.  Cardiovascular: Normal rateand regular rhythm.  Respiratory: Effort normaland breath sounds normal. No respiratory distress.  GI: Soft. Bowel sounds are normal. He exhibits no distension.  Musculoskeletal:  Tone increased with cogwheeling and rigidity Neurological: He is alert and oriented.  Fair awareness of deficits Motor:  Limited by rigidity B/l UE 4+/5 LUE: 2+/5 hip flexion, 3+/5 knee extension, 4+/5 ankle dorsi/plantar flexion RUE: 4+/5 hip flexion, knee extension 4+/5, ankle dorsi/plantar flexion 4+/5 Skin: Skin is warmand dry Psychiatric: His affect is blunt. His speech is slurred. He is slowed.   Assessment/Plan: 1. Functional deficits secondary to right pontine infarct which require 3+ hours per day of interdisciplinary therapy in a comprehensive inpatient rehab setting. Physiatrist is providing close team supervision  and 24 hour management of active medical problems listed below. Physiatrist and rehab team continue to assess barriers to discharge/monitor patient progress toward functional and medical goals.  Function:  Bathing Bathing position   Position: Shower  Bathing parts Body parts bathed by patient: Right arm, Left arm, Chest, Abdomen, Front perineal area, Right upper leg, Left upper leg, Right lower leg, Left lower leg Body parts bathed by helper: Back, Buttocks  Bathing assist Assist Level: Touching or steadying assistance(Pt > 75%)      Upper Body Dressing/Undressing Upper body dressing   What is the patient wearing?: Pull over shirt/dress     Pull over shirt/dress - Perfomed by patient: Thread/unthread right sleeve, Thread/unthread left sleeve Pull over shirt/dress - Perfomed by helper: Put head through opening, Pull shirt over trunk        Upper body assist        Lower Body Dressing/Undressing Lower body dressing   What is the patient wearing?: Pants, Shoes, [employer], Underwear Underwear - Performed by patient: Thread/unthread right underwear leg, Thread/unthread left underwear leg Underwear - Performed by helper: Pull underwear up/down Pants- Performed by patient: Thread/unthread right pants leg, Thread/unthread left pants leg, Pull pants up/down Pants- Performed by helper: Thread/unthread right pants leg, Thread/unthread left pants leg           Shoes - Performed by helper: Don/doff right shoe, Don/doff left shoe  TED Hose - Performed by helper: Don/doff right TED hose, Don/doff left TED hose  Lower body assist Assist for lower body dressing:  (patient completed 1/7, 14%, total assist)      Toileting Toileting Toileting activity did not occur: No continent bowel/bladder event Toileting steps completed by patient: Performs perineal hygiene, Adjust clothing prior to toileting Toileting steps completed by helper: Adjust clothing after toileting    Toileting  assist     Transfers Chair/bed transfer   Chair/bed transfer method: Stand pivot Chair/bed transfer assist level: Touching or steadying assistance (Pt > 75%) Chair/bed transfer assistive device: Mechanical lift Mechanical lift: Primary school teacher     Max distance: 150     Wheelchair   Type: Manual Max wheelchair distance: 150 Assist Level: Supervision or verbal cues  Cognition Comprehension Comprehension assist level: Understands basic 90% of the time/cues < 10% of the time  Expression Expression assist level: Expresses basic 90% of the time/requires cueing < 10% of the time.  Social Interaction Social Interaction assist level: Interacts appropriately 75 - 89% of the time - Needs redirection for appropriate language or to initiate interaction.  Problem Solving Problem solving assist level: Solves basic 75 - 89% of the time/requires cueing 10 - 24% of the time  Memory Memory assist level: Recognizes or recalls 50 - 74% of the time/requires cueing 25 - 49% of the time    Medical Problem List and Plan: 1. Left-sided weakness secondary to right pontine infarct  Cont CIR PT, OT, SLP 2.  DVT Prophylaxis/Anticoagulation: Subcutaneous Lovenox. Monitor platelet counts of a signs of bleeding 3. Pain Management/Cervical spondylosis: Oxycodone as needed 4. Mood/Depression/Anxiety: Lexapro 10 mg twice a day but this may interact with MAOI Azilect will D/C, may consider Wellbutrin if pts mood declines 5. Neuropsych: This patient is capable of making decisions on his own behalf. 6. Skin/Wound Care: Routine skin checks 7. Fluids/Electrolytes/Nutrition: Routine I&O 8. Hypertension. Lisinopril 10 mg daily  Monitor with increased activity   Vitals:   04/14/16 2145 04/15/16 0506  BP:  139/83  Pulse: 78 66  Resp: 18 16  Temp:  98 F (36.7 C)   9. Parkinson's disease. Sinemet as directed, Mirapex 0.5 mg 3 times a day,Azilect 1 mg daily. 10. BPH. Flomax 0.4 mg daily.   PVR 3  remain pending 11. Hyperlipidemia. Lipitor 12. OA left hip:   Pt was scheduled to have injection as oupt, no complaints at present however.  Will monitor 13. Severe OSA: CPAP 14. Hypokalemia:   KCL 30meq BID  Will Cont to monitor   16. Disorientation, no severe agitation  Will schedule trazodone to readjust sleep/wake cycle 17. Aortic valve, mild stenosis, calcification  TEE as outpt   LOS (Days) 4 A FACE TO FACE EVALUATION WAS PERFORMED  James Schultz E 04/15/2016 7:56 AM

## 2016-04-15 NOTE — Progress Notes (Signed)
Physical Therapy Note  Patient Details  Name: James Schultz MRN: [id number] Date of Birth: October 25, 1950 Today's Date: 04/15/2016    Time: [id number] minutes  1:1 No c/o pain. Pt performed gait in controlled environment with RW with close supervision, cuing to clear feet only when fatigued from walking >200'.  Gait around and over obstacles with min A for balance, cues for safety and attention on Lt side.  Ramp and curb training and uneven surface with pt only requiring assist for gait on uneven surfaces (min A).  Standing ball toss on rebounder for balance and for focus on large UE movements and improving trunk rotation and mobility.  Nu step x 10 minutes level 5 for reciprocal motions and activity tolerance. Stair negotiation x 12 stairs with close supervision and cues for placing foot all the way up on the step when ascending. Picking up objects from floor for balance training with close supervision, no LOB.  Pt improving balance and gait, continues to require cues for safety and Lt attention   Neiman Roots 04/15/2016, 9:44 AM

## 2016-04-15 NOTE — Plan of Care (Signed)
Problem: RH BOWEL ELIMINATION Goal: RH STG MANAGE BOWEL WITH ASSISTANCE STG Manage Bowel with Mod Assistance.   Outcome: Not Progressing Total

## 2016-04-15 NOTE — Progress Notes (Signed)
Speech Language Pathology Daily Session Note  Patient Details  Name: James Schultz MRN: [id number] Date of Birth: 1950-10-11  Today's Date: 04/15/2016 SLP Individual Time: 1300-1345 SLP Individual Time Calculation (min): 45 min   Short Term Goals: Week 1: SLP Short Term Goal 1 (Week 1): Pt will reorient to place, date, and situation with min assist verbal cues.   SLP Short Term Goal 2 (Week 1): Pt will sustain his attention to basic, familiar tasks for ~2-3 minutes with mod assist verbal cues.  SLP Short Term Goal 3 (Week 1): Pt will use call bell to request help as needed with mod assist multimodal cues.    Skilled Therapeutic Interventions: Skilled treatment session focused on cognitive goals. Upon arrival, patient was awake while upright in wheelchair. Patient propelled himself to and from the SLP office with extra time and Mod A verbal cues to attend to right field of environment. Patient was independently oriented to situation, place, month and date but required total A for orientation to year. Patient also recalled events from previous therapy sessions with Supervision-Min A question cues. Patient demonstrated sustained attention to topic of conversation and functional tasks for ~3 minute intervals. Patient left upright in wheelchair with all needs within reach and quick release belt in place. Continue with current plan of care.   Function:  Eating Eating   Modified Consistency Diet: No Eating Assist Level: Set up assist for   Eating Set Up Assist For: Opening containers;Cutting food       Cognition Comprehension Comprehension assist level: Understands basic 90% of the time/cues < 10% of the time  Expression   Expression assist level: Expresses basic 90% of the time/requires cueing < 10% of the time.  Social Interaction Social Interaction assist level: Interacts appropriately 75 - 89% of the time - Needs redirection for appropriate language or to initiate interaction.  Problem  Solving Problem solving assist level: Solves basic 75 - 89% of the time/requires cueing 10 - 24% of the time  Memory Memory assist level: Recognizes or recalls 50 - 74% of the time/requires cueing 25 - 49% of the time    Pain Pain Assessment Pain Assessment: No/denies pain  Therapy/Group: Individual Therapy  Harlyn Rathmann 04/15/2016, 3:10 PM

## 2016-04-16 ENCOUNTER — Inpatient Hospital Stay (HOSPITAL_COMMUNITY): Payer: Medicare Other | Admitting: Occupational Therapy

## 2016-04-16 ENCOUNTER — Inpatient Hospital Stay (HOSPITAL_COMMUNITY): Payer: Medicare Other | Admitting: Speech Pathology

## 2016-04-16 ENCOUNTER — Inpatient Hospital Stay (HOSPITAL_COMMUNITY): Payer: Medicare Other

## 2016-04-16 NOTE — Progress Notes (Signed)
Physical Therapy Note  Patient Details  Name: James Schultz MRN: [id number] Date of Birth: 01-11-1951 Today's Date: 04/16/2016  1300-1400, 60 min Pain: none noted  Wife present and assisted with toileting.  Pt reported he needed to use toilet to urinate.  Pt continent of bladder but some urine fell to floor due to pt's difficulty fitting on BSC over toilet. After returning to recliner, pt stated he needed to have BM, but had a bowel accident as he stood up.  Pt had loose stool x 2 more.  Pt used RW safely with cues for toilet transfers and standing at sink. Gait with RW on level tile x 150' with min guard assist, cues for longer steps and wider stance.  Standing neuro re-ed for bil hip alignment for wider BOS. Pt left resting in w/c with quick release belt applied and all needs within reach. Wife present.     James Schultz 04/16/2016, 12:35 PM

## 2016-04-16 NOTE — Progress Notes (Signed)
[place] PHYSICAL MEDICINE & REHABILITATION     PROGRESS NOTE  Subjective/Complaints:  Slept well. Anxious to get home. Asked if his DC date could be moved up.     ROS: Denies CP, SOB, N/V/D.  Objective: Vital Signs: Blood pressure 120/73, pulse 80, temperature 98 F (36.7 C), temperature source Oral, resp. rate 18, height 5\' 10"  (1.778 m), weight 116.2 kg (256 lb 3.2 oz), SpO2 98 %. No results found.  Recent Labs  04/14/16 0439  WBC 8.1  HGB 12.8*  HCT 39.4  PLT 213    Recent Labs  04/14/16 0439  NA 141  K 2.9*  CL 106  GLUCOSE 107*  BUN 12  CREATININE 0.79  CALCIUM 8.9   CBG (last 3)  No results for input(s): GLUCAP in the last 72 hours.  Wt Readings from Last 3 Encounters:  04/11/16 116.2 kg (256 lb 3.2 oz)  04/08/16 123.9 kg (273 lb 2.4 oz)  01/30/15 116.1 kg (256 lb)    Physical Exam:  BP 120/73 (BP Location: Right Arm)   Pulse 80   Temp 98 F (36.7 C) (Oral)   Resp 18   Ht 5\' 10"  (1.778 m)   Wt 116.2 kg (256 lb 3.2 oz)   SpO2 98%   BMI 36.76 kg/m   Gen: NAD. Vital signs reviewed.  Eyes: EOMand Conj normal.  Cardiovascular: Normal rateand regular rhythm.  Respiratory: Effort normaland breath sounds normal. No respiratory distress.  GI: Soft. Bowel sounds are normal. He exhibits no distension.  Musculoskeletal:  Tone increased with cogwheeling and rigidity Neurological: He is alert and oriented.  Motor:  Limited by rigidity B/l UE 4+/5 LUE: 2+/5 hip flexion, 3+/5 knee extension, 4+/5 ankle dorsi/plantar flexion RUE: 4+/5 hip flexion, knee extension 4+/5, ankle dorsi/plantar flexion 4+/5 Skin: Skin is warmand dry Psychiatric: His affect is blunt. His speech is slurred. He is slowed.   Assessment/Plan: 1. Functional deficits secondary to right pontine infarct which require 3+ hours per day of interdisciplinary therapy in a comprehensive inpatient rehab setting. Physiatrist is providing close team supervision and 24 hour management of  active medical problems listed below. Physiatrist and rehab team continue to assess barriers to discharge/monitor patient progress toward functional and medical goals.  Function:  Bathing Bathing position   Position: Wheelchair/chair at sink  Bathing parts Body parts bathed by patient: Right arm, Left arm, Chest, Abdomen Body parts bathed by helper: Back  Bathing assist Assist Level: Touching or steadying assistance(Pt > 75%)      Upper Body Dressing/Undressing Upper body dressing   What is the patient wearing?: Pull over shirt/dress     Pull over shirt/dress - Perfomed by patient: Thread/unthread right sleeve, Thread/unthread left sleeve, Put head through opening, Pull shirt over trunk Pull over shirt/dress - Perfomed by helper: Put head through opening, Pull shirt over trunk        Upper body assist Assist Level: Set up      Lower Body Dressing/Undressing Lower body dressing   What is the patient wearing?: Shoes, [employer]  - Performed by patient: Thread/unthread right underwear leg, Thread/unthread left underwear leg Underwear - Performed by helper: Pull underwear up/down Pants- Performed by patient: Thread/unthread right pants leg, Thread/unthread left pants leg, Pull pants up/down Pants- Performed by helper: Thread/unthread right pants leg, Thread/unthread left pants leg         Shoes - Performed by patient: Don/doff left shoe Shoes - Performed by helper: Don/doff right shoe, Fasten right, Fasten left  TED Hose - Performed by helper: Don/doff right TED hose, Don/doff left TED hose  Lower body assist Assist for lower body dressing:  (patient completed 1/7, 14%, total assist)      Toileting Toileting Toileting activity did not occur: No continent bowel/bladder event Toileting steps completed by patient: Performs perineal hygiene, Adjust clothing prior to toileting Toileting steps completed by helper: Adjust clothing after toileting    Toileting  assist     Transfers Chair/bed transfer   Chair/bed transfer method: Stand pivot Chair/bed transfer assist level: Touching or steadying assistance (Pt > 75%) Chair/bed transfer assistive device: Mechanical lift Mechanical lift: Ecologist     Max distance: 150     Wheelchair   Type: Manual Max wheelchair distance: 150 Assist Level: Supervision or verbal cues  Cognition Comprehension Comprehension assist level: Understands basic 90% of the time/cues < 10% of the time  Expression Expression assist level: Expresses basic 90% of the time/requires cueing < 10% of the time.  Social Interaction Social Interaction assist level: Interacts appropriately 75 - 89% of the time - Needs redirection for appropriate language or to initiate interaction.  Problem Solving Problem solving assist level: Solves basic 75 - 89% of the time/requires cueing 10 - 24% of the time  Memory Memory assist level: Recognizes or recalls 50 - 74% of the time/requires cueing 25 - 49% of the time    Medical Problem List and Plan: 1. Left-sided weakness secondary to right pontine infarct  Cont CIR PT, OT, SLP 2.  DVT Prophylaxis/Anticoagulation: Subcutaneous Lovenox. Monitor platelet counts of a signs of bleeding 3. Pain Management/Cervical spondylosis: Oxycodone as needed 4. Mood/Depression/Anxiety: Lexapro 10 mg twice a day but this may interact with MAOI Azilect---stopped. may consider Wellbutrin if pts mood declines 5. Neuropsych: This patient is capable of making decisions on his own behalf. 6. Skin/Wound Care: Routine skin checks 7. Fluids/Electrolytes/Nutrition: Routine I&O 8. Hypertension. Lisinopril 10 mg daily  Monitor with increased activity   Vitals:   04/15/16 2221 04/16/16 0601  BP:  120/73  Pulse: 82 80  Resp: 18 18  Temp:  98 F (36.7 C)   9. Parkinson's disease. Sinemet as directed, Mirapex 0.5 mg 3 times a day,Azilect 1 mg daily. 10. BPH. Flomax 0.4 mg daily.     11.  Hyperlipidemia. Lipitor 12. OA left hip:   Pt was scheduled to have injection as oupt, no complaints at present however.  Will monitor 13. Severe OSA: CPAP 14. Hypokalemia:   KCL 54meq BID  2.9 on 8/31---recheck tomorrow   16. Disorientation, no severe agitation   scheduled trazodone to readjust sleep/wake cycle 17. Aortic valve, mild stenosis, calcification  TEE as outpt   LOS (Days) 5 A FACE TO FACE EVALUATION WAS PERFORMED  SWARTZ,ZACHARY T 04/16/2016 9:32 AM

## 2016-04-16 NOTE — Plan of Care (Signed)
Problem: RH BLADDER ELIMINATION Goal: RH STG MANAGE BLADDER WITH ASSISTANCE STG Manage Bladder With Mod Assistance   Outcome: Not Progressing Condom cath at [facility]

## 2016-04-16 NOTE — Plan of Care (Signed)
Problem: RH BLADDER ELIMINATION Goal: RH STG MANAGE BLADDER WITH ASSISTANCE STG Manage Bladder With Mod Assistance   Outcome: Not Progressing Patient uses condom cath

## 2016-04-16 NOTE — Progress Notes (Addendum)
Occupational Therapy Session Note  Patient Details  Name: James Schultz MRN: [id number] Date of Birth: 12/03/1950  Today's Date: 04/16/2016 OT Individual Time: 1510-1543 and 0857-1000 OT Individual Time Calculation (min): 33 min and 63 minutes     Short Term Goals: Week 1:  OT Short Term Goal 1 (Week 1): Pt will complete bathing at sit > stand level in room shower with min assist OT Short Term Goal 2 (Week 1): Pt will complete toilet tranfers with mod assist of one caregiver OT Short Term Goal 3 (Week 1): Pt will complete UB dressing with mod assist OT Short Term Goal 4 (Week 1): Pt will complete sit > stand for LB dressing with mod assist of one caregiver  Skilled Therapeutic Interventions/Progress Updates:   Pt participated in skilled OT session focusing on activity tolerance, safety awareness, and standing endurance during self care completion. Pt was lying in bed upon skilled OT arrival and was agreeable to participate in ADLs shower level. Pt ambulated into bathroom with Min A and extra time with cues for big strides. Shower chair transfer completed with Min A and instruction for hand placement. Bathing completed with pt seated on bench laterally leaning and using LH sponge. Dressing completed at edge of tub bench with pt requiring extra time and cuing on appropriate use of reacher. Afterwards pt ambulated to sink to brush teeth with setup. At end of session pt returned to recliner with safety belt and all needs within reach.   2nd Session 1:1 Tx (33 minutes) Pt participated in skilled OT session focusing on caregiver training, safety awareness, and activity tolerance. Pts wife, Edmonia Lynch, was present during session. Edmonia Lynch was agreeable to participating in educational session regarding pts shower and toilet transfers. Jeannie and therapist collaboration was completed to simulate pts home bathroom. Pt completed shower chair and toilet transfer with steadying assistance with caregiver  instruction on proper hand placement for sit<stands during ADLs. Bathroom modifications discussed with Jeannie reporting plans to have extra horizontal grab bar in shower. Jeannie reported wanting bariatric BSC for home and reported plans to contact SW. At end of session pt was returned to bed and repositioned for comfort. Bed alarm set and all needs within reach and Jeannie present.    Therapy Documentation Precautions:  Precautions Precautions: Fall Precaution Comments: Parkinson's disease Restrictions Weight Bearing Restrictions: No General:   Vital Signs:   Pain: No c/o pain during session  Pain Assessment Pain Assessment: No/denies pain     See Function Navigator for Current Functional Status.   Therapy/Group: Individual Therapy  Samanthia Howland A Collan Schoenfeld 04/16/2016, 4:52 PM

## 2016-04-16 NOTE — Progress Notes (Signed)
Speech Language Pathology Daily Session Note  Patient Details  Name: James Schultz MRN: [id number] Date of Birth: Oct 05, 1950  Today's Date: 04/16/2016 SLP Individual Time: 1102-1202 SLP Individual Time Calculation (min): 60 min   Short Term Goals: Week 1: SLP Short Term Goal 1 (Week 1): Pt will reorient to place, date, and situation with min assist verbal cues.   SLP Short Term Goal 2 (Week 1): Pt will sustain his attention to basic, familiar tasks for ~2-3 minutes with mod assist verbal cues.  SLP Short Term Goal 3 (Week 1): Pt will use call bell to request help as needed with mod assist multimodal cues.    Skilled Therapeutic Interventions:   Skilled treatment session focused on addressing cognition goals. SLP facilitated session by providing set-up of a contextual task of reading the newspaper with a discussion regarding orientation and current events information.  Patient required Mod verbal and visual cues for recall of DOW, year, and specific place.  Patient apologized for being upset earlier today and reported that sitting in his room by himself upsets him; RN notified.  Patient sustained attention to conversations about newspaper headlines with for 1-2 minute increments with Min verbal cues for redirection; patient appeared to perseverate on apologizing for being upset earlier, this therapists with not involved and assured the patient that is was in the past and ok.  Patient completed mildly complex problem solving with word searches and jumbles with Min cues for awareness of errors.  Continue with current plan of care.    Function:  Cognition Comprehension Comprehension assist level: Understands basic 90% of the time/cues < 10% of the time  Expression   Expression assist level: Expresses basic 90% of the time/requires cueing < 10% of the time.  Social Interaction Social Interaction assist level: Interacts appropriately 75 - 89% of the time - Needs redirection for appropriate  language or to initiate interaction.  Problem Solving Problem solving assist level: Solves basic 50 - 74% of the time/requires cueing 25 - 49% of the time  Memory Memory assist level: Recognizes or recalls 50 - 74% of the time/requires cueing 25 - 49% of the time    Pain Pain Assessment Pain Assessment: No/denies pain  Therapy/Group: Individual Therapy  Carmelia Roller., CCC-SLP [id number]  [place] 04/16/2016, 12:58 PM

## 2016-04-17 ENCOUNTER — Inpatient Hospital Stay (HOSPITAL_COMMUNITY): Payer: Medicare Other | Admitting: Physical Therapy

## 2016-04-17 ENCOUNTER — Inpatient Hospital Stay (HOSPITAL_COMMUNITY): Payer: Medicare Other | Admitting: Occupational Therapy

## 2016-04-17 LAB — BASIC METABOLIC PANEL
ANION GAP: 7 (ref 5–15)
BUN: 10 mg/dL (ref 6–20)
CALCIUM: 8.9 mg/dL (ref 8.9–10.3)
CO2: 26 mmol/L (ref 22–32)
Chloride: 109 mmol/L (ref 101–111)
Creatinine, Ser: 0.69 mg/dL (ref 0.61–1.24)
GLUCOSE: 103 mg/dL — AB (ref 65–99)
POTASSIUM: 3.2 mmol/L — AB (ref 3.5–5.1)
SODIUM: 142 mmol/L (ref 135–145)

## 2016-04-17 MED ORDER — POTASSIUM CHLORIDE CRYS ER 20 MEQ PO TBCR
40.0000 meq | EXTENDED_RELEASE_TABLET | Freq: Two times a day (BID) | ORAL | Status: AC
  Administered 2016-04-17 – 2016-04-18 (×2): 40 meq via ORAL
  Filled 2016-04-17 (×2): qty 2

## 2016-04-17 MED ORDER — POTASSIUM CHLORIDE CRYS ER 20 MEQ PO TBCR
20.0000 meq | EXTENDED_RELEASE_TABLET | Freq: Two times a day (BID) | ORAL | Status: DC
  Administered 2016-04-18 – 2016-04-20 (×4): 20 meq via ORAL
  Filled 2016-04-17 (×4): qty 1

## 2016-04-17 NOTE — Progress Notes (Signed)
Physical Therapy Note  Patient Details  Name: James Schultz MRN: [id number] Date of Birth: 12-19-1950 Today's Date: 04/17/2016    Time: [id number] 30 minutes  1:1 No c/o pain. Session focused on bed mobility to pt's bed height (measurements brought in by wife).  Pt first attempted to sit on bed and scoot back but bed is too high and will be too soft to get in this way.  Then Pt attempted to get up using a step stool as he does at home. Pt with multiple freezing episodes when attempting to use step stool and turn to sit on bed, pt unable to turn due to freezing. PT and pt discussed option of perhaps lowering bed or sleeping in another bed, PT to follow up with pt's wife regarding options.   Ailie Gage 04/17/2016, 8:16 PM

## 2016-04-17 NOTE — Progress Notes (Signed)
Occupational Therapy Session Note  Patient Details  Name: James Schultz MRN: [id number] Date of Birth: [birth year]  Today's Date: 04/17/2016 OT Individual Time: 1230-1300 OT Individual Time Calculation (min): 30 min     Short Term Goals:Week 1:  OT Short Term Goal 1 (Week 1): Pt will complete bathing at sit > stand level in room shower with min assist OT Short Term Goal 2 (Week 1): Pt will complete toilet tranfers with mod assist of one caregiver OT Short Term Goal 3 (Week 1): Pt will complete UB dressing with mod assist OT Short Term Goal 4 (Week 1): Pt will complete sit > stand for LB dressing with mod assist of one caregiver  Skilled Therapeutic Interventions/Progress Updates:    Pt seen for OT session addressing cognitive goals. Pt sitting up in w/c upon arrival, agreeable to tx session with wife present. Pt eating lunch upon arrival. Observed pt with difficulty managing sileware due to tremmors in UEs Pt provided with built up red handle and voiced increased ease with managing utensils.  Pt not orientated to day of the week, month or year. Pt able to be re-oriented following 2-3 VCs for problem solving. Pt telling therapist about members of his family along with ages etc. Needed mod- max cuing from wife to recall children and grandchildren along with their ages. Pt able to recall personal hx of his carrier and hobbies with min cuing. Worked with pt to count back years in order to determine wedding anniversary, pt demonstrated decreased short term memory as once he was able to figure out year, he could no longer recall month and date. Pt's wife assisted with VCs for problem solving and re-orientation demonstrating ability to assist pt appropriately with cognitive deficits.  Pt with monthly calander in room and required min A to find current date. Marked out days of the month that have already passed in order to assist pt with re-orientation and problem solving to determine day of the month.   Pt left sitting in w/c at end of session, set-up with lunch tray and all needs in reach with wife present.     Therapy Documentation Precautions:  Precautions Precautions: Fall Precaution Comments: Parkinson's disease Restrictions Weight Bearing Restrictions: No Pain:   No/ denies pain  See Function Navigator for Current Functional Status.   Therapy/Group: Individual Therapy  Lewis, Traquan Duarte C 04/17/2016, 6:57 AM

## 2016-04-17 NOTE — Progress Notes (Signed)
Pt refused to wear CPAP tonight. ?

## 2016-04-17 NOTE — Progress Notes (Signed)
[place] PHYSICAL MEDICINE & REHABILITATION     PROGRESS NOTE  Subjective/Complaints:  Still focused on discharge date and whether he can go home sooner.     ROS: Denies CP, SOB, N/V/D.  Objective: Vital Signs: Blood pressure (!) 157/91, pulse 70, temperature 97.7 F (36.5 C), temperature source Oral, resp. rate 18, height 5\' 10"  (1.778 m), weight 116.2 kg (256 lb 3.2 oz), SpO2 96 %. No results found. No results for input(s): WBC, HGB, HCT, PLT in the last 72 hours.  Recent Labs  04/17/16 0433  NA 142  K 3.2*  CL 109  GLUCOSE 103*  BUN 10  CREATININE 0.69  CALCIUM 8.9   CBG (last 3)  No results for input(s): GLUCAP in the last 72 hours.  Wt Readings from Last 3 Encounters:  04/11/16 116.2 kg (256 lb 3.2 oz)  04/08/16 123.9 kg (273 lb 2.4 oz)  01/30/15 116.1 kg (256 lb)    Physical Exam:  BP (!) 157/91 (BP Location: Left Arm)   Pulse 70   Temp 97.7 F (36.5 C) (Oral)   Resp 18   Ht 5\' 10"  (1.778 m)   Wt 116.2 kg (256 lb 3.2 oz)   SpO2 96%   BMI 36.76 kg/m   Gen: NAD. Vital signs reviewed.  Eyes: EOMand Conj normal.  Cardiovascular: Normal rateand regular rhythm.  Respiratory: Effort normaland breath sounds normal. No respiratory distress.  GI: Soft. Bowel sounds are normal. He exhibits no distension.  Musculoskeletal:  Tone increased with cogwheeling and rigidity Neurological: He is alert and oriented.  Motor:  Limited by rigidity B/l UE 4+/5 LUE: 2+/5 hip flexion, 3+/5 knee extension, 4+/5 ankle dorsi/plantar flexion RUE: 4+/5 hip flexion, knee extension 4+/5, ankle dorsi/plantar flexion 4+/5 Skin: Skin is warmand dry Psychiatric: His affect is blunt. His speech is slurred. He is slowed.   Assessment/Plan: 1. Functional deficits secondary to right pontine infarct which require 3+ hours per day of interdisciplinary therapy in a comprehensive inpatient rehab setting. Physiatrist is providing close team supervision and 24 hour management of  active medical problems listed below. Physiatrist and rehab team continue to assess barriers to discharge/monitor patient progress toward functional and medical goals.  Function:  Bathing Bathing position   Position: Shower  Bathing parts Body parts bathed by patient: Right arm, Left arm, Chest, Abdomen, Front perineal area, Right upper leg, Left upper leg, Right lower leg, Left lower leg, Back Body parts bathed by helper: Back  Bathing assist Assist Level: Touching or steadying assistance(Pt > 75%)      Upper Body Dressing/Undressing Upper body dressing   What is the patient wearing?: Pull over shirt/dress     Pull over shirt/dress - Perfomed by patient: Thread/unthread right sleeve, Thread/unthread left sleeve, Put head through opening, Pull shirt over trunk Pull over shirt/dress - Perfomed by helper: Put head through opening, Pull shirt over trunk        Upper body assist Assist Level: Supervision or verbal cues      Lower Body Dressing/Undressing Lower body dressing   What is the patient wearing?: Underwear, Pants, [employer], Shoes Underwear - Performed by patient: Pull underwear up/down Underwear - Performed by helper: Thread/unthread right underwear leg, Thread/unthread left underwear leg (Due to time constraints) Pants- Performed by patient: Thread/unthread right pants leg, Thread/unthread left pants leg, Pull pants up/down Pants- Performed by helper: Thread/unthread right pants leg, Thread/unthread left pants leg         Shoes - Performed by patient: Don/doff left  shoe Shoes - Performed by helper: Don/doff right shoe, Don/doff left shoe, Fasten right, Fasten left       TED Hose - Performed by helper: Don/doff right TED hose, Don/doff left TED hose  Lower body assist Assist for lower body dressing: Touching or steadying assistance (Pt > 75%)      Toileting Toileting Toileting activity did not occur: No continent bowel/bladder event Toileting steps completed by  patient: Performs perineal hygiene, Adjust clothing prior to toileting Toileting steps completed by helper: Adjust clothing prior to toileting, Performs perineal hygiene, Adjust clothing after toileting Toileting Assistive Devices: Grab bar or rail  Toileting assist Assist level: Touching or steadying assistance (Pt.75%)   Transfers Chair/bed transfer   Chair/bed transfer method: Ambulatory Chair/bed transfer assist level: Touching or steadying assistance (Pt > 75%) Chair/bed transfer assistive device: Mechanical lift Mechanical lift: Stedy   Locomotion Ambulation     Max distance: 150 Assist level: Touching or steadying assistance (Pt > 75%)   Wheelchair   Type: Manual Max wheelchair distance: 150 Assist Level: Supervision or verbal cues  Cognition Comprehension Comprehension assist level: Understands basic 90% of the time/cues < 10% of the time  Expression Expression assist level: Expresses basic 90% of the time/requires cueing < 10% of the time.  Social Interaction Social Interaction assist level: Interacts appropriately 75 - 89% of the time - Needs redirection for appropriate language or to initiate interaction.  Problem Solving Problem solving assist level: Solves basic 75 - 89% of the time/requires cueing 10 - 24% of the time  Memory Memory assist level: Recognizes or recalls 50 - 74% of the time/requires cueing 25 - 49% of the time    Medical Problem List and Plan: 1. Left-sided weakness secondary to right pontine infarct  Cont CIR PT, OT, SLP therapies 2.  DVT Prophylaxis/Anticoagulation: Subcutaneous Lovenox. Monitor platelet counts of a signs of bleeding 3. Pain Management/Cervical spondylosis: Oxycodone as needed 4. Mood/Depression/Anxiety: Lexapro 10 mg twice a day but this may interact with MAOI Azilect---stopped. may consider Wellbutrin if pts mood declines 5. Neuropsych: This patient is capable of making decisions on his own behalf. 6. Skin/Wound Care: Routine skin  checks 7. Fluids/Electrolytes/Nutrition: Routine I&O 8. Hypertension. Lisinopril 10 mg daily  Monitor with increased activity   Vitals:   04/16/16 1239 04/17/16 0522  BP: 112/62 (!) 157/91  Pulse: 83 70  Resp: 18 18  Temp: 97.7 F (36.5 C) 97.7 F (36.5 C)   9. Parkinson's disease. Sinemet as directed, Mirapex 0.5 mg 3 times a day,Azilect 1 mg daily. 10. BPH. Flomax 0.4 mg daily.    -condom cath [facility] 11. Hyperlipidemia. Lipitor 12. OA left hip:   Pt was scheduled to have injection as oupt, no complaints at present however.  Will monitor 13. Severe OSA: CPAP 14. Hypokalemia:   KCL 52meq BID  2.9 on 8/31---3.2 today--increase to 75meq BID x2 doses  -recheck level Tuesday   16. Disorientation, no severe agitation   scheduled trazodone to readjust sleep/wake cycle 17. Aortic valve, mild stenosis, calcification  TEE as outpt   LOS (Days) 6 A FACE TO FACE EVALUATION WAS PERFORMED  SWARTZ,ZACHARY T 04/17/2016 10:50 AM

## 2016-04-17 NOTE — Progress Notes (Signed)
Patient refused CPAP for tonight, stating he cannot sleep using the hospital supplied masks.  States wife will bring in home mask for use.

## 2016-04-18 ENCOUNTER — Inpatient Hospital Stay (HOSPITAL_COMMUNITY): Payer: Medicare Other | Admitting: Occupational Therapy

## 2016-04-18 ENCOUNTER — Inpatient Hospital Stay (HOSPITAL_COMMUNITY): Payer: Medicare Other | Admitting: Speech Pathology

## 2016-04-18 ENCOUNTER — Inpatient Hospital Stay (HOSPITAL_COMMUNITY): Payer: Medicare Other

## 2016-04-18 NOTE — Progress Notes (Signed)
Occupational Therapy Session Note  Patient Details  Name: James Schultz MRN: [id number] Date of Birth: 03/08/1951  Today's Date: 04/18/2016 OT Individual Time: 1335-1435 OT Individual Time Calculation (min): 60 min     Short Term Goals: Week 1:  OT Short Term Goal 1 (Week 1): Pt will complete bathing at sit > stand level in room shower with min assist OT Short Term Goal 2 (Week 1): Pt will complete toilet tranfers with mod assist of one caregiver OT Short Term Goal 3 (Week 1): Pt will complete UB dressing with mod assist OT Short Term Goal 4 (Week 1): Pt will complete sit > stand for LB dressing with mod assist of one caregiver  Skilled Therapeutic Interventions/Progress Updates:    ADL session with wife present for education.  Pt needing min assist for sit to stand from recliner in room and with noted decreased speed of gait with mobility.  Per wife he was behind with getting his Parkinson's meds.  Completed transfer to the tub seat with the RW and mod demonstrational cueing.  Max instructional cueing for sequencing bathing and use of LH sponge.  He was able to donn undergarment protector and shorts with use of the reacher and min assist.  Max assist for TEDs this session.  Pt's wife voiced understanding of techniques and recommendations.  Discussed with SW the need for wide [facility] for home.  Pt left in recliner with call button in reach and wife present.  Did not fasten safety belt as pt's wife will be in the room with him until the next therapy.   Therapy Documentation Precautions:  Precautions Precautions: Fall Precaution Comments: Parkinson's disease Restrictions Weight Bearing Restrictions: No  Pain: Pain Assessment Pain Assessment: No/denies pain ADL: See Function Navigator for Current Functional Status.   Therapy/Group: Individual Therapy  Benjermin Korber OTR/L 04/18/2016, 4:21 PM

## 2016-04-18 NOTE — Progress Notes (Signed)
[facility] PHYSICAL MEDICINE & REHABILITATION     PROGRESS NOTE  Subjective/Complaints:  Still focused on discharge date and whether he can go home sooner.     ROS: Denies CP, SOB, N/V/D.  Objective: Vital Signs: Blood pressure (!) 171/84, pulse 68, temperature 98.3 F (36.8 C), temperature source Oral, resp. rate 18, height 5\' 10"  (1.778 m), weight 115.5 kg (254 lb 9.6 oz), SpO2 97 %. No results found. No results for input(s): WBC, HGB, HCT, PLT in the last 72 hours.  Recent Labs  04/17/16 0433  NA 142  K 3.2*  CL 109  GLUCOSE 103*  BUN 10  CREATININE 0.69  CALCIUM 8.9   CBG (last 3)  No results for input(s): GLUCAP in the last 72 hours.  Wt Readings from Last 3 Encounters:  04/18/16 115.5 kg (254 lb 9.6 oz)  04/08/16 123.9 kg (273 lb 2.4 oz)  01/30/15 116.1 kg (256 lb)    Physical Exam:  BP (!) 171/84 (BP Location: Left Arm)   Pulse 68   Temp 98.3 F (36.8 C) (Oral)   Resp 18   Ht 5\' 10"  (1.778 m)   Wt 115.5 kg (254 lb 9.6 oz)   SpO2 97%   BMI 36.53 kg/m   Gen: NAD. Vital signs reviewed.  Eyes: EOMand Conj normal.  Cardiovascular: Normal rateand regular rhythm.  Respiratory: Effort normaland breath sounds normal. No respiratory distress.  GI: Soft. Bowel sounds are normal. He exhibits no distension.  Musculoskeletal:  Tone increased with cogwheeling and rigidity Neurological: He is alert and oriented.  Motor:  Limited by rigidity B/l UE 4+/5 LUE: 2+/5 hip flexion, 3+/5 knee extension, 4+/5 ankle dorsi/plantar flexion RUE: 4+/5 hip flexion, knee extension 4+/5, ankle dorsi/plantar flexion 4+/5 Skin: Skin is warmand dry Psychiatric: His affect is blunt. His speech is slurred. He is slowed.   Assessment/Plan: 1. Functional deficits secondary to right pontine infarct which require 3+ hours per day of interdisciplinary therapy in a comprehensive inpatient rehab setting. Physiatrist is providing close team supervision and 24 hour management of  active medical problems listed below. Physiatrist and rehab team continue to assess barriers to discharge/monitor patient progress toward functional and medical goals.  Function:  Bathing Bathing position   Position: Shower  Bathing parts Body parts bathed by patient: Right arm, Left arm, Chest, Abdomen, Front perineal area, Right upper leg, Left upper leg, Right lower leg, Left lower leg, Back Body parts bathed by helper: Back  Bathing assist Assist Level: Touching or steadying assistance(Pt > 75%)      Upper Body Dressing/Undressing Upper body dressing   What is the patient wearing?: Pull over shirt/dress     Pull over shirt/dress - Perfomed by patient: Thread/unthread right sleeve, Thread/unthread left sleeve, Put head through opening, Pull shirt over trunk Pull over shirt/dress - Perfomed by helper: Put head through opening, Pull shirt over trunk        Upper body assist Assist Level: Supervision or verbal cues      Lower Body Dressing/Undressing Lower body dressing   What is the patient wearing?: Underwear, Pants, [employer], Shoes Underwear - Performed by patient: Pull underwear up/down Underwear - Performed by helper: Thread/unthread right underwear leg, Thread/unthread left underwear leg (Due to time constraints) Pants- Performed by patient: Thread/unthread right pants leg, Thread/unthread left pants leg, Pull pants up/down Pants- Performed by helper: Thread/unthread right pants leg, Thread/unthread left pants leg         Shoes - Performed by patient: Don/doff left  shoe Shoes - Performed by helper: Don/doff right shoe, Don/doff left shoe, Fasten right, Fasten left       TED Hose - Performed by helper: Don/doff right TED hose, Don/doff left TED hose  Lower body assist Assist for lower body dressing: Touching or steadying assistance (Pt > 75%)      Toileting Toileting Toileting activity did not occur: No continent bowel/bladder event Toileting steps completed by  patient: Performs perineal hygiene, Adjust clothing prior to toileting Toileting steps completed by helper: Adjust clothing prior to toileting, Performs perineal hygiene, Adjust clothing after toileting Toileting Assistive Devices: Grab bar or rail  Toileting assist Assist level: Touching or steadying assistance (Pt.75%)   Transfers Chair/bed transfer   Chair/bed transfer method: Ambulatory Chair/bed transfer assist level: Touching or steadying assistance (Pt > 75%) Chair/bed transfer assistive device: Environmental manager lift: Ecologist     Max distance: 150 Assist level: Touching or steadying assistance (Pt > 75%)   Wheelchair   Type: Manual Max wheelchair distance: 150 Assist Level: Supervision or verbal cues  Cognition Comprehension Comprehension assist level: Understands basic 90% of the time/cues < 10% of the time  Expression Expression assist level: Expresses basic 90% of the time/requires cueing < 10% of the time.  Social Interaction Social Interaction assist level: Interacts appropriately 75 - 89% of the time - Needs redirection for appropriate language or to initiate interaction.  Problem Solving Problem solving assist level: Solves basic 75 - 89% of the time/requires cueing 10 - 24% of the time  Memory Memory assist level: Recognizes or recalls 50 - 74% of the time/requires cueing 25 - 49% of the time    Medical Problem List and Plan: 1. Left-sided weakness secondary to right pontine infarct  Cont CIR PT, OT, SLP therapies 2.  DVT Prophylaxis/Anticoagulation: Subcutaneous Lovenox. Monitor platelet counts of a signs of bleeding 3. Pain Management/Cervical spondylosis: Oxycodone as needed 4. Mood/Depression/Anxiety: Lexapro 10 mg twice a day but this may interact with MAOI Azilect---stopped. may consider Wellbutrin if pts mood declines 5. Neuropsych: This patient is capable of making decisions on his own behalf. 6. Skin/Wound Care: Routine skin  checks 7. Fluids/Electrolytes/Nutrition: Routine I&O 8. Hypertension. Lisinopril 10 mg daily  Monitor with increased activity   Vitals:   04/17/16 1412 04/18/16 0643  BP: 104/65 (!) 171/84  Pulse: 80 68  Resp: 20 18  Temp: 97.9 F (36.6 C) 98.3 F (36.8 C)   9. Parkinson's disease. Sinemet as directed, Mirapex 0.5 mg 3 times a day,Azilect 1 mg daily. 10. BPH. Flomax 0.4 mg daily.    -condom cath QHS 11. Hyperlipidemia. Lipitor 12. OA left hip:   Pt was scheduled to have injection as oupt, no complaints at present however.  Will monitor 13. Severe OSA: CPAP 14. Hypokalemia:   KCL 41meq BID  2.9 on 8/31---3.2 today--increase to 73meq BID x2 doses  -recheck level Tuesday 9/5   BMP Latest Ref Rng & Units 04/17/2016 04/14/2016 04/12/2016  Glucose 65 - 99 mg/dL 103(H) 107(H) 118(H)  BUN 6 - 20 mg/dL 10 12 15   Creatinine 0.61 - 1.24 mg/dL 0.69 0.79 0.73  Sodium 135 - 145 mmol/L 142 141 140  Potassium 3.5 - 5.1 mmol/L 3.2(L) 2.9(L) 3.2(L)  Chloride 101 - 111 mmol/L 109 106 105  CO2 22 - 32 mmol/L 26 27 25   Calcium 8.9 - 10.3 mg/dL 8.9 8.9 8.8(L)    16. Disorientation, no severe agitation, This may be Parkinson's related rather than stroke related  scheduled trazodone to readjust sleep/wake cycle 17. Aortic valve, mild stenosis, calcification  TEE as outpt   LOS (Days) 7 A FACE TO FACE EVALUATION WAS PERFORMED  Alysia Penna E 04/18/2016 9:40 AM

## 2016-04-18 NOTE — Progress Notes (Signed)
Placed patient on CPAP for the night. Patient is tolerating well and RT will continue to monitor as needed.

## 2016-04-18 NOTE — Progress Notes (Signed)
Physical Therapy Session Note  Patient Details  Name: James Schultz MRN: [id number] Date of Birth: 10-24-1950  Today's Date: 04/18/2016 PT Individual [id number] PT Individual Time Calculation (min): 45, 55 min  Short Term Goals: Week 1:  PT Short Term Goal 1 (Week 1): pt will perform funcitonal transfers with mod A PT Short Term Goal 2 (Week 1): pt will gait with min A 50' in controlled environment PT Short Term Goal 3 (Week 1): pt demo dynamic standing balance with mod A for functional task  Skilled Therapeutic Interventions/Progress Updates:  PT donned pt's TEDs and shoes.  Pt stood up from low recliner with min assist, extra time to RW for PT to pull up shorts. Gait on level tile and carpet with RW, close supervision, mod cues for longer R step length to avoid shuffling.  pt has [place] rugs at home; he did not catch his foot or RW on transitions from/to carpet/floor in day room.  Neuro re-ed seated in w/c using Kinetron at 50 cm/sec x 5 min for alternaing reciprocal movement and scooting forward in w/c with reciprocal pelvic movements, max assist. With RW, up/down curb/step as per home entry, with min guard/supervision, mod cues fading to 0 cues by 2nd trial. Pt left resting in w/c with quick release belt donned and all needs within reach.  tx 2:  Wife confirmed that pt's usual bed at home cannot be lowered; they have another bed which is lower; she will measure.  Therapeutic activities for wt shifting forward for sit> stand without use of UEs and pelvic dissociation via lateral leans. Family ed for gait on level tile.     Therapy Documentation Precautions:  Precautions Precautions: Fall Precaution Comments: Parkinson's disease Restrictions Weight Bearing Restrictions: No   Pain: Pain Assessment Pain Assessment: No/denies pain AM; bil knee, L hip pain , unrated PM; PT notified RN   Other Treatments: Treatments Neuromuscular Facilitation: Right;Forced use;Lower  Extremity;Activity to increase coordination;Activity to increase timing and sequencing;Activity to increase motor control;Activity to increase grading;Activity to increase lateral weight shifting;Activity to increase anterior-posterior weight shifting   See Function Navigator for Current Functional Status.   Therapy/Group: Individual Therapy  Lorette Peterkin 04/18/2016, 4:44 PM

## 2016-04-18 NOTE — Plan of Care (Signed)
Problem: RH Tub/Shower Transfers Goal: LTG Patient will perform tub/shower transfers w/assist (OT) LTG: Patient will perform tub/shower transfers with assist, with/without cues using equipment (OT)  Goals upgraded based on pt's progress.

## 2016-04-18 NOTE — Progress Notes (Signed)
Speech Language Pathology Daily Session Note  Patient Details  Name: James Schultz MRN: [id number] Date of Birth: 05/06/1951  Today's Date: 04/18/2016 SLP Individual Time: 0930-1030 SLP Individual Time Calculation (min): 60 min   Short Term Goals: Week 1: SLP Short Term Goal 1 (Week 1): Pt will reorient to place, date, and situation with min assist verbal cues.   SLP Short Term Goal 2 (Week 1): Pt will sustain his attention to basic, familiar tasks for ~2-3 minutes with mod assist verbal cues.  SLP Short Term Goal 3 (Week 1): Pt will use call bell to request help as needed with mod assist multimodal cues.    Skilled Therapeutic Interventions:   Skilled treatment session focused on addressing cognition goals. Patient reported feeling down down to confusion about discharge date.  SLP facilitated session by providing Min assist verbal and visual cues for re-orientation to today's date, discharge date and plan. Patient reported that it keeps changing with no awareness of his memory deficits.  SLP attempted to facilitate sustained attention during familiar card tasks; however, patient declined.  Therefore menu task to order lunch was used with Mod question cues to attend to task for 1 minute increments due to perseveration on earlier issue with discharge date.  Wife present at end of session and took patient off unit as recommended for a change of scene. Continue with current plan of care.    Function:   Cognition Comprehension Comprehension assist level: Understands basic 90% of the time/cues < 10% of the time  Expression   Expression assist level: Expresses basic 90% of the time/requires cueing < 10% of the time.  Social Interaction Social Interaction assist level: Interacts appropriately 50 - 74% of the time - May be physically or verbally inappropriate.  Problem Solving Problem solving assist level: Solves basic 50 - 74% of the time/requires cueing 25 - 49% of the time  Memory Memory  assist level: Recognizes or recalls 50 - 74% of the time/requires cueing 25 - 49% of the time    Pain Pain Assessment Pain Assessment: No/denies pain  Therapy/Group: Individual Therapy  Carmelia Roller., [place] [id number]  [place] 04/18/2016, 1:01 PM

## 2016-04-18 NOTE — Progress Notes (Signed)
Social Work Patient ID: James Schultz, male   DOB: [birth year], 65 y.o.   MRN: [id number]  Wife here to attend therapies with pt and learn his care. Discussing equipment needs, she prefers [facility] for follow up since they Have had them before and they like the therapists. Will await team's recomendations for equipment and work toward discharge 9/7

## 2016-04-19 ENCOUNTER — Inpatient Hospital Stay (HOSPITAL_COMMUNITY): Payer: Medicare Other | Admitting: Physical Therapy

## 2016-04-19 ENCOUNTER — Inpatient Hospital Stay (HOSPITAL_COMMUNITY): Payer: Medicare Other | Admitting: Occupational Therapy

## 2016-04-19 ENCOUNTER — Inpatient Hospital Stay (HOSPITAL_COMMUNITY): Payer: Medicare Other | Admitting: Speech Pathology

## 2016-04-19 LAB — BASIC METABOLIC PANEL
Anion gap: 7 (ref 5–15)
BUN: 11 mg/dL (ref 6–20)
CO2: 26 mmol/L (ref 22–32)
CREATININE: 0.75 mg/dL (ref 0.61–1.24)
Calcium: 9.1 mg/dL (ref 8.9–10.3)
Chloride: 109 mmol/L (ref 101–111)
GFR calc Af Amer: >60 mL/min (ref >60)
GLUCOSE: 102 mg/dL — AB (ref 65–99)
POTASSIUM: 3.8 mmol/L (ref 3.5–5.1)
Sodium: 142 mmol/L (ref 135–145)

## 2016-04-19 NOTE — Progress Notes (Signed)
Social Work Patient ID: James Schultz, male   DOB: 1951/01/29, 65 y.o.   MRN: [id number]  Wife request discharge tomorrow after therapies instead of Thursday due to pt gets confused at night time. Have checked with MD and team is aware all in agreement with this plan. Work on discharge tomorrow after therapies.

## 2016-04-19 NOTE — Progress Notes (Signed)
Speech Language Pathology Daily Session Note  Patient Details  Name: James Schultz MRN: [id number] Date of Birth: 08/26/1950  Today's Date: 04/19/2016 SLP Individual Time: [id number] SLP Individual Time Calculation (min): 42 min   Short Term Goals: Week 1: SLP Short Term Goal 1 (Week 1): Pt will reorient to place, date, and situation with min assist verbal cues.   SLP Short Term Goal 2 (Week 1): Pt will sustain his attention to basic, familiar tasks for ~2-3 minutes with mod assist verbal cues.  SLP Short Term Goal 3 (Week 1): Pt will use call bell to request help as needed with mod assist multimodal cues.    Skilled Therapeutic Interventions: Pt was seen for skilled ST targeting cognitive goals.  Pt ambulated to therapy room with supervision question cues for safe walker use.  Therapist facilitated the session with money management tasks targeting functional problem solving.  Pt required mod-max assist verbal cues for working memory when counting money and making change, which therapist was able to fade to min assist cues with written aids to compensate for memory impairment.  Pt utilized monthly calendar with min assist verbal cues to calculate number of days left until discharge.  Pt was returned to room and left in recliner with RN at bedside.  Continue per current plan of care.    Function:  Eating Eating               Cognition Comprehension Comprehension assist level: Follows basic conversation/direction with extra time/assistive device  Expression   Expression assist level: Expresses basic 90% of the time/requires cueing < 10% of the time.  Social Interaction Social Interaction assist level: Interacts appropriately 75 - 89% of the time - Needs redirection for appropriate language or to initiate interaction.  Problem Solving Problem solving assist level: Solves basic 50 - 74% of the time/requires cueing 25 - 49% of the time  Memory Memory assist level: Recognizes or recalls  50 - 74% of the time/requires cueing 25 - 49% of the time    Pain Pain Assessment Pain Assessment: No/denies pain Pain Score: 0-No pain  Therapy/Group: Individual Therapy  Kadeja Granada, Selinda Orion 04/19/2016, 11:16 AM

## 2016-04-19 NOTE — Progress Notes (Signed)
Speech Language Pathology Make Up Session Note  Patient Details  Name: James Schultz MRN: [id number] Date of Birth: Nov 14, 1950  Today's Date: 04/19/2016 SLP Individual Time: [id number] (make up session )-1355 SLP Individual Time Calculation (min): 35 min   Short Term Goals: Week 1: SLP Short Term Goal 1 (Week 1): Pt will reorient to place, date, and situation with min assist verbal cues.   SLP Short Term Goal 2 (Week 1): Pt will sustain his attention to basic, familiar tasks for ~2-3 minutes with mod assist verbal cues.  SLP Short Term Goal 3 (Week 1): Pt will use call bell to request help as needed with mod assist multimodal cues.    Skilled Therapeutic Interventions:  Pt was seen for skilled make up therapy session targeting cognitive goals.  SLP facilitated the session with a novel card game targeting functional problem solving.  Pt was able to plan and execute a problem solving strategy with overall min assist verbal cues for working memory of task protocols and rules.  Pt was able to sustain his attention to task for 5 minutes with min verbal and visual cues for redirection.  Pt recalled route when ambulating back to his room from ST treatment room with supervision verbal cues.  Pt was left in recliner with wife and visitors at bedside.  Continue per current plan of care.    Function:  Eating Eating                 Cognition Comprehension Comprehension assist level: Follows basic conversation/direction with extra time/assistive device  Expression   Expression assist level: Expresses basic 90% of the time/requires cueing < 10% of the time.  Social Interaction Social Interaction assist level: Interacts appropriately 75 - 89% of the time - Needs redirection for appropriate language or to initiate interaction.  Problem Solving Problem solving assist level: Solves basic 50 - 74% of the time/requires cueing 25 - 49% of the time  Memory Memory assist level: Recognizes or recalls 50 - 74%  of the time/requires cueing 25 - 49% of the time    Pain Pain Assessment Pain Assessment: No/denies pain  Therapy/Group: Individual Therapy  Shante Archambeault, Elmyra Ricks L 04/19/2016, 2:00 PM

## 2016-04-19 NOTE — Discharge Summary (Signed)
Discharge summary job # [id number]

## 2016-04-19 NOTE — Plan of Care (Signed)
Problem: RH BLADDER ELIMINATION Goal: RH STG MANAGE BLADDER WITH ASSISTANCE STG Manage Bladder With Mod Assistance   Outcome: Not Progressing Total assist at night due to condom cath

## 2016-04-19 NOTE — Progress Notes (Signed)
Occupational Therapy Session Note  Patient Details  Name: James Schultz MRN: [id number] Date of Birth: 1951-08-02  Today's Date: 04/19/2016 OT Individual Time: 0800-0900 OT Individual Time Calculation (min): 60 min     Short Term Goals: Week 1:  OT Short Term Goal 1 (Week 1): Pt will complete bathing at sit > stand level in room shower with min assist OT Short Term Goal 2 (Week 1): Pt will complete toilet tranfers with mod assist of one caregiver OT Short Term Goal 3 (Week 1): Pt will complete UB dressing with mod assist OT Short Term Goal 4 (Week 1): Pt will complete sit > stand for LB dressing with mod assist of one caregiver  Skilled Therapeutic Interventions/Progress Updates:    Pt in bed to start session.  Reported being sad but unable to explain why.  Noted pt tearful 3 times during session as well.  Moving much slower this am with completion of all tasks including bed mobility, sit to stand, bathing, and dressing.  Mod assist for transfer from supine to sit EOB with HOB elevated.  Mod assist for stand pivot transfer as well.  Pt completed UB bathing and dressing at the sink per his choice, and just taking a shower later in the afternoon yesterday.  Mod instructional cueing for hand placement and foot placement with sit to stand at the sink.  He needed min assist for threading the RLE through his shorts this session, even with use of the reacher.  Worked on self feeding at end of session with modified independence.  Pt left in wheelchair with safety belt in place and call button in reach.   Therapy Documentation Precautions:  Precautions Precautions: Fall Precaution Comments: Parkinson's disease Restrictions Weight Bearing Restrictions: No  Pain: Pain Assessment Pain Assessment: No/denies pain Pain Score: 0-No pain ADL: See Function Navigator for Current Functional Status.   Therapy/Group: Individual Therapy  Marien Manship OTR/L 04/19/2016, 9:31 AM

## 2016-04-19 NOTE — Progress Notes (Signed)
[place] PHYSICAL MEDICINE & REHABILITATION     PROGRESS NOTE  Subjective/Complaints:  Somewhat sad today. According to OT, but patient cannot verbalize if anything is bothering him. Patient states he slept fairly well.     ROS: Denies CP, SOB, N/V/D.  Objective: Vital Signs: Blood pressure 134/80, pulse 84, temperature 98.3 F (36.8 C), temperature source Oral, resp. rate 18, height 5\' 10"  (1.778 m), weight 115.5 kg (254 lb 9.6 oz), SpO2 99 %. No results found. No results for input(s): WBC, HGB, HCT, PLT in the last 72 hours.  Recent Labs  04/17/16 0433 04/19/16 0547  NA 142 142  K 3.2* 3.8  CL 109 109  GLUCOSE 103* 102*  BUN 10 11  CREATININE 0.69 0.75  CALCIUM 8.9 9.1   CBG (last 3)  No results for input(s): GLUCAP in the last 72 hours.  Wt Readings from Last 3 Encounters:  04/18/16 115.5 kg (254 lb 9.6 oz)  04/08/16 123.9 kg (273 lb 2.4 oz)  01/30/15 116.1 kg (256 lb)    Physical Exam:  BP 134/80 (BP Location: Left Arm)   Pulse 84   Temp 98.3 F (36.8 C) (Oral)   Resp 18   Ht 5\' 10"  (1.778 m)   Wt 115.5 kg (254 lb 9.6 oz)   SpO2 99%   BMI 36.53 kg/m   Gen: NAD. Vital signs reviewed.  Eyes: EOMand Conj normal.  Cardiovascular: Normal rateand regular rhythm.  Respiratory: Effort normaland breath sounds normal. No respiratory distress.  GI: Soft. Bowel sounds are normal. He exhibits no distension.  Musculoskeletal:  Tone increased with cogwheeling and rigidity Neurological: He is alert and oriented.  Motor:  Limited by rigidity B/l UE 4+/5 LUE: 2+/5 hip flexion, 3+/5 knee extension, 4+/5 ankle dorsi/plantar flexion RUE: 4+/5 hip flexion, knee extension 4+/5, ankle dorsi/plantar flexion 4+/5 Skin: Skin is warmand dry Psychiatric: His affect is blunt. His speech is slurred. He is slowed.   Assessment/Plan: 1. Functional deficits secondary to right pontine infarct which require 3+ hours per day of interdisciplinary therapy in a comprehensive  inpatient rehab setting. Physiatrist is providing close team supervision and 24 hour management of active medical problems listed below. Physiatrist and rehab team continue to assess barriers to discharge/monitor patient progress toward functional and medical goals.  Function:  Bathing Bathing position   Position: Shower  Bathing parts Body parts bathed by patient: Right arm, Left arm, Chest, Abdomen, Front perineal area, Right upper leg, Left upper leg, Right lower leg, Left lower leg, Back Body parts bathed by helper: Buttocks  Bathing assist Assist Level: Touching or steadying assistance(Pt > 75%)      Upper Body Dressing/Undressing Upper body dressing   What is the patient wearing?: Pull over shirt/dress     Pull over shirt/dress - Perfomed by patient: Thread/unthread right sleeve, Thread/unthread left sleeve, Put head through opening Pull over shirt/dress - Perfomed by helper: Pull shirt over trunk        Upper body assist Assist Level: Supervision or verbal cues      Lower Body Dressing/Undressing Lower body dressing   What is the patient wearing?: [employer], Shoes, Underwear, Chartered certified accountant - Performed by patient: Thread/unthread left underwear leg, Thread/unthread right underwear leg Underwear - Performed by helper: Pull underwear up/down Pants- Performed by patient: Pull pants up/down, Thread/unthread left pants leg, Thread/unthread right pants leg Pants- Performed by helper: Thread/unthread right pants leg, Thread/unthread left pants leg         Shoes - Performed  by patient: Don/doff left shoe Shoes - Performed by helper: Don/doff right shoe, Don/doff left shoe, Fasten right, Fasten left       TED Hose - Performed by helper: Don/doff right TED hose, Don/doff left TED hose  Lower body assist Assist for lower body dressing: Touching or steadying assistance (Pt > 75%)      Toileting Toileting Toileting activity did not occur: No continent bowel/bladder  event Toileting steps completed by patient: Performs perineal hygiene, Adjust clothing prior to toileting Toileting steps completed by helper: Adjust clothing prior to toileting, Performs perineal hygiene, Adjust clothing after toileting Toileting Assistive Devices: Grab bar or rail  Toileting assist Assist level: Touching or steadying assistance (Pt.75%)   Transfers Chair/bed transfer   Chair/bed transfer method: Ambulatory Chair/bed transfer assist level: Touching or steadying assistance (Pt > 75%) Chair/bed transfer assistive device: Environmental manager lift: Ecologist     Max distance: 150 Assist level: Touching or steadying assistance (Pt > 75%)   Wheelchair   Type: Manual Max wheelchair distance: 150 Assist Level: Supervision or verbal cues  Cognition Comprehension Comprehension assist level: Understands basic 90% of the time/cues < 10% of the time  Expression Expression assist level: Expresses basic 90% of the time/requires cueing < 10% of the time.  Social Interaction Social Interaction assist level: Interacts appropriately 75 - 89% of the time - Needs redirection for appropriate language or to initiate interaction.  Problem Solving Problem solving assist level: Solves basic 50 - 74% of the time/requires cueing 25 - 49% of the time  Memory Memory assist level: Recognizes or recalls 50 - 74% of the time/requires cueing 25 - 49% of the time    Medical Problem List and Plan: 1. Left-sided weakness secondary to right pontine infarct  Cont CIR PT, OT, SLP therapies Team conference in a.m. 2.  DVT Prophylaxis/Anticoagulation: Subcutaneous Lovenox. Monitor platelet counts of a signs of bleeding 3. Pain Management/Cervical spondylosis: Oxycodone as needed 4. Mood/Depression/Anxiety: Lexapro 10 mg twice a day but this may interact with MAOI Azilect---stopped. may consider Wellbutrin if pts mood declines 5. Neuropsych: This patient is capable of making decisions  on his own behalf. 6. Skin/Wound Care: Routine skin checks 7. Fluids/Electrolytes/Nutrition: Routine I&O 8. Hypertension. Lisinopril 10 mg daily  Monitor with increased activity   Vitals:   04/18/16 1312 04/19/16 0551  BP: 117/86 134/80  Pulse: 89 84  Resp: 18 18  Temp: 98.5 F (36.9 C) 98.3 F (36.8 C)   9. Parkinson's disease. Sinemet as directed, Mirapex 0.5 mg 3 times a day,Azilect 1 mg daily. 10. BPH. Flomax 0.4 mg daily.    -condom cath QHS 11. Hyperlipidemia. Lipitor 12. OA left hip:   Pt was scheduled to have injection as oupt, no complaints at present however.  Will monitor 13. Severe OSA: CPAP 14. Hypokalemia:   KCL 69meq BID  2.9 on 8/31---3.2 today--increase to 47meq BID x2 doses  -recheck level 3.8 (nl) 9/5,Continue current dose   BMP Latest Ref Rng & Units 04/19/2016 04/17/2016 04/14/2016  Glucose 65 - 99 mg/dL 102(H) 103(H) 107(H)  BUN 6 - 20 mg/dL 11 10 12   Creatinine 0.61 - 1.24 mg/dL 0.75 0.69 0.79  Sodium 135 - 145 mmol/L 142 142 141  Potassium 3.5 - 5.1 mmol/L 3.8 3.2(L) 2.9(L)  Chloride 101 - 111 mmol/L 109 109 106  CO2 22 - 32 mmol/L 26 26 27   Calcium 8.9 - 10.3 mg/dL 9.1 8.9 8.9    16. Disorientation, no severe agitation,  This may be Parkinson's related rather than stroke related   scheduled trazodone to readjust sleep/wake cycle 17. Aortic valve, mild stenosis, calcification  TEE as outpt   LOS (Days) 8 A FACE TO FACE EVALUATION WAS PERFORMED  Charlett Blake 04/19/2016 8:38 AM

## 2016-04-19 NOTE — Progress Notes (Signed)
Physical Therapy Note  Patient Details  Name: James Schultz MRN: [id number] Date of Birth: April 15, 1951 Today's Date: 04/19/2016    Time: 1030-1115 45 minutes  1:1 no c/o pain. Session focused on pt/ family education with pt and wife. Pt/wife performed gait in home and controlled environments with wife providing close supervision, good cuing for step length and posture.  Practiced up/down 1 step to simulate sunken den at home. Pt/wife performed multiple attempts with min A/close supervision and wife providing cues for sequencing and safety.  Car transfer practice to simulated sedan with pt able to perform with supervision.  Pt required increased time and frequent rest breaks during session due to fatigue from previous sessions with OT and Speech. Pt returned to room by w/c at end of session due to fatigue. Wife states that pt has an electric w/c to use at home and in community when fatigued.   [name] 04/19/2016, 11:16 AM

## 2016-04-19 NOTE — Progress Notes (Signed)
Patient refused CPAP use for the night. RT will continue to monitor as needed,

## 2016-04-19 NOTE — Progress Notes (Signed)
Social Work  Discharge Note  The overall goal for the admission was met for:   Discharge location: Yes-HOME WITH WIFE WHO CAN PROVIDE 24 HR CARE  Length of Stay: Yes-9 DAYS  Discharge activity level: Yes-SUPERVISION/MIN ASSIST LEVEL  Home/community participation: Yes  Services provided included: MD, RD, PT, OT, SLP, RN, CM, Pharmacy and SW  Financial Services: Medicare and Private Insurance: [place]  Follow-up services arranged: Home Health: [facility]-PT,OT,RN, DME: [place] and Patient/Family request agency HH: HAD THEM BEFORE, DME: [facility]  Comments (or additional information):WIFE HAS BEEN HERE DAILY TO PARTICIPATE [place]. WAS PROVIDING TO CARE TO PT PRIOR TO ADMISSION. GAVE SUPPORT GROUP INFORMATION IF DECIDE TO PURSUE  Patient/Family verbalized understanding of follow-up arrangements: Yes  Individual responsible for coordination of the follow-up plan: [place]-WIFE  Confirmed correct DME delivered: Elease Hashimoto 04/19/2016    Elease Hashimoto

## 2016-04-19 NOTE — Discharge Instructions (Signed)
Inpatient Rehab Discharge Instructions  James Schultz Discharge date and time: No discharge date for patient encounter.   Activities/Precautions/ Functional Status: Activity: activity as tolerated Diet: regular diet Wound Care: none needed Functional status:  ___ No restrictions     ___ Walk up steps independently ___ 24/7 supervision/assistance   ___ Walk up steps with assistance ___ Intermittent supervision/assistance  ___ Bathe/dress independently ___ Walk with walker     _x__ Bathe/dress with assistance ___ Walk Independently    ___ Shower independently ___ Walk with assistance    ___ Shower with assistance ___ No alcohol     ___ Return to work/school ________  Special Instructions:     COMMUNITY REFERRALS UPON DISCHARGE:    Home Health:   PT, OT, RN    [place]   Date of last service:04/20/2016  Medical Equipment/Items Ordered:WIDE BEDSIDE COMMODE  Agency/Supplier:[facility]  [phone]   GENERAL COMMUNITY RESOURCES FOR PATIENT/FAMILY: Support Groups: [facility] SUPPORT GROUP EVERY SECOND Thursday @ 3:00-4:00 PM ON THE REHAB UNIT  QUESTIONS OR CONCERNS CONTACT KATIE [phone] [facility] [place] Tuesday @ 4:00-5:00-PM AT [place] [id number] [place] [address] QUESTIONS: CONTACT AMY MARRIOTT OR ANGELA FREEMAN  [phone]  STROKE/TIA DISCHARGE INSTRUCTIONS SMOKING Cigarette smoking nearly doubles your risk of having a stroke & is the single most alterable risk factor  If you smoke or have smoked in the last 12 months, you are advised to quit smoking for your health.  Most of the excess cardiovascular risk related to smoking disappears within a year of stopping.  Ask you doctor about anti-smoking medications  [place] Quit Line: 1-800-QUIT NOW  Free Smoking Cessation Classes [phone]  CHOLESTEROL Know your levels; limit fat & cholesterol in your diet    Lipid Panel     Component Value Date/Time   CHOL 167 04/09/2016 0603   TRIG 182 (H) 04/09/2016 0603   HDL 29 (L) 04/09/2016 0603   CHOLHDL 5.8 04/09/2016 0603   VLDL 36 04/09/2016 0603   LDLCALC 102 (H) 04/09/2016 0603      Many patients benefit from treatment even if their cholesterol is at goal.  Goal: Total Cholesterol (CHOL) less than 160  Goal:  Triglycerides (TRIG) less than 150  Goal:  HDL greater than 40  Goal:  LDL (LDLCALC) less than 100   BLOOD PRESSURE American Stroke Association blood pressure target is less that 120/80 mm/Hg  Your discharge blood pressure is:  BP: 134/80  Monitor your blood pressure  Limit your salt and alcohol intake  Many individuals will require more than one medication for high blood pressure  DIABETES (A1c is a blood sugar average for last 3 months) Goal HGBA1c is under 7% (HBGA1c is blood sugar average for last 3 months)  Diabetes: No known diagnosis of diabetes    Lab Results  Component Value Date   HGBA1C 5.5 04/09/2016     Your HGBA1c can be lowered with medications, healthy diet, and exercise.  Check your blood sugar as directed by your physician  Call your physician if you experience unexplained or low blood sugars.  PHYSICAL ACTIVITY/REHABILITATION Goal is 30 minutes at least 4 days per week  Activity: Increase activity slowly, Therapies: Physical Therapy: Home Health Return to work:   Activity decreases your risk of heart attack and stroke and makes your heart stronger.  It helps control your weight and blood pressure; helps you relax and can improve your mood.  Participate in a regular exercise program.  Talk with your doctor about the best form of exercise for you (dancing, walking, swimming, cycling).  DIET/WEIGHT Goal is to maintain a healthy weight  Your discharge diet is: Diet Heart Room service appropriate? Yes; Fluid consistency: Thin  liquids Your height is:  Height: 5\' 10"  (177.8 cm) Your current weight is:  Weight: 115.5 kg (254 lb 9.6 oz) Your Body Mass Index (BMI) is:  BMI (Calculated): 36.8  Following the type of diet specifically designed for you will help prevent another stroke.  Your goal weight range is:    Your goal Body Mass Index (BMI) is 19-24.  Healthy food habits can help reduce 3 risk factors for stroke:  High cholesterol, hypertension, and excess weight.  RESOURCES Stroke/Support Group:  Call [phone]   STROKE EDUCATION PROVIDED/REVIEWED AND GIVEN TO PATIENT Stroke warning signs and symptoms How to activate emergency medical system (call 911). Medications prescribed at discharge. Need for follow-up after discharge. Personal risk factors for stroke. Pneumonia vaccine given:  Flu vaccine given:  My questions have been answered, the writing is legible, and I understand these instructions.  I will adhere to these goals & educational materials that have been provided to me after my discharge from the hospital.   [facility] in regards to Parkinson's disease  My questions have been answered and I understand these instructions. I will adhere to these goals and the provided educational materials after my discharge from the hospital.  Patient/Caregiver Signature _______________________________ Date __________  Clinician Signature _______________________________________ Date __________  Please bring this form and your medication list with you to all your follow-up doctor's appointments.

## 2016-04-20 ENCOUNTER — Inpatient Hospital Stay (HOSPITAL_COMMUNITY): Payer: Medicare Other | Admitting: Occupational Therapy

## 2016-04-20 ENCOUNTER — Inpatient Hospital Stay (HOSPITAL_COMMUNITY): Payer: Medicare Other | Admitting: Speech Pathology

## 2016-04-20 ENCOUNTER — Inpatient Hospital Stay (HOSPITAL_COMMUNITY): Payer: Medicare Other

## 2016-04-20 DIAGNOSIS — F0280 Dementia in other diseases classified elsewhere without behavioral disturbance: Secondary | ICD-10-CM

## 2016-04-20 MED ORDER — TRAZODONE HCL 50 MG PO TABS: 50.0000 mg | ORAL_TABLET | Freq: Every day | ORAL | 0 refills | Status: AC

## 2016-04-20 MED ORDER — OXYCODONE-ACETAMINOPHEN 5-325 MG PO TABS: 1.0000 | ORAL_TABLET | Freq: Four times a day (QID) | ORAL | 0 refills | Status: DC | PRN

## 2016-04-20 MED ORDER — ATORVASTATIN CALCIUM 20 MG PO TABS: 20.0000 mg | ORAL_TABLET | Freq: Every day | ORAL | 0 refills | Status: AC

## 2016-04-20 MED ORDER — RASAGILINE MESYLATE 1 MG PO TABS: 1.0000 mg | ORAL_TABLET | Freq: Every morning | ORAL | 0 refills | Status: DC

## 2016-04-20 MED ORDER — CLOPIDOGREL BISULFATE 75 MG PO TABS: 75.0000 mg | ORAL_TABLET | Freq: Every day | ORAL | 0 refills | Status: AC

## 2016-04-20 MED ORDER — TAMSULOSIN HCL 0.4 MG PO CAPS: 0.4000 mg | ORAL_CAPSULE | Freq: Every day | ORAL | 0 refills | Status: AC

## 2016-04-20 MED ORDER — LISINOPRIL 10 MG PO TABS: 10.0000 mg | ORAL_TABLET | Freq: Every day | ORAL | 0 refills | Status: AC

## 2016-04-20 MED ORDER — PRAMIPEXOLE DIHYDROCHLORIDE 1 MG PO TABS: 0.5000 mg | ORAL_TABLET | Freq: Three times a day (TID) | ORAL | 0 refills | Status: DC

## 2016-04-20 NOTE — Progress Notes (Signed)
Occupational Therapy Discharge Summary  Patient Details  Name: James Schultz MRN: [id number] Date of Birth: 27-May-1951  Today's Date: 04/20/2016 OT Individual Time: 1324-4010 OT Individual Time Calculation (min): 47 min   Session Note:  Pt showered and dressed during session.  Supervision for all transfers using the RW, but needs cueing to push up from the arms of the chair or from the surface he is sitting on.  Utilized reacher for threading pants and underpants over feet.  Mod instructional cueing to not stand up before therapist is positioned beside of him.  Max assist for tying shoes with min assist for pulling brief over hips.  All other aspects of dressing were completed at supervision level.  Pt left in wheelchair with safety belt in place at end of session.   Patient has met 9 of 9 long term goals due to improved balance and ability to compensate for deficits.  Patient to discharge at overall Supervision level.  Patient's care partner is independent to provide the necessary physical and cognitive assistance at discharge.    Reasons goals not met: NA  Recommendation:  Patient will benefit from ongoing skilled OT services in home health setting to continue to advance functional skills in the area of BADL and Reduce care partner burden.  Pt's progress has fluctuated and tends to be more dependent on medical management for his parkinsons.  When medications are given in a timely manner he can complete ADLs with supervision to min assist and functional transfers at a supervision level as well.  Feel he will benefit from initial [place] at discharge to further progression with transition to outpatient Parkinson's specific therapy to help decreased the progression of his disease and help keep his AROM and functional independence.  Equipment: wide BSC  Reasons for discharge: treatment goals met and discharge from hospital  Patient/family agrees with progress made and goals achieved: Yes  OT  Discharge Precautions/Restrictions Precautions Precautions: Fall Precaution Comments: Parkinson's disease Restrictions Weight Bearing Restrictions: No  Pain Pain Assessment Pain Assessment: No/denies pain ADL  See Function Section of chart for details  Vision/Perception  Vision- History Baseline Vision/History: Cataracts Patient Visual Report: No change from baseline Vision- Assessment Vision Assessment?: No apparent visual deficits  Cognition Overall Cognitive Status: History of cognitive impairments - at baseline Arousal/Alertness: Awake/alert Orientation Level: Oriented to person;Oriented to situation;Oriented to place;Disoriented to time Attention: Selective;Sustained Sustained Attention: Appears intact Memory: Impaired Memory Impairment: Decreased recall of new information;Storage deficit Decreased Short Term Memory: Functional basic Awareness: Impaired Awareness Impairment: Emergent impairment;Intellectual impairment Problem Solving: Impaired Problem Solving Impairment: Functional basic;Functional complex Executive Function: Organizing;Initiating Organizing: Impaired Organizing Impairment: Functional basic Safety/Judgment: Impaired Comments: Pt with attempts to stand without assist beside of him.  Needs mod instructional cueing for sequencing bathing and dressing safely.   Sensation Sensation Light Touch: Appears Intact Stereognosis: Not tested Hot/Cold: Not tested Proprioception: Appears Intact Coordination Gross Motor Movements are Fluid and Coordinated: No Fine Motor Movements are Fluid and Coordinated: No Coordination and Movement Description: Pt with slow functional movments in BUEs secondary to Parkinsons disease. Motor  Motor Motor: Abnormal postural alignment and control Mobility  Transfers Transfers: Sit to Stand;Stand to Sit Sit to Stand: 5: Supervision;From bed;From chair/3-in-1;With armrests;From toilet;With upper extremity assist Stand to Sit:  5: Supervision;With upper extremity assist;With armrests  Trunk/Postural Assessment  Cervical Assessment Cervical Assessment: Exceptions to [facility] (cervical flexion with limited AROM in extension) Thoracic Assessment Thoracic Assessment: Exceptions to [facility] (rounded shoulders and fixed thoracic posture with decreased  mobility) Lumbar Assessment Lumbar Assessment: Exceptions to [facility] (posterior pelvic tilt)  Balance Balance Balance Assessed: Yes Static Sitting Balance Static Sitting - Balance Support: No upper extremity supported Static Sitting - Level of Assistance: 5: Stand by assistance Dynamic Sitting Balance Dynamic Sitting - Balance Support: No upper extremity supported Dynamic Sitting - Level of Assistance: 5: Stand by assistance Static Standing Balance Static Standing - Level of Assistance: 5: Stand by assistance Dynamic Standing Balance Dynamic Standing - Balance Support: Right upper extremity supported;Left upper extremity supported Dynamic Standing - Level of Assistance: 5: Stand by assistance Extremity/Trunk Assessment RUE Assessment RUE Assessment:  (slower movements with UE use with shoulder flexion bilaterally 0-130 degrees.  ) LUE Assessment LUE Assessment:  (slower movements with UE use with shoulder flexion bilaterally 0-130 degrees.  )   See Function Navigator for Current Functional Status.  Tayja Manzer OTR/L 04/20/2016, 4:12 PM

## 2016-04-20 NOTE — Progress Notes (Signed)
Patient discharged home.  Left floor via wheelchair, escorted by nursing staff and spouse.  Patient and spouse verbalized understanding of discharge instructions as given by Marlowe Shores, PA.  All patient belongings sent with patient, including DME.  Appears to be in no immediate distress at this time.  Brita Romp, RN

## 2016-04-20 NOTE — Progress Notes (Signed)
[facility] PHYSICAL MEDICINE & REHABILITATION     PROGRESS NOTE  Subjective/Complaints:  Emotional lability, but overall happy about going home.     ROS: Denies CP, SOB, N/V/D.  Objective: Vital Signs: Blood pressure (!) 160/85, pulse 74, temperature 98.1 F (36.7 C), temperature source Oral, resp. rate 17, height 5\' 10"  (1.778 m), weight 115.5 kg (254 lb 9.6 oz), SpO2 98 %. No results found. No results for input(s): WBC, HGB, HCT, PLT in the last 72 hours.  Recent Labs  04/19/16 0547  NA 142  K 3.8  CL 109  GLUCOSE 102*  BUN 11  CREATININE 0.75  CALCIUM 9.1   CBG (last 3)  No results for input(s): GLUCAP in the last 72 hours.  Wt Readings from Last 3 Encounters:  04/18/16 115.5 kg (254 lb 9.6 oz)  04/08/16 123.9 kg (273 lb 2.4 oz)  01/30/15 116.1 kg (256 lb)    Physical Exam:  BP (!) 160/85 (BP Location: Left Wrist)   Pulse 74   Temp 98.1 F (36.7 C) (Oral)   Resp 17   Ht 5\' 10"  (1.778 m)   Wt 115.5 kg (254 lb 9.6 oz)   SpO2 98%   BMI 36.53 kg/m   Gen: NAD. Vital signs reviewed.  Eyes: EOMand Conj normal.  Cardiovascular: Normal rateand regular rhythm.  Respiratory: Effort normaland breath sounds normal. No respiratory distress.  GI: Soft. Bowel sounds are normal. He exhibits no distension.  Musculoskeletal:  Tone increased with cogwheeling and rigidity Neurological: He is alert and oriented.  Motor:  Limited by rigidity B/l UE 4+/5 LUE: 2+/5 hip flexion, 3+/5 knee extension, 4+/5 ankle dorsi/plantar flexion RUE: 4+/5 hip flexion, knee extension 4+/5, ankle dorsi/plantar flexion 4+/5 Skin: Skin is warmand dry Psychiatric: His affect is blunt. His speech is slurred. He is slowed.   Assessment/Plan: 1. Functional deficits secondary to right pontine infarct which require 3+ hours per day of interdisciplinary therapy in a comprehensive inpatient rehab setting. Physiatrist is providing close team supervision and 24 hour management of active medical  problems listed below. Physiatrist and rehab team continue to assess barriers to discharge/monitor patient progress toward functional and medical goals.  Function:  Bathing Bathing position   Position: Wheelchair/chair at sink  Bathing parts Body parts bathed by patient: Right arm, Left arm, Chest, Abdomen, Front perineal area, Right upper leg, Left upper leg Body parts bathed by helper: Buttocks  Bathing assist Assist Level: Touching or steadying assistance(Pt > 75%)      Upper Body Dressing/Undressing Upper body dressing   What is the patient wearing?: Pull over shirt/dress     Pull over shirt/dress - Perfomed by patient: Thread/unthread right sleeve, Thread/unthread left sleeve, Put head through opening Pull over shirt/dress - Perfomed by helper: Pull shirt over trunk        Upper body assist Assist Level: Supervision or verbal cues      Lower Body Dressing/Undressing Lower body dressing   What is the patient wearing?: Underwear, Pants, Shoes, [employer]  - Performed by patient: Thread/unthread right underwear leg, Thread/unthread left underwear leg Underwear - Performed by helper: Pull underwear up/down Pants- Performed by patient: Thread/unthread right pants leg, Pull pants up/down Pants- Performed by helper: Thread/unthread left pants leg         Shoes - Performed by patient: Don/doff right shoe Shoes - Performed by helper: Fasten right, Don/doff left shoe, Fasten left       TED Hose - Performed by helper: Don/doff right TED hose,  Don/doff left TED hose  Lower body assist Assist for lower body dressing: Touching or steadying assistance (Pt > 75%)      Toileting Toileting Toileting activity did not occur: No continent bowel/bladder event Toileting steps completed by patient: Performs perineal hygiene, Adjust clothing prior to toileting Toileting steps completed by helper: Adjust clothing prior to toileting, Performs perineal hygiene, Adjust clothing after  toileting Toileting Assistive Devices: Grab bar or rail  Toileting assist Assist level: Touching or steadying assistance (Pt.75%)   Transfers Chair/bed transfer   Chair/bed transfer method: Stand pivot Chair/bed transfer assist level: Moderate assist (Pt 50 - 74%/lift or lower) Chair/bed transfer assistive device: Environmental manager lift: Ecologist     Max distance: 150 Assist level: Touching or steadying assistance (Pt > 75%)   Wheelchair   Type: Manual Max wheelchair distance: 150 Assist Level: Supervision or verbal cues  Cognition Comprehension Comprehension assist level: Follows basic conversation/direction with extra time/assistive device  Expression Expression assist level: Expresses basic 90% of the time/requires cueing < 10% of the time.  Social Interaction Social Interaction assist level: Interacts appropriately 75 - 89% of the time - Needs redirection for appropriate language or to initiate interaction.  Problem Solving Problem solving assist level: Solves basic 50 - 74% of the time/requires cueing 25 - 49% of the time  Memory Memory assist level: Recognizes or recalls 50 - 74% of the time/requires cueing 25 - 49% of the time    Medical Problem List and Plan: 1. Left-sided weakness secondary to right pontine infarct  Cont CIR PT, OT, SLP therapies Team conference today, plan discharge after therapy 2.  DVT Prophylaxis/Anticoagulation: Subcutaneous Lovenox. Monitor platelet counts of a signs of bleeding 3. Pain Management/Cervical spondylosis: Oxycodone as needed 4. Mood/Depression/Anxiety: Lexapro 10 mg twice a day but this may interact with MAOI Azilect---stopped. may consider Wellbutrin if pts mood declines 5. Neuropsych: This patient is capable of making decisions on his own behalf. 6. Skin/Wound Care: Routine skin checks 7. Fluids/Electrolytes/Nutrition: Routine I&O 8. Hypertension. Lisinopril 10 mg daily  Monitor with increased  activity   Vitals:   04/19/16 1459 04/20/16 0419  BP: 113/64 (!) 160/85  Pulse: 79 74  Resp: 18 17  Temp: 97.8 F (36.6 C) 98.1 F (36.7 C)   9. Parkinson's disease. Sinemet as directed, Mirapex 0.5 mg 3 times a day,Azilect 1 mg daily. 10. BPH. Flomax 0.4 mg daily.    -condom cath QHS 11. Hyperlipidemia. Lipitor 12. OA left hip:   Pt was scheduled to have injection as oupt, no complaints at present however.  Will monitor 13. Severe OSA: CPAP 14. Hypokalemia:   KCL 42meq BID  2.9 on 8/31---3.2 today--increase to 43meq BID x2 doses  -recheck level 3.8 (nl) 9/5,Continue current dose   BMP Latest Ref Rng & Units 04/19/2016 04/17/2016 04/14/2016  Glucose 65 - 99 mg/dL 102(H) 103(H) 107(H)  BUN 6 - 20 mg/dL 11 10 12   Creatinine 0.61 - 1.24 mg/dL 0.75 0.69 0.79  Sodium 135 - 145 mmol/L 142 142 141  Potassium 3.5 - 5.1 mmol/L 3.8 3.2(L) 2.9(L)  Chloride 101 - 111 mmol/L 109 109 106  CO2 22 - 32 mmol/L 26 26 27   Calcium 8.9 - 10.3 mg/dL 9.1 8.9 8.9    16. Disorientation, no severe agitation, This may be Parkinson's related rather than stroke related   scheduled trazodone to readjust sleep/wake cycle 17. Aortic valve, mild stenosis, calcification  TEE as outpt   LOS (Days) 9 A FACE  TO FACE EVALUATION WAS PERFORMED  Charlett Blake 04/20/2016 9:45 AM

## 2016-04-20 NOTE — Progress Notes (Signed)
Physical Therapy Discharge Summary  Patient Details  Name: James Schultz MRN: [id number] Date of Birth: Apr 22, 1951  Today's Date: 04/20/2016 PT Individual Time: 0900-1010 PT Individual Time Calculation (min): 70 min   Patient has met 10 of 10 long term goals due to improved activity tolerance, improved balance, improved postural control, increased strength, ability to compensate for deficits, functional use of  left upper extremity and left lower extremity, improved attention and improved awareness.  Patient to discharge at an ambulatory level Supervision.   Patient's care partner is independent to provide the necessary cognitive assistance at discharge.  Reasons goals not met: n/a  Recommendation:  Patient will benefit from ongoing skilled PT services in home health setting to continue to advance safe functional mobility, address ongoing impairments in balance, activity tolerance, motor, flexibility, mobility and locomtion, and minimize fall risk.  Equipment: No equipment provided  Reasons for discharge: treatment goals met and discharge from hospital  Patient/family agrees with progress made and goals achieved: Yes  PT Discharge Precautions/RestrictionsPrecautions Precautions: Fall Precaution Comments: Parkinson's disease Restrictions Weight Bearing Restrictions: No  Pain Pain Assessment Pain Assessment: L knee 5/10; R knee 2/10; frequent rests provided Vision/Perception    no apparent deficits Cognition Overall Cognitive Status: Impaired/Different from baseline Arousal/Alertness: Awake/alert Orientation Level: Oriented to person;Oriented to situation;Oriented to place;Disoriented to time Attention: Sustained Sustained Attention: Impaired Memory: Impaired Memory Impairment: Storage deficit;Retrieval deficit Decreased Short Term Memory: Functional basic Awareness: Impaired Awareness Impairment: Emergent impairment;Intellectual impairment Problem Solving:  Impaired Problem Solving Impairment: Functional basic;Functional complex Executive Function: Organizing;Initiating Organizing: Impaired Organizing Impairment: Functional basic Safety/Judgment: Impaired Comments: Pt with attempts to stand without assist beside of him.  Needs mod instructional cueing for sequencing bathing and dressing safely.   Sensation Sensation Light Touch: Appears Intact Stereognosis: Not tested Hot/Cold: Not tested Proprioception: Appears Intact Coordination Gross Motor Movements are Fluid and Coordinated: No Fine Motor Movements are Fluid and Coordinated: No Coordination and Movement Description: shuffling gait, narrow BOS Heel Shin Test: RLE WNLs; LLE with reduced excursion and speed, limited by hip stiffness, per pt Motor  Motor Motor: Abnormal postural alignment and control Motor - Skilled Clinical Observations: flexed posture Motor - Discharge Observations: flexed posture, ER bil hips with narrow BOS at heels  Mobility Bed Mobility Bed Mobility: Rolling Right;Rolling Left;Right Sidelying to Sit Rolling Right: 5: Supervision Rolling Right Details: Verbal cues for technique Rolling Left: 5: Supervision Rolling Left Details: Verbal cues for technique Right Sidelying to Sit: 5: Supervision Right Sidelying to Sit Details: Verbal cues for technique Right Sidelying to Sit Details (indicate cue type and reason): pt tends to try to sit up without rolling to side Transfers Transfers: Yes Sit to Stand: 5: Supervision Sit to Stand Details: Verbal cues for safe use of DME/AE Stand to Sit: 5: Supervision Stand to Sit Details (indicate cue type and reason): Verbal cues for safe use of DME/AE Stand Pivot Transfers: 5: Supervision Locomotion  Ambulation Ambulation: Yes Ambulation/Gait Assistance: 5: Supervision Ambulation Distance (Feet): 150 Feet Assistive device: Rolling walker Gait Gait: Yes Gait Pattern: Impaired Gait Pattern: Right flexed knee in  stance;Left flexed knee in stance;Decreased hip/knee flexion - right;Poor foot clearance - right;Trunk flexed;Decreased hip/knee flexion - left;Step-through pattern Gait velocity: 1.43'/sec on 04/18/16 Stairs / Additional Locomotion Stairs: Yes Stairs Assistance: 5: Supervision Stairs Assistance Details: Verbal cues for gait pattern Stair Management Technique: Two rails Curb: 4: Min Paediatric nurse: Yes Wheelchair Assistance: 5: Investment banker, operational: Both lower extermities;Both upper extremities Wheelchair Parts  Management: Needs assistance Distance: 150  Trunk/Postural Assessment  Cervical Assessment Cervical Assessment: Exceptions to [facility] (cervical flexion with limited AROM in extension) Thoracic Assessment Thoracic Assessment: Exceptions to [facility] (rounded shoulders and fixed thoracic posture with decreased mobility) Lumbar Assessment Lumbar Assessment: Exceptions to [facility] (posterior pelvic tilt) Postural Control Postural Control: Deficits on evaluation Righting Reactions: delayed Protective Responses: absent  Balance Balance Balance Assessed: Yes Static Sitting Balance Static Sitting - Balance Support: No upper extremity supported Static Sitting - Level of Assistance: 5: Stand by assistance Dynamic Sitting Balance Dynamic Sitting - Balance Support: No upper extremity supported Dynamic Sitting - Level of Assistance: 5: Stand by assistance Static Standing Balance Static Standing - Level of Assistance: 5: Stand by assistance Dynamic Standing Balance Dynamic Standing - Balance Support: Right upper extremity supported;Left upper extremity supported Dynamic Standing - Level of Assistance: 5: Stand by assistance Extremity Assessment  RUE Assessment RUE Assessment:  (slower movements with UE use with shoulder flexion bilaterally 0-130 degrees.  ) LUE Assessment LUE Assessment:  (slower movements with UE use with shoulder flexion bilaterally  0-130 degrees.  ) RLE Assessment RLE Assessment: Within Functional Limits LLE Assessment LLE Assessment: Within Functional Limits (strength grossly WFLS; knee painful, tight and tender over IT band especially distally)   See Function Navigator for Current Functional Status.  James Schultz 04/20/2016, 4:54 PM

## 2016-04-20 NOTE — Discharge Summary (Signed)
James Schultz, James Schultz NO.:  [id number]  MEDICAL RECORD NO.:  [id number]  LOCATION:  4W12C                        FACILITY:  [facility]  PHYSICIAN:  Charlett Blake, M.D.DATE OF BIRTH:  [birth year]  DATE OF ADMISSION:  04/11/2016 DATE OF DISCHARGE:  04/20/2016                              DISCHARGE SUMMARY   DISCHARGE DIAGNOSES: 1. Right pontine infarct. 2. Subcutaneous Lovenox for deep venous thrombosis prophylaxis. 3. Depression. 4. Hypertension. 5. Parkinson disease. 6. Benign prostatic hypertrophy. 7. Hyperlipidemia. 8. Severe obstructive sleep apnea. 9. Hypokalemia, resolved. 10.Aortic valve, mild stenosis.  HISTORY OF PRESENT ILLNESS:  This is a 65 year old, right-handed male, history of Parkinson disease.  Lives with spouse in 1-level home.  Used a rolling walker prior to admission.  Presented on April 08, 2016, after reported syncopal episode, left-sided weakness.  MRI showed right pontine infarct.  MRA with no emergent large vessel occlusion or stenosis.  Echocardiogram showed ejection fraction of [id number], grade 1 diastolic dysfunction.  Carotid Dopplers with no ICA stenosis.  The patient did not receive tPA.  Placed on Plavix for CVA prophylaxis. Subcutaneous Lovenox for DVT prophylaxis.  The patient was admitted for a comprehensive rehab program.  PAST MEDICAL HISTORY:  See discharge diagnoses.  SOCIAL HISTORY:  Lives with spouse.  Used a rolling walker prior to admission.  Functional status upon admission to rehab services was +2 physical assist, ambulation 2 feet rolling walker, moderate assist stand pivot transfers, mod-to-max assist with activities of daily living.  PHYSICAL EXAMINATION:  VITAL SIGNS:  Blood pressure 96/57, pulse 82, temperature 98, respirations 16. GENERAL:  This was an alert male.  He was able to provide his name, place, date of birth.  Tone increased with cogwheeling and rigidity. LUNGS:  Clear to auscultation.  No  wheeze. CARDIAC:  Regular rate and rhythm.  No murmur. ABDOMEN:  Soft, nontender.  Good bowel sounds.  REHABILITATION HOSPITAL COURSE:  The patient was admitted to [facility] with therapies initiated on a 3-hour daily basis, consisting of physical therapy, occupational therapy, speech therapy, and rehabilitation nursing.  The following issues were addressed during the patient's rehabilitation stay.  Pertaining to [name] right pontine infarct remained stable, maintained on Plavix therapy, he would follow up with [facility].  Subcutaneous Lovenox for DVT prophylaxis.  He was on Lexapro for history of depression, but this was stopped as it was felt to possibly interact with Azilect.  Blood pressure is well controlled.  He continued on Sinemet as well as Mirapex and Azilect for his Parkinson disease.  Voiding without difficulty. Maintained on Flomax.  Bouts of hypokalemia resolved with potassium supplement.  The patient received weekly collaborative interdisciplinary team conferences to discuss estimated length of stay, family teaching, any barriers to his discharge.  Sessions focused on family education. The patient performed gait in the home in controlled environment with wife providing close supervision.  Practice up and down stairs.  Close min assist, close supervision.  Car transfers, supervision.  He could gather his belongings for activities of daily living home making.  He needed some assistance for lower body dressing.  He was maintained on a regular consistency diet.  Speech therapy followup sessions  working with verbal and visual cues for re-orientation to date and plan.  Working with sustained attention during familiar card tasks.  Full family teaching was completed.  PLAN:  Discharge to home.  DISCHARGE MEDICATIONS:  At time of dictation included: 1. Lipitor 20 mg p.o. daily. 2. Sinemet 50/200 one tablet at bedtime 1-1/2 tablets t.i.d. and  25-     100 two tablets daily. 3. Plavix 75 mg p.o. daily. 4. Continue CPAP. 5. Lisinopril 10 mg p.o. daily. 6. Oxycodone 1 tablet every 6 hours as needed for severe pain of hip,     dispensed #10. 7. Mirapex 0.5 mg p.o. t.i.d. 8. Azilect 1 mg p.o. daily. 9. Flomax 0.4 mg p.o. at bedtime. 10.Trazodone 50 mg p.o. at bedtime.  DIET:  Regular.  FOLLOWUP:  The patient would follow up with Dr. Alysia Penna at the outpatient rehab center as advised; Dr. Sherrian Divers at [facility], call for appointment; Dr. Aldean Ast, medical management.  The patient should follow up at [facility] for ongoing care of Parkinson disease.     Lauraine Rinne, P.A.   ______________________________ Charlett Blake, M.D.    DA/MEDQ  D:  04/19/2016  T:  04/20/2016  Job:  [id number]  cc:   Dr. Woody Seller, Primary Care Physician Dr. Sherrian Divers at [facility] Charlett Blake, M.D.

## 2016-04-20 NOTE — Progress Notes (Signed)
Speech Language Pathology Discharge Summary  Patient Details  Name: James Schultz MRN: [id number] Date of Birth: 1951/07/25  Today's Date: 04/20/2016 SLP Individual Time: 1136-1200 SLP Individual Time Calculation (min): 24 min    Skilled Therapeutic Interventions:  Pt was seen for skilled ST targeting family education prior to discharge.  Pt's wife was present throughout today's therapy session and remained actively engaged in all training.  Therapist discussed pt's current level of function and progress in comparison to initial evaluation.  SLP also provided skilled education regarding memory compensatory strategies to maximize functional independence for cognition in the home environment. Pt's wife presented with questions regarding activities pt can do at home to continue to address cognitive function.  SLP provided pt's wife with activities such as word searches, card games, puzzles, crossword puzzles, etc that will target problem solving, recall, and attention to tasks.  Pt's wife reports that, other than increased confusion overnight, pt is largely returned to his baseline for cognition.  All questions were answered to pt's and wife's satisfaction at this time.  Pt was left in wheelchair with wife at bedside.  Pt is ready for discharge today.      Patient has met 3 of 3 long term goals.  Patient to discharge at [facility] level.  Reasons goals not met: n/a   Clinical Impression/Discharge Summary:  Pt has made excellent gains while inpatient and is discharging having met 3 out of 3 long term goals.  Pt is currently min assist for basic tasks due to mild cognitive impairment impacting storage and retrieval of information and functional problem solving .  Suspect pt is near his baseline for cognition per pt's and wife's report.  Pt is discharging home with 24/7 supervision from wife.  Pt and family education is complete at this time.  Pt may benefit from brief ST follow up at next level of  care; however, anticipate that pt will quickly return to baseline once in his familiar environment.   Care Partner:  Caregiver Able to Provide Assistance: Yes  Type of Caregiver Assistance: Physical;Cognitive  Recommendation:  24 hour supervision/assistance;Home Health SLP  Rationale for SLP Follow Up: Maximize cognitive function and independence;Reduce caregiver burden   Equipment: none recommended by SLP    Reasons for discharge: Discharged from hospital   Patient/Family Agrees with Progress Made and Goals Achieved: Yes   Function:  Eating Eating                Cognition Comprehension Comprehension assist level: Follows basic conversation/direction with extra time/assistive device  Expression   Expression assist level: Expresses basic needs/ideas: With extra time/assistive device  Social Interaction Social Interaction assist level: Interacts appropriately 75 - 89% of the time - Needs redirection for appropriate language or to initiate interaction.  Problem Solving Problem solving assist level: Solves basic 75 - 89% of the time/requires cueing 10 - 24% of the time  Memory Memory assist level: Recognizes or recalls 75 - 89% of the time/requires cueing 10 - 24% of the time   Emilio Math 04/20/2016, 3:58 PM

## 2016-04-21 ENCOUNTER — Telehealth: Payer: Self-pay | Admitting: Neurology

## 2016-04-21 DIAGNOSIS — I69354 Hemiplegia and hemiparesis following cerebral infarction affecting left non-dominant side: Secondary | ICD-10-CM | POA: Diagnosis not present

## 2016-04-21 DIAGNOSIS — Z9989 Dependence on other enabling machines and devices: Principal | ICD-10-CM

## 2016-04-21 DIAGNOSIS — I10 Essential (primary) hypertension: Secondary | ICD-10-CM | POA: Diagnosis not present

## 2016-04-21 DIAGNOSIS — G4733 Obstructive sleep apnea (adult) (pediatric): Secondary | ICD-10-CM | POA: Diagnosis not present

## 2016-04-21 DIAGNOSIS — I69318 Other symptoms and signs involving cognitive functions following cerebral infarction: Secondary | ICD-10-CM | POA: Diagnosis not present

## 2016-04-21 DIAGNOSIS — M47812 Spondylosis without myelopathy or radiculopathy, cervical region: Secondary | ICD-10-CM | POA: Diagnosis not present

## 2016-04-21 DIAGNOSIS — G20 Parkinson's disease: Secondary | ICD-10-CM | POA: Diagnosis not present

## 2016-04-21 NOTE — Telephone Encounter (Signed)
I called Almyra Free RN back. Patient is asking if an order for new CPAP supplies (specifically mask and filter) be sent into [facility] before next office visit. They are aware that Dr. Rexene Alberts is out of the office until 04/26/16 and will address then. Almyra Free advised that she will call the wife back and let her know.

## 2016-04-21 NOTE — Telephone Encounter (Signed)
Almyra Free RN/[facility] [id number] called said the pt is having a lot of difficulty sleeping and is keeping his wife a wake also.  She said his wife said he needs a filter. Can he be seen sooner than 9/26. Please call Coral Else

## 2016-04-22 DIAGNOSIS — I69318 Other symptoms and signs involving cognitive functions following cerebral infarction: Secondary | ICD-10-CM | POA: Diagnosis not present

## 2016-04-22 DIAGNOSIS — G20 Parkinson's disease: Secondary | ICD-10-CM | POA: Diagnosis not present

## 2016-04-22 DIAGNOSIS — I10 Essential (primary) hypertension: Secondary | ICD-10-CM | POA: Diagnosis not present

## 2016-04-22 DIAGNOSIS — G4733 Obstructive sleep apnea (adult) (pediatric): Secondary | ICD-10-CM | POA: Diagnosis not present

## 2016-04-22 DIAGNOSIS — I69354 Hemiplegia and hemiparesis following cerebral infarction affecting left non-dominant side: Secondary | ICD-10-CM | POA: Diagnosis not present

## 2016-04-22 DIAGNOSIS — M47812 Spondylosis without myelopathy or radiculopathy, cervical region: Secondary | ICD-10-CM | POA: Diagnosis not present

## 2016-04-25 DIAGNOSIS — G4733 Obstructive sleep apnea (adult) (pediatric): Secondary | ICD-10-CM | POA: Diagnosis not present

## 2016-04-25 DIAGNOSIS — G20 Parkinson's disease: Secondary | ICD-10-CM | POA: Diagnosis not present

## 2016-04-25 DIAGNOSIS — I69318 Other symptoms and signs involving cognitive functions following cerebral infarction: Secondary | ICD-10-CM | POA: Diagnosis not present

## 2016-04-25 DIAGNOSIS — M47812 Spondylosis without myelopathy or radiculopathy, cervical region: Secondary | ICD-10-CM | POA: Diagnosis not present

## 2016-04-25 DIAGNOSIS — I10 Essential (primary) hypertension: Secondary | ICD-10-CM | POA: Diagnosis not present

## 2016-04-25 DIAGNOSIS — I69354 Hemiplegia and hemiparesis following cerebral infarction affecting left non-dominant side: Secondary | ICD-10-CM | POA: Diagnosis not present

## 2016-04-26 DIAGNOSIS — I69354 Hemiplegia and hemiparesis following cerebral infarction affecting left non-dominant side: Secondary | ICD-10-CM | POA: Diagnosis not present

## 2016-04-26 DIAGNOSIS — M47812 Spondylosis without myelopathy or radiculopathy, cervical region: Secondary | ICD-10-CM | POA: Diagnosis not present

## 2016-04-26 DIAGNOSIS — I69318 Other symptoms and signs involving cognitive functions following cerebral infarction: Secondary | ICD-10-CM | POA: Diagnosis not present

## 2016-04-26 DIAGNOSIS — I10 Essential (primary) hypertension: Secondary | ICD-10-CM | POA: Diagnosis not present

## 2016-04-26 DIAGNOSIS — G4733 Obstructive sleep apnea (adult) (pediatric): Secondary | ICD-10-CM | POA: Diagnosis not present

## 2016-04-26 DIAGNOSIS — G20 Parkinson's disease: Secondary | ICD-10-CM | POA: Diagnosis not present

## 2016-04-26 NOTE — Telephone Encounter (Signed)
James Schultz with [facility] has been notified.  

## 2016-04-26 NOTE — Telephone Encounter (Signed)
pls process cpap supply order, thx

## 2016-04-26 NOTE — Addendum Note (Signed)
Addended by: Star Age on: 04/26/2016 09:13 AM   Modules accepted: Orders

## 2016-04-27 DIAGNOSIS — I10 Essential (primary) hypertension: Secondary | ICD-10-CM | POA: Diagnosis not present

## 2016-04-27 DIAGNOSIS — M47812 Spondylosis without myelopathy or radiculopathy, cervical region: Secondary | ICD-10-CM | POA: Diagnosis not present

## 2016-04-27 DIAGNOSIS — I69354 Hemiplegia and hemiparesis following cerebral infarction affecting left non-dominant side: Secondary | ICD-10-CM | POA: Diagnosis not present

## 2016-04-27 DIAGNOSIS — I69318 Other symptoms and signs involving cognitive functions following cerebral infarction: Secondary | ICD-10-CM | POA: Diagnosis not present

## 2016-04-27 DIAGNOSIS — G20 Parkinson's disease: Secondary | ICD-10-CM | POA: Diagnosis not present

## 2016-04-27 DIAGNOSIS — G4733 Obstructive sleep apnea (adult) (pediatric): Secondary | ICD-10-CM | POA: Diagnosis not present

## 2016-04-28 DIAGNOSIS — I10 Essential (primary) hypertension: Secondary | ICD-10-CM | POA: Diagnosis not present

## 2016-04-28 DIAGNOSIS — I69318 Other symptoms and signs involving cognitive functions following cerebral infarction: Secondary | ICD-10-CM | POA: Diagnosis not present

## 2016-04-28 DIAGNOSIS — M47812 Spondylosis without myelopathy or radiculopathy, cervical region: Secondary | ICD-10-CM | POA: Diagnosis not present

## 2016-04-28 DIAGNOSIS — G4733 Obstructive sleep apnea (adult) (pediatric): Secondary | ICD-10-CM | POA: Diagnosis not present

## 2016-04-28 DIAGNOSIS — I69354 Hemiplegia and hemiparesis following cerebral infarction affecting left non-dominant side: Secondary | ICD-10-CM | POA: Diagnosis not present

## 2016-04-28 DIAGNOSIS — G20 Parkinson's disease: Secondary | ICD-10-CM | POA: Diagnosis not present

## 2016-04-28 DIAGNOSIS — F329 Major depressive disorder, single episode, unspecified: Secondary | ICD-10-CM | POA: Diagnosis not present

## 2016-04-28 DIAGNOSIS — Z299 Encounter for prophylactic measures, unspecified: Secondary | ICD-10-CM | POA: Diagnosis not present

## 2016-04-28 DIAGNOSIS — Z09 Encounter for follow-up examination after completed treatment for conditions other than malignant neoplasm: Secondary | ICD-10-CM | POA: Diagnosis not present

## 2016-04-28 DIAGNOSIS — I6350 Cerebral infarction due to unspecified occlusion or stenosis of unspecified cerebral artery: Secondary | ICD-10-CM | POA: Diagnosis not present

## 2016-04-29 ENCOUNTER — Ambulatory Visit (HOSPITAL_BASED_OUTPATIENT_CLINIC_OR_DEPARTMENT_OTHER): Payer: Medicare Other | Admitting: Physical Medicine & Rehabilitation

## 2016-04-29 ENCOUNTER — Encounter: Payer: Medicare Other | Attending: Physical Medicine & Rehabilitation

## 2016-04-29 ENCOUNTER — Encounter: Payer: Self-pay | Admitting: Physical Medicine & Rehabilitation

## 2016-04-29 VITALS — BP 129/78 | HR 80 | Resp 14

## 2016-04-29 DIAGNOSIS — G4733 Obstructive sleep apnea (adult) (pediatric): Secondary | ICD-10-CM | POA: Insufficient documentation

## 2016-04-29 DIAGNOSIS — Z8673 Personal history of transient ischemic attack (TIA), and cerebral infarction without residual deficits: Secondary | ICD-10-CM

## 2016-04-29 DIAGNOSIS — I69319 Unspecified symptoms and signs involving cognitive functions following cerebral infarction: Secondary | ICD-10-CM | POA: Insufficient documentation

## 2016-04-29 DIAGNOSIS — I69318 Other symptoms and signs involving cognitive functions following cerebral infarction: Secondary | ICD-10-CM | POA: Diagnosis not present

## 2016-04-29 DIAGNOSIS — F329 Major depressive disorder, single episode, unspecified: Secondary | ICD-10-CM | POA: Diagnosis not present

## 2016-04-29 DIAGNOSIS — F419 Anxiety disorder, unspecified: Secondary | ICD-10-CM | POA: Diagnosis not present

## 2016-04-29 DIAGNOSIS — I639 Cerebral infarction, unspecified: Secondary | ICD-10-CM | POA: Diagnosis not present

## 2016-04-29 DIAGNOSIS — G20 Parkinson's disease: Secondary | ICD-10-CM | POA: Insufficient documentation

## 2016-04-29 DIAGNOSIS — M5030 Other cervical disc degeneration, unspecified cervical region: Secondary | ICD-10-CM | POA: Insufficient documentation

## 2016-04-29 DIAGNOSIS — Z5189 Encounter for other specified aftercare: Secondary | ICD-10-CM | POA: Insufficient documentation

## 2016-04-29 DIAGNOSIS — I69398 Other sequelae of cerebral infarction: Secondary | ICD-10-CM | POA: Diagnosis not present

## 2016-04-29 DIAGNOSIS — I10 Essential (primary) hypertension: Secondary | ICD-10-CM | POA: Insufficient documentation

## 2016-04-29 DIAGNOSIS — I69354 Hemiplegia and hemiparesis following cerebral infarction affecting left non-dominant side: Secondary | ICD-10-CM | POA: Diagnosis not present

## 2016-04-29 DIAGNOSIS — M47892 Other spondylosis, cervical region: Secondary | ICD-10-CM | POA: Insufficient documentation

## 2016-04-29 DIAGNOSIS — I6350 Cerebral infarction due to unspecified occlusion or stenosis of unspecified cerebral artery: Secondary | ICD-10-CM

## 2016-04-29 DIAGNOSIS — R51 Headache: Secondary | ICD-10-CM | POA: Insufficient documentation

## 2016-04-29 DIAGNOSIS — R269 Unspecified abnormalities of gait and mobility: Secondary | ICD-10-CM

## 2016-04-29 DIAGNOSIS — M47812 Spondylosis without myelopathy or radiculopathy, cervical region: Secondary | ICD-10-CM | POA: Diagnosis not present

## 2016-04-29 DIAGNOSIS — M1990 Unspecified osteoarthritis, unspecified site: Secondary | ICD-10-CM | POA: Insufficient documentation

## 2016-04-29 NOTE — Progress Notes (Signed)
Subjective:    Patient ID: James Schultz, male    DOB: 05-Mar-1951, 65 y.o.   MRN: [id number] 65 year old, right-handed male, history of Parkinson disease.  Lives with spouse in 1-level home.  Used a rolling walker prior to admission.  Presented on April 08, 2016, after reported syncopal episode, left-sided weakness.  MRI showed right pontine infarct.  MRA with no emergent large vessel occlusion or stenosis.  Echocardiogram showed ejection fraction of [id number], grade 1 diastolic dysfunction.  Carotid Dopplers with no ICA stenosis.  The patient did not receive tPA.   HPI Patient at home receiving home health therapy, PTand OT Wife notes confusion and emotional lability.  Physically not much different than prior to CVA Pain Inventory Average Pain 7 Pain Right Now 7 My pain is stabbing  In the last 24 hours, has pain interfered with the following? General activity 7 Relation with others 7 Enjoyment of life 7 What TIME of day is your pain at its worst? evening Sleep (in general) Poor  Pain is worse with: walking, bending, standing and some activites Pain improves with: therapy/exercise and injections Relief from Meds: 5  Mobility walk with assistance use a walker how many minutes can you walk? 15 ability to climb steps?  yes do you drive?  yes needs help with transfers  Function disabled: date disabled . I need assistance with the following:  dressing and bathing  Neuro/Psych bladder control problems bowel control problems weakness numbness tremor tingling trouble walking dizziness confusion depression anxiety  Prior Studies hospital follow up  Physicians involved in your care hospital follow up   Family History  Problem Relation Age of Onset  . Cancer Mother   . Parkinson's disease      H/O  . Alzheimer's disease      H/O  . ALS      H/O   Social History   Social History  . Marital status: Married    Spouse name: [place]  . Number of  children: 1  . Years of education: Bachelor's   Occupational History  . RETIRED Not Employed   Social History Main Topics  . Smoking status: Never Smoker  . Smokeless tobacco: Never Used  . Alcohol use No  . Drug use: No  . Sexual activity: Not Asked   Other Topics Concern  . None   Social History Narrative   Patient is married and lives at home with his wife ([place]), has 1 child   Patient is right handed   Education level is Bachelor's   Caffeine consumption is 3 cups daily   Past Surgical History:  Procedure Laterality Date  . CHOLECYSTECTOMY  2000  . CYSTOSCOPY WITH RETROGRADE PYELOGRAM, URETEROSCOPY AND STENT PLACEMENT Right 11/13/2013   Procedure: CYSTOSCOPY WITH RIGHT URETEROSCOPY, Right Retrograde Pyelogram, RIGHT URETERAL STENT PLACEMENT, Basket Stone Extraction;  Surgeon: Ardis Hughs, MD;  Location: [facility];  Service: Urology;  Laterality: Right;  . HIP PINNING,CANNULATED Right 04/13/2013   Procedure: CANNULATED HIP PINNING;  Surgeon: Johnn Hai, MD;  Location: [facility];  Service: Orthopedics;  Laterality: Right;  PERCUTANEOUS CANNULATED RIGHT HIP PINNING   . HOLMIUM LASER APPLICATION Right [id number]   Procedure: RIGHT LASER LITHOTRIPSY;  Surgeon: Ardis Hughs, MD;  Location: [facility];  Service: Urology;  Laterality: Right;  . KNEE ARTHROSCOPY W/ DEBRIDEMENT Bilateral RIGHT  04-07-2008/   LEFT 10-13-2005  . LUMBAR LAMINECTOMY  01-05-2010   L2  --  L5  . TOTAL  KNEE ARTHROPLASTY Left 03/04/2013   Procedure: LEFT TOTAL KNEE ARTHROPLASTY;  Surgeon: Gearlean Alf, MD;  Location: [facility];  Service: Orthopedics;  Laterality: Left;   Past Medical History:  Diagnosis Date  . Anxiety   . Cervical spondylosis   . Degenerative disc disease, cervical   . Depression   . Headache(784.0)   . Hypertension   . OA (osteoarthritis)   . OSA (obstructive sleep apnea)    SEVERE OSA PER STUDY 2005--  USES NASAL CANNULA  WITH SETTING  AT 12 -- NO MASK  . Parkinson's disease ([place]) NEUROLOGIST--  DR Jim Like   IN A STUDY AT [facility]--- lov note crae everywhere 02-19-2013 dr Kalman Shan scott  . Right ureteral stone   . Spinal stenosis of lumbar region   . Urinary incontinence    SECONDARY TO PARKINSON'S DISEASE  . Wears glasses    BP 129/78 (BP Location: Left Arm, Patient Position: Sitting, Cuff Size: Large)   Pulse 80   Resp 14   SpO2 93%   Opioid Risk Score:   Fall Risk Score:  `1  Depression screen PHQ 2/9  No flowsheet data found.  Review of Systems  Constitutional: Positive for diaphoresis.  Respiratory: Positive for apnea, cough and shortness of breath.   Gastrointestinal: Positive for constipation, diarrhea and nausea.  Genitourinary: Positive for difficulty urinating.  Musculoskeletal: Positive for gait problem.  Skin: Positive for rash.  Neurological: Positive for dizziness, tremors, weakness and numbness.       Tingling  Psychiatric/Behavioral: Positive for confusion, dysphoric mood and suicidal ideas. The patient is nervous/anxious.   All other systems reviewed and are negative. oriented to person , not time,      Objective:   Physical Exam  Constitutional: He appears well-developed and well-nourished.  HENT:  Head: Normocephalic and atraumatic.  Eyes: Conjunctivae and EOM are normal. Pupils are equal, round, and reactive to light.  Neurological: He is alert. He has normal strength. No sensory deficit. Gait abnormal.  Oriented to person, but not time Decreased balance, uses walker  Psychiatric: His affect is labile. His speech is delayed. He is slowed. Thought content is delusional. Cognition and memory are impaired. He is inattentive.  Nursing note and vitals reviewed.         Assessment & Plan:  1.  Right pontine infarct- obility is improving aalmost back to baseline, however, still has cognitive deficits s well as emotional lability. Would recommend outpatient speech therapy once. Home  health therapy discontinues.  Will need neurology follow-up for stroke as well as for Parkinson's disease  Also has a neurologist for sleep disorder  I do not think he needs. PM and R follow-up  Has already seen primary care

## 2016-04-29 NOTE — Patient Instructions (Addendum)
Needs to see Neurologist for stroke follow up in November  Will need outpatient speech therapy after [place] finished

## 2016-05-02 DIAGNOSIS — I69354 Hemiplegia and hemiparesis following cerebral infarction affecting left non-dominant side: Secondary | ICD-10-CM | POA: Diagnosis not present

## 2016-05-02 DIAGNOSIS — M47812 Spondylosis without myelopathy or radiculopathy, cervical region: Secondary | ICD-10-CM | POA: Diagnosis not present

## 2016-05-02 DIAGNOSIS — G20 Parkinson's disease: Secondary | ICD-10-CM | POA: Diagnosis not present

## 2016-05-02 DIAGNOSIS — I10 Essential (primary) hypertension: Secondary | ICD-10-CM | POA: Diagnosis not present

## 2016-05-02 DIAGNOSIS — G4733 Obstructive sleep apnea (adult) (pediatric): Secondary | ICD-10-CM | POA: Diagnosis not present

## 2016-05-02 DIAGNOSIS — I69318 Other symptoms and signs involving cognitive functions following cerebral infarction: Secondary | ICD-10-CM | POA: Diagnosis not present

## 2016-05-03 DIAGNOSIS — G20 Parkinson's disease: Secondary | ICD-10-CM | POA: Diagnosis not present

## 2016-05-03 DIAGNOSIS — G4733 Obstructive sleep apnea (adult) (pediatric): Secondary | ICD-10-CM | POA: Diagnosis not present

## 2016-05-03 DIAGNOSIS — I69354 Hemiplegia and hemiparesis following cerebral infarction affecting left non-dominant side: Secondary | ICD-10-CM | POA: Diagnosis not present

## 2016-05-03 DIAGNOSIS — I69318 Other symptoms and signs involving cognitive functions following cerebral infarction: Secondary | ICD-10-CM | POA: Diagnosis not present

## 2016-05-03 DIAGNOSIS — I10 Essential (primary) hypertension: Secondary | ICD-10-CM | POA: Diagnosis not present

## 2016-05-03 DIAGNOSIS — M47812 Spondylosis without myelopathy or radiculopathy, cervical region: Secondary | ICD-10-CM | POA: Diagnosis not present

## 2016-05-04 DIAGNOSIS — G20 Parkinson's disease: Secondary | ICD-10-CM | POA: Diagnosis not present

## 2016-05-04 DIAGNOSIS — I69354 Hemiplegia and hemiparesis following cerebral infarction affecting left non-dominant side: Secondary | ICD-10-CM | POA: Diagnosis not present

## 2016-05-04 DIAGNOSIS — M47812 Spondylosis without myelopathy or radiculopathy, cervical region: Secondary | ICD-10-CM | POA: Diagnosis not present

## 2016-05-04 DIAGNOSIS — G4733 Obstructive sleep apnea (adult) (pediatric): Secondary | ICD-10-CM | POA: Diagnosis not present

## 2016-05-04 DIAGNOSIS — I69318 Other symptoms and signs involving cognitive functions following cerebral infarction: Secondary | ICD-10-CM | POA: Diagnosis not present

## 2016-05-04 DIAGNOSIS — I10 Essential (primary) hypertension: Secondary | ICD-10-CM | POA: Diagnosis not present

## 2016-05-05 DIAGNOSIS — G4733 Obstructive sleep apnea (adult) (pediatric): Secondary | ICD-10-CM | POA: Diagnosis not present

## 2016-05-05 DIAGNOSIS — G20 Parkinson's disease: Secondary | ICD-10-CM | POA: Diagnosis not present

## 2016-05-05 DIAGNOSIS — I69318 Other symptoms and signs involving cognitive functions following cerebral infarction: Secondary | ICD-10-CM | POA: Diagnosis not present

## 2016-05-05 DIAGNOSIS — M47812 Spondylosis without myelopathy or radiculopathy, cervical region: Secondary | ICD-10-CM | POA: Diagnosis not present

## 2016-05-05 DIAGNOSIS — I69354 Hemiplegia and hemiparesis following cerebral infarction affecting left non-dominant side: Secondary | ICD-10-CM | POA: Diagnosis not present

## 2016-05-05 DIAGNOSIS — I10 Essential (primary) hypertension: Secondary | ICD-10-CM | POA: Diagnosis not present

## 2016-05-09 DIAGNOSIS — I69318 Other symptoms and signs involving cognitive functions following cerebral infarction: Secondary | ICD-10-CM | POA: Diagnosis not present

## 2016-05-09 DIAGNOSIS — G20 Parkinson's disease: Secondary | ICD-10-CM | POA: Diagnosis not present

## 2016-05-09 DIAGNOSIS — I10 Essential (primary) hypertension: Secondary | ICD-10-CM | POA: Diagnosis not present

## 2016-05-09 DIAGNOSIS — G4733 Obstructive sleep apnea (adult) (pediatric): Secondary | ICD-10-CM | POA: Diagnosis not present

## 2016-05-09 DIAGNOSIS — M47812 Spondylosis without myelopathy or radiculopathy, cervical region: Secondary | ICD-10-CM | POA: Diagnosis not present

## 2016-05-09 DIAGNOSIS — I69354 Hemiplegia and hemiparesis following cerebral infarction affecting left non-dominant side: Secondary | ICD-10-CM | POA: Diagnosis not present

## 2016-05-10 ENCOUNTER — Telehealth: Payer: Self-pay

## 2016-05-10 ENCOUNTER — Ambulatory Visit: Payer: Medicare Other | Admitting: Neurology

## 2016-05-10 DIAGNOSIS — G4733 Obstructive sleep apnea (adult) (pediatric): Secondary | ICD-10-CM | POA: Diagnosis not present

## 2016-05-10 DIAGNOSIS — I69354 Hemiplegia and hemiparesis following cerebral infarction affecting left non-dominant side: Secondary | ICD-10-CM | POA: Diagnosis not present

## 2016-05-10 DIAGNOSIS — M47812 Spondylosis without myelopathy or radiculopathy, cervical region: Secondary | ICD-10-CM | POA: Diagnosis not present

## 2016-05-10 DIAGNOSIS — I69318 Other symptoms and signs involving cognitive functions following cerebral infarction: Secondary | ICD-10-CM | POA: Diagnosis not present

## 2016-05-10 DIAGNOSIS — G20 Parkinson's disease: Secondary | ICD-10-CM | POA: Diagnosis not present

## 2016-05-10 DIAGNOSIS — I10 Essential (primary) hypertension: Secondary | ICD-10-CM | POA: Diagnosis not present

## 2016-05-10 NOTE — Telephone Encounter (Signed)
I spoke to patient's wife and reschedule patient's appt for 9/28, provider out sick.

## 2016-05-11 ENCOUNTER — Telehealth: Payer: Self-pay

## 2016-05-11 DIAGNOSIS — I10 Essential (primary) hypertension: Secondary | ICD-10-CM | POA: Diagnosis not present

## 2016-05-11 DIAGNOSIS — I69354 Hemiplegia and hemiparesis following cerebral infarction affecting left non-dominant side: Secondary | ICD-10-CM | POA: Diagnosis not present

## 2016-05-11 DIAGNOSIS — G4733 Obstructive sleep apnea (adult) (pediatric): Secondary | ICD-10-CM | POA: Diagnosis not present

## 2016-05-11 DIAGNOSIS — G20 Parkinson's disease: Secondary | ICD-10-CM | POA: Diagnosis not present

## 2016-05-11 DIAGNOSIS — M47812 Spondylosis without myelopathy or radiculopathy, cervical region: Secondary | ICD-10-CM | POA: Diagnosis not present

## 2016-05-11 DIAGNOSIS — I69318 Other symptoms and signs involving cognitive functions following cerebral infarction: Secondary | ICD-10-CM | POA: Diagnosis not present

## 2016-05-11 NOTE — Telephone Encounter (Signed)
LM for wife to call back and reschedule appt for tomorrow. Provider out sick. I advised her that I will also call back if something sooner opens up.

## 2016-05-12 ENCOUNTER — Ambulatory Visit: Payer: Self-pay | Admitting: Neurology

## 2016-05-12 NOTE — Telephone Encounter (Signed)
I called back, wife answered and we were able to get patient in on Monday.

## 2016-05-13 DIAGNOSIS — G20 Parkinson's disease: Secondary | ICD-10-CM | POA: Diagnosis not present

## 2016-05-13 DIAGNOSIS — M47812 Spondylosis without myelopathy or radiculopathy, cervical region: Secondary | ICD-10-CM | POA: Diagnosis not present

## 2016-05-13 DIAGNOSIS — I69318 Other symptoms and signs involving cognitive functions following cerebral infarction: Secondary | ICD-10-CM | POA: Diagnosis not present

## 2016-05-13 DIAGNOSIS — G4733 Obstructive sleep apnea (adult) (pediatric): Secondary | ICD-10-CM | POA: Diagnosis not present

## 2016-05-13 DIAGNOSIS — I69354 Hemiplegia and hemiparesis following cerebral infarction affecting left non-dominant side: Secondary | ICD-10-CM | POA: Diagnosis not present

## 2016-05-13 DIAGNOSIS — I10 Essential (primary) hypertension: Secondary | ICD-10-CM | POA: Diagnosis not present

## 2016-05-16 ENCOUNTER — Encounter: Payer: Self-pay | Admitting: Neurology

## 2016-05-16 ENCOUNTER — Ambulatory Visit (INDEPENDENT_AMBULATORY_CARE_PROVIDER_SITE_OTHER): Payer: Medicare Other | Admitting: Neurology

## 2016-05-16 VITALS — BP 138/72 | HR 80 | Resp 16 | Ht 70.0 in | Wt 256.0 lb

## 2016-05-16 DIAGNOSIS — I10 Essential (primary) hypertension: Secondary | ICD-10-CM | POA: Diagnosis not present

## 2016-05-16 DIAGNOSIS — G4733 Obstructive sleep apnea (adult) (pediatric): Secondary | ICD-10-CM

## 2016-05-16 DIAGNOSIS — I639 Cerebral infarction, unspecified: Secondary | ICD-10-CM | POA: Diagnosis not present

## 2016-05-16 DIAGNOSIS — I69318 Other symptoms and signs involving cognitive functions following cerebral infarction: Secondary | ICD-10-CM | POA: Diagnosis not present

## 2016-05-16 DIAGNOSIS — M47812 Spondylosis without myelopathy or radiculopathy, cervical region: Secondary | ICD-10-CM | POA: Diagnosis not present

## 2016-05-16 DIAGNOSIS — I6350 Cerebral infarction due to unspecified occlusion or stenosis of unspecified cerebral artery: Secondary | ICD-10-CM

## 2016-05-16 DIAGNOSIS — I69354 Hemiplegia and hemiparesis following cerebral infarction affecting left non-dominant side: Secondary | ICD-10-CM | POA: Diagnosis not present

## 2016-05-16 DIAGNOSIS — G20 Parkinson's disease: Secondary | ICD-10-CM

## 2016-05-16 DIAGNOSIS — Z9989 Dependence on other enabling machines and devices: Secondary | ICD-10-CM | POA: Diagnosis not present

## 2016-05-16 MED ORDER — CARBIDOPA-LEVODOPA 25-100 MG PO TABS: 2.0000 | ORAL_TABLET | Freq: Four times a day (QID) | ORAL | 5 refills | Status: DC

## 2016-05-16 NOTE — Patient Instructions (Signed)
We will increase her Sinemet to 2 pills 4 times a day.  We will increase your CPAP pressure to 12 cm. I will change from AutoPap.  Keep working with physical therapy, occupational therapy and speech therapy.  We will keep you off the trazodone for now.  Let me know which antidepressant you are on at this time.  Continue exercising regularly and take your medications as directed. As discussed, secondary prevention is key after a stroke. This means: taking care of blood sugar values or diabetes management, good blood pressure (hypertension) control and optimizing cholesterol management, exercising daily or regularly within your own mobility limitations of course, and overall cardiovascular risk factor reduction, which includes screening for and treatment of obstructive sleep apnea (OSA) and weight management.

## 2016-05-16 NOTE — Progress Notes (Signed)
Subjective:    Patient ID: James Schultz is a 65 y.o. male.  HPI     Interim history:   James Schultz is a 65 year old right-handed gentleman with an underlying medical history of Parkinson's disease diagnosed in 2007, depression, anxiety, lumbar spinal stenosis, cervical degenerative disc disease, kidney stone, osteoarthritis, s/p L TKA and s/p R hip surgery, who presents for followup consultation of his PD and obstructive sleep apnea and recent history of stroke. He is accompanied by his wife again today. I last saw him on 01/30/2015, at which time he reported left knee pain and back pain. He was taking Percocet and was seeing neurosurgery for back pain, s/p low back surgery years ago. He had left total knee replacement surgery under Dr. Maureen Ralphs in July 2014 and, in August 2014 he required hip surgery secondary to hip fracture. His wife reported that his memory was worse. He had a fall at church about 2 weeks prior. Unfortunately, he missed a step or misjudged it. He had seen Dr. Nicki Reaper at [facility] in March 2016 and a Sinemet CR 25-100 milligrams strength was added for his fourth dose. He was taking Sinemet 3 times a day, at 6, 10, and 2 PM. He was taking the CR around 6 PM. He does not feel it has made much of a difference. Some time ago, he talked with Dr. Nicki Reaper about DBS surgery and was advised against it at the time. We discussed DBS evaluation again. I asked him to take Sinemet 4 times a day and Sinemet CR 50-200 milligrams strength around 9 PM.  Today, 05/16/2016. I reviewed his AutoPap compliance data from 05/02/2016 through 05/08/2016 which is only 7 days during which time he used his machine 7 days but percent used days greater than 4 hours was 29% only. Residual AHI 18.2 per hour, pressure settings of 7 cm to 18 cm. We do have 90 day compliance data available from 02/02/2016 through 05/01/2016, during which time his compliance was 32%, residual AHI of 14.4, this was on CPAP of 9 cm. He reports  not doing too well, has trouble maintaining sleep at night, goes to bed between 7:30 and 8:30 often per wife, is up in the early morning hours. Mobility has declined, especially since his stroke. His weakness is not as bad on the left side and the longer. Nevertheless, his mobility is also affected by arthritis, his left hip bothers him. He will need an injection into his left hip but may need replacement surgery but is currently not a surgical candidate. In the interim, he was admitted to the hospital on 04/08/2016 secondary to new left facial droop noted on 04/08/2016. He presented to the emergency room. He was diagnosed with right pontine infarct, he had a head CT without contrast on 04/08/2016 which showed generalized atrophy, no acute infarct, but brain MRI from 04/09/2016 confirmed acute right pontine infarct. Brain MRA from 04/09/2016 showed no large vessel occlusion or high-grade stenosis. He was transferred to inpatient rehabilitation on 04/11/2016. He was discharged from rehabilitation on 04/20/2016. He is outpt PT, OT and ST. C/L is 2 pills in AM and 1 1/2 pills for the other 3 doses. He was on trazodone but this has run out. He was taken off of Lexapro in the hospital but is now on a different antidepressant. He is on Plavix generic, Lipitor generic and blood pressure medication.   Previously:  I saw him on 09/17/14, at which time he reported feeling well with CPAP, but had  sleep disturbance due to nocturia and urinary incontinence at night. He did endorse sleeping better with CPAP. His Parkinson's for the most part was stable but he had more freezing and more tremors. I did not change his medications for Parkinson's disease. I asked him to continue using CPAP regularly.   I reviewed his CPAP compliance data from 12/24/2014 through 01/22/2015 which is a total of 30 days during which time he used his machine 28 days with percent used days greater than 4 hours at 70%, indicating adequate compliance  with an average usage for all days of 5 hours and 3 minutes, residual AHI at 4 per hour, leak acceptable with the 95th percentile at 11.5 L/m on a pressure of 9 cm with EPR of 3.   I saw him on 05/02/2014, at which time he reported that he initially did not know how to use the CPAP machine but after he changed to another DME provider and received another machine, he was using it well. He felt improved with his sleep. He was compliant with treatment. He reported chronic back pain and right hip pain. He also reported foot pain and some depressive symptoms. He was tried on Wellbutrin in the past but felt worse.     He used to see Dr. Jim Like for his Parkinson's disease and was last seen by him on 01/03/2014, at which time he was taking Sinemet 4 times a day, starting at 7 AM every 4 hours. He was also on Mirapex 1 mg strength one pill 3 times a day and Azilect 1 mg once daily. He had mild wearing off symptoms of motor fluctuations. He had developed kidney stones which were treated in April 2015. He was first seen by Dr. Janann Colonel and January 2015 and prior to that he used to see Dr. Morene Antu and was last seen by him in March 2013 at which time they discussed potential DBS treatment. He was instructed to follow-up with Dr. Nicki Reaper at [facility] for further evaluation. He had participated in a Parkinson's disease study at [facility] for 5 years which she finished in 2014.    I reviewed his compliance data from 08/16/2014 through 09/14/2014 which is a total of 30 days during which time he used his CPAP every day except for 2 days with percent used days greater than 4 hours of only 50%, indicating suboptimal compliance with an average usage of 3 hours and 41 minutes, residual AHI acceptable at 4.9 per hour and leak acceptable with the 95th percentile at 12.5 L/m. Pressure at 9 cm with EPR of 3.   I first met him on 01/20/2014 at the request of Dr. Janann Colonel, at which time the patient reported that he was diagnosed with severe  obstructive sleep apnea some 10 years ago. He reported being on CPAP but having difficulty tolerating CPAP. He reported cognitive decline as well as daytime somnolence and admitted to not be fully compliant with CPAP therapy. He reported panic attacks when he first put the mask on. He admitted not to have used CPAP in over a year. I suggested he return for a sleep study. 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 He was taking Benadryl 50 mg each night. He had been doing this for years. He had a split-night sleep study on 01/24/2014 and went over his test results with him in detail today. Baseline sleep efficiency was 92.9% with a latency to sleep of 2.5 minutes and wake after sleep onset of 4 minutes with no  significant fragmentation noted. He had a normal arousal index. He had an increased percentage of stage II sleep, absence of deep sleep and absence of REM sleep. He had PACs and PVCs on EKG and no EEG changes. He had no significant PLMS. His total AHI was highly elevated at 45.2 events per hour. Baseline oxygen saturation was 93% with a nadir of 88%. He was titrated on CPAP during the rest of the test. Sleep efficiency was 90.6%. Arousal index was normal. He had an increased percentage of stage II sleep, absence of deep sleep and a normal percentage of REM sleep. Average oxygen saturation was 93% with a nadir of 88%. He had no significant periodic leg movements. He was titrated from 5-8 cm. He had some central apneas on the final pressure of 8 cm. Based on the overall test results I prescribed CPAP at a pressure of 8 cm using large nasal pillows.   I reviewed the patient's CPAP compliance data from 03/19/2014 to 04/17/2014, which is a total of 30 days, during which time the patient used CPAP every day. The average usage for all days was 6 hours and 24 minutes. The percent used days greater than 4 hours was 90 %, indicating excellent compliance. The residual AHI was 6 per hour, indicating a slightly  suboptimal treatment pressure of 8 cwp with EPR of 3. Air leak from the mask was low at 7 L per minute at the 95th percentile. He may need an increase in his pressure, I felt.   His Past Medical History Is Significant For: Past Medical History:  Diagnosis Date  . Anxiety   . Cervical spondylosis   . Degenerative disc disease, cervical   . Depression   . Headache(784.0)   . Hypertension   . OA (osteoarthritis)   . OSA (obstructive sleep apnea)    SEVERE OSA PER STUDY 2005--  USES NASAL CANNULA  WITH SETTING AT 12 -- NO MASK  . Parkinson's disease ([place]) NEUROLOGIST--  DR Jim Like   IN A STUDY AT [facility]--- lov note crae everywhere 02-19-2013 dr Kalman Shan scott  . Right ureteral stone   . Spinal stenosis of lumbar region   . Urinary incontinence    SECONDARY TO PARKINSON'S DISEASE  . Wears glasses     His Past Surgical History Is Significant For: Past Surgical History:  Procedure Laterality Date  . CHOLECYSTECTOMY  2000  . CYSTOSCOPY WITH RETROGRADE PYELOGRAM, URETEROSCOPY AND STENT PLACEMENT Right 11/13/2013   Procedure: CYSTOSCOPY WITH RIGHT URETEROSCOPY, Right Retrograde Pyelogram, RIGHT URETERAL STENT PLACEMENT, Basket Stone Extraction;  Surgeon: Ardis Hughs, MD;  [facility];  Service: Urology;  Laterality: Right;  . HIP PINNING,CANNULATED Right 04/13/2013   Procedure: CANNULATED HIP PINNING;  Surgeon: Johnn Hai, MD;  Location: [facility];  Service: Orthopedics;  Laterality: Right;  PERCUTANEOUS CANNULATED RIGHT HIP PINNING   . HOLMIUM LASER APPLICATION Right 0/12/3974   Procedure: RIGHT LASER LITHOTRIPSY;  Surgeon: Ardis Hughs, MD;  Location: [facility];  Service: Urology;  Laterality: Right;  . KNEE ARTHROSCOPY W/ DEBRIDEMENT Bilateral RIGHT  04-07-2008/   LEFT 10-13-2005  . LUMBAR LAMINECTOMY  01-05-2010   L2  --  L5  . TOTAL KNEE ARTHROPLASTY Left 03/04/2013   Procedure: LEFT TOTAL KNEE ARTHROPLASTY;  Surgeon: Gearlean Alf, MD;  Location: [facility];  Service: Orthopedics;  Laterality: Left;    His Family History Is Significant For: Family History  Problem Relation Age of Onset  .  Cancer Mother   . Parkinson's disease      H/O  . Alzheimer's disease      H/O  . ALS      H/O    His Social History Is Significant For: Social History   Social History  . Marital status: Married    Spouse name: [place]  . Number of children: 1  . Years of education: Bachelor's   Occupational History  . RETIRED Not Employed   Social History Main Topics  . Smoking status: Never Smoker  . Smokeless tobacco: Never Used  . Alcohol use No  . Drug use: No  . Sexual activity: Not Asked   Other Topics Concern  . None   Social History Narrative   Patient is married and lives at home with his wife ([place]), has 1 child   Patient is right handed   Education level is Bachelor's   Caffeine consumption is 3 cups daily    His Allergies Are:  No Known Allergies:   His Current Medications Are:  Outpatient Encounter Prescriptions as of 05/16/2016  Medication Sig  . atorvastatin (LIPITOR) 20 MG tablet Take 1 tablet (20 mg total) by mouth daily at 6 PM.  . carbidopa-levodopa (SINEMET CR) 50-200 MG per tablet Take 1 tablet by mouth at bedtime.  . carbidopa-levodopa (SINEMET IR) 25-100 MG tablet Take 2 tablets by mouth 4 (four) times daily. Take at 6 am, 10 am, 2 pm and 6 pm  . clopidogrel (PLAVIX) 75 MG tablet Take 1 tablet (75 mg total) by mouth daily.  Marland Kitchen lisinopril (PRINIVIL,ZESTRIL) 10 MG tablet Take 1 tablet (10 mg total) by mouth daily.  Marland Kitchen oxyCODONE-acetaminophen (PERCOCET/ROXICET) 5-325 MG tablet Take 1 tablet by mouth every 6 (six) hours as needed for severe pain.  . pramipexole (MIRAPEX) 1 MG tablet Take 0.5 tablets (0.5 mg total) by mouth 3 (three) times daily.  . rasagiline (AZILECT) 1 MG TABS tablet Take 1 tablet (1 mg total) by mouth every morning.  . tamsulosin (FLOMAX) 0.4 MG CAPS capsule Take 1  capsule (0.4 mg total) by mouth daily.  . traZODone (DESYREL) 50 MG tablet Take 1 tablet (50 mg total) by mouth at bedtime.  . [DISCONTINUED] carbidopa-levodopa (SINEMET IR) 25-100 MG tablet Take 1.5-2 tablets by mouth See admin instructions. Take 2 tablets at 6 am then take 1 and 1/2 tablets at 10 am, 2 pm and 6 pm   No facility-administered encounter medications on file as of 05/16/2016.   :  Review of Systems:  Out of a complete 14 point review of systems, all are reviewed and negative with the exception of these symptoms as listed below:  Review of Systems  Neurological:       Patient had recent TIA in August.  F/U for OSA. Patient just received new supplies from [facility]. Wife also had a message that they changed patient's PAP to Auto, pressure 7-18 cm. They did not mention which doctor prescribed this.     Objective:  Neurologic Exam  Physical Exam Physical Examination:   Vitals:   05/16/16 1155  BP: 138/72  Pulse: 80  Resp: 16   General Examination: The patient is a very pleasant 65 y.o. male in no acute distress. He is mildly depressed appearing, tearful at times.  HEENT: Normocephalic, atraumatic, pupils are equal, round and reactive to light and accommodation. Funduscopic exam is normal with sharp disc margins noted. Extraocular tracking shows moderate saccadic breakdown without nystagmus noted. There is limitation to upper gaze. There  is moderate decrease in eye blink rate. Hearing is intact. Face is symmetric with mild facial masking and normal facial sensation. There is no lip, neck or jaw tremor. Neck is mildly rigid with intact passive ROM. There are no carotid bruits on auscultation. Oropharynx exam reveals moderate mouth dryness. Airway is crowded. Uvula is large, tongue is small in size, tonsils are small, right-sided a little bigger than left. Mallampati is class III. Tongue protrudes centrally and palate elevates symmetrically. There is no drooling.   Chest: is clear to  auscultation without wheezing, rhonchi or crackles noted.  Heart: sounds are regular and normal without murmurs, rubs or gallops noted.   Abdomen: is soft, non-tender and non-distended with normal bowel sounds appreciated on auscultation.  Extremities: There is trace edema in the distal lower extremities bilaterally. Pedal pulses are intact. There are no varicose veins.  Skin: is warm and dry with no trophic changes noted. Age-related changes are noted on the skin.   Musculoskeletal: exam reveals no obvious joint deformities, tenderness, joint swelling or erythema.  Neurologically:  Mental status: The patient is awake and alert, paying good attention. He is able to only partially provide the history. His wife provides most of the Hx. He is oriented to: person, place, time/date, situation, day of week, month of year and year. His memory, attention, language and knowledge are fairly well preserved.  On 01/30/2015: MMSE: 28/30, CDT: 3/4, AFT: 11/min.   There is no aphasia, agnosia, apraxia or anomia. There is a mild degree of bradyphrenia. Speech is mildly to moderately hypophonic with no dysarthria noted. Mood is congruent and affect is normal.  Cranial nerves are as described above under HEENT exam. In addition, shoulder shrug is normal with equal shoulder height noted.  Motor exam: Normal bulk, and strength for age is noted, except minimal L sided weakness. There are no dyskinesias noted. Tone is mildly rigid with presence of cogwheeling in the right upper extremity. There is overall moderate to severe bradykinesia. There is no drift or rebound.  There is an intermittent mild to moderate resting tremor, RUE more than LUE. Romberg is not testable safely.  Reflexes are 1+.  Fine motor skills exam: findings motor skills are moderately impaired, worse than last time and worse on the right.  Cerebellar testing shows no dysmetria or intention tremor on finger to nose testing.   Sensory exam is  intact to light touch.    Gait, station and balance: He stands up from the seated position with significant difficulty and does require some help. His posture is moderately stooped. Stance is wide based. He has difficulty with turns. He walks slowly with a 2 wheeled walker. Tandem walk is not possible. Balance is impaired.   Assessment and Plan:   In summary, James Schultz is a very pleasant 65 year old right-handed gentleman with an underlying medical history of Parkinson's disease diagnosed in 2007, depression, anxiety, lumbar spinal stenosis, cervical degenerative disc disease, kidney stone, osteoarthritis, s/p L TKA and s/p R hip surgery, and obesity, who presents for followup consultation of his obstructive sleep apnea and right-sided predominant Parkinson's disease. Generally speaking, he has been compliant with CPAP therapy. For the past 2 weeks or so he has been on AutoPap therapy. I will change his CPAP pressure to 12 cm. He has progressed in his Parkinson's signs and symptoms. I did suggest we increase his Sinemet to 2 pills 4 times a day. We will keep the Azilect and Mirapex the same and the wife  is advised to call us regarding which antidepressant he is on currently. I suggested we no longer utilize trazodone at this time. He is advised to not go to bed too early as he will be up in the early morning hours. Mirapex is currently 0.5 mg 3 times a day. He is an outpatient physical, occupational and speech therapy after his right pontine stroke. We talked about secondary stroke prevention as well. He is advised to try to be fully compliant with CPAP therapy and continue with clopidogrel, his blood pressure medication and his cholesterol medication. I would like to see him back in 4 months, sooner if needed. I encouraged him to call for any interim questions or concerns. I answered all her questions today and they were in agreement. I spent 30 min in total face-to-face time with the patient, more  50% of which was spent in counseling and coordination of care, reviewing test results, reviewing medication and reviewing the diagnosis of PD, stroke and OSA, the prognosis and treatment options.

## 2016-05-17 ENCOUNTER — Telehealth: Payer: Self-pay | Admitting: Neurology

## 2016-05-17 ENCOUNTER — Other Ambulatory Visit: Payer: Self-pay | Admitting: Physical Medicine & Rehabilitation

## 2016-05-17 DIAGNOSIS — G4733 Obstructive sleep apnea (adult) (pediatric): Secondary | ICD-10-CM | POA: Diagnosis not present

## 2016-05-17 DIAGNOSIS — G20 Parkinson's disease: Secondary | ICD-10-CM

## 2016-05-17 DIAGNOSIS — I69318 Other symptoms and signs involving cognitive functions following cerebral infarction: Secondary | ICD-10-CM | POA: Diagnosis not present

## 2016-05-17 DIAGNOSIS — I69354 Hemiplegia and hemiparesis following cerebral infarction affecting left non-dominant side: Secondary | ICD-10-CM | POA: Diagnosis not present

## 2016-05-17 DIAGNOSIS — I10 Essential (primary) hypertension: Secondary | ICD-10-CM | POA: Diagnosis not present

## 2016-05-17 DIAGNOSIS — M47812 Spondylosis without myelopathy or radiculopathy, cervical region: Secondary | ICD-10-CM | POA: Diagnosis not present

## 2016-05-17 MED ORDER — CARBIDOPA-LEVODOPA 25-100 MG PO TABS: 2.0000 | ORAL_TABLET | Freq: Four times a day (QID) | ORAL | 5 refills | Status: AC

## 2016-05-17 NOTE — Telephone Encounter (Signed)
Rx was sent in yesterday. I will resend.

## 2016-05-17 NOTE — Telephone Encounter (Signed)
No change in Mirapex.

## 2016-05-17 NOTE — Telephone Encounter (Signed)
Pt's wife called said Dr A was going to change directions and qty on pramipexole (MIRAPEX) 1 MG tablet . Please send to [facility]. She is wanting to pick up RX today

## 2016-05-17 NOTE — Telephone Encounter (Signed)
Pt's wife called back-sts it is carbidopa-levodopa (SINEMET IR) 25-100 MG tablet not mirapex. She is needing 30 day supply to [facility]. She is wanting to pick it up tomorrow

## 2016-05-17 NOTE — Telephone Encounter (Signed)
I dont see any indication for change, just refills perhaps?

## 2016-05-17 NOTE — Addendum Note (Signed)
Addended by: Laurence Spates on: 05/17/2016 04:17 PM   Modules accepted: Orders

## 2016-05-17 NOTE — Telephone Encounter (Signed)
LM for wife to call back. There was no changes made on Mirapex yesterday. Looks like he needs refill and want to know if she wants 30 day or 90 day Rx?

## 2016-05-18 DIAGNOSIS — G4733 Obstructive sleep apnea (adult) (pediatric): Secondary | ICD-10-CM | POA: Diagnosis not present

## 2016-05-18 DIAGNOSIS — M47812 Spondylosis without myelopathy or radiculopathy, cervical region: Secondary | ICD-10-CM | POA: Diagnosis not present

## 2016-05-18 DIAGNOSIS — I69354 Hemiplegia and hemiparesis following cerebral infarction affecting left non-dominant side: Secondary | ICD-10-CM | POA: Diagnosis not present

## 2016-05-18 DIAGNOSIS — I10 Essential (primary) hypertension: Secondary | ICD-10-CM | POA: Diagnosis not present

## 2016-05-18 DIAGNOSIS — G20 Parkinson's disease: Secondary | ICD-10-CM | POA: Diagnosis not present

## 2016-05-18 DIAGNOSIS — I69318 Other symptoms and signs involving cognitive functions following cerebral infarction: Secondary | ICD-10-CM | POA: Diagnosis not present

## 2016-05-18 NOTE — Telephone Encounter (Signed)
Pt's wife called stating she is at the pharmacy and they have not received the rx for carbidopa-levodopa (SINEMET IR) 25-100 MG tablet. Please call and advise

## 2016-05-18 NOTE — Telephone Encounter (Signed)
Pharmacy called back and I gave verbal order to [place].

## 2016-05-19 DIAGNOSIS — I10 Essential (primary) hypertension: Secondary | ICD-10-CM | POA: Diagnosis not present

## 2016-05-19 DIAGNOSIS — G20 Parkinson's disease: Secondary | ICD-10-CM | POA: Diagnosis not present

## 2016-05-19 DIAGNOSIS — I69318 Other symptoms and signs involving cognitive functions following cerebral infarction: Secondary | ICD-10-CM | POA: Diagnosis not present

## 2016-05-19 DIAGNOSIS — I69354 Hemiplegia and hemiparesis following cerebral infarction affecting left non-dominant side: Secondary | ICD-10-CM | POA: Diagnosis not present

## 2016-05-19 DIAGNOSIS — G4733 Obstructive sleep apnea (adult) (pediatric): Secondary | ICD-10-CM | POA: Diagnosis not present

## 2016-05-19 DIAGNOSIS — M47812 Spondylosis without myelopathy or radiculopathy, cervical region: Secondary | ICD-10-CM | POA: Diagnosis not present

## 2016-05-20 DIAGNOSIS — G4733 Obstructive sleep apnea (adult) (pediatric): Secondary | ICD-10-CM | POA: Diagnosis not present

## 2016-05-20 DIAGNOSIS — I69354 Hemiplegia and hemiparesis following cerebral infarction affecting left non-dominant side: Secondary | ICD-10-CM | POA: Diagnosis not present

## 2016-05-20 DIAGNOSIS — I69318 Other symptoms and signs involving cognitive functions following cerebral infarction: Secondary | ICD-10-CM | POA: Diagnosis not present

## 2016-05-20 DIAGNOSIS — M47812 Spondylosis without myelopathy or radiculopathy, cervical region: Secondary | ICD-10-CM | POA: Diagnosis not present

## 2016-05-20 DIAGNOSIS — I10 Essential (primary) hypertension: Secondary | ICD-10-CM | POA: Diagnosis not present

## 2016-05-20 DIAGNOSIS — G20 Parkinson's disease: Secondary | ICD-10-CM | POA: Diagnosis not present

## 2016-05-24 DIAGNOSIS — G20 Parkinson's disease: Secondary | ICD-10-CM | POA: Diagnosis not present

## 2016-05-24 DIAGNOSIS — M47812 Spondylosis without myelopathy or radiculopathy, cervical region: Secondary | ICD-10-CM | POA: Diagnosis not present

## 2016-05-24 DIAGNOSIS — I69354 Hemiplegia and hemiparesis following cerebral infarction affecting left non-dominant side: Secondary | ICD-10-CM | POA: Diagnosis not present

## 2016-05-24 DIAGNOSIS — I10 Essential (primary) hypertension: Secondary | ICD-10-CM | POA: Diagnosis not present

## 2016-05-24 DIAGNOSIS — I69318 Other symptoms and signs involving cognitive functions following cerebral infarction: Secondary | ICD-10-CM | POA: Diagnosis not present

## 2016-05-24 DIAGNOSIS — G4733 Obstructive sleep apnea (adult) (pediatric): Secondary | ICD-10-CM | POA: Diagnosis not present

## 2016-05-25 DIAGNOSIS — M1612 Unilateral primary osteoarthritis, left hip: Secondary | ICD-10-CM | POA: Diagnosis not present

## 2016-05-26 DIAGNOSIS — Z789 Other specified health status: Secondary | ICD-10-CM | POA: Diagnosis not present

## 2016-05-26 DIAGNOSIS — G20 Parkinson's disease: Secondary | ICD-10-CM | POA: Diagnosis not present

## 2016-05-26 DIAGNOSIS — Z299 Encounter for prophylactic measures, unspecified: Secondary | ICD-10-CM | POA: Diagnosis not present

## 2016-05-26 DIAGNOSIS — Z6835 Body mass index (BMI) 35.0-35.9, adult: Secondary | ICD-10-CM | POA: Diagnosis not present

## 2016-05-26 DIAGNOSIS — I6350 Cerebral infarction due to unspecified occlusion or stenosis of unspecified cerebral artery: Secondary | ICD-10-CM | POA: Diagnosis not present

## 2016-06-02 DIAGNOSIS — F0280 Dementia in other diseases classified elsewhere without behavioral disturbance: Secondary | ICD-10-CM | POA: Diagnosis not present

## 2016-06-02 DIAGNOSIS — Z299 Encounter for prophylactic measures, unspecified: Secondary | ICD-10-CM | POA: Diagnosis not present

## 2016-06-02 DIAGNOSIS — G20 Parkinson's disease: Secondary | ICD-10-CM | POA: Diagnosis not present

## 2016-06-02 DIAGNOSIS — I10 Essential (primary) hypertension: Secondary | ICD-10-CM | POA: Diagnosis not present

## 2016-06-02 DIAGNOSIS — G3183 Dementia with Lewy bodies: Secondary | ICD-10-CM | POA: Diagnosis not present

## 2016-06-03 DIAGNOSIS — F0280 Dementia in other diseases classified elsewhere without behavioral disturbance: Secondary | ICD-10-CM | POA: Diagnosis not present

## 2016-06-03 DIAGNOSIS — R0602 Shortness of breath: Secondary | ICD-10-CM | POA: Diagnosis not present

## 2016-06-03 DIAGNOSIS — Z8249 Family history of ischemic heart disease and other diseases of the circulatory system: Secondary | ICD-10-CM | POA: Diagnosis not present

## 2016-06-03 DIAGNOSIS — R531 Weakness: Secondary | ICD-10-CM | POA: Diagnosis not present

## 2016-06-03 DIAGNOSIS — G3183 Dementia with Lewy bodies: Secondary | ICD-10-CM | POA: Diagnosis not present

## 2016-06-03 DIAGNOSIS — R404 Transient alteration of awareness: Secondary | ICD-10-CM | POA: Diagnosis not present

## 2016-06-03 DIAGNOSIS — K5900 Constipation, unspecified: Secondary | ICD-10-CM | POA: Diagnosis not present

## 2016-06-03 DIAGNOSIS — Z8673 Personal history of transient ischemic attack (TIA), and cerebral infarction without residual deficits: Secondary | ICD-10-CM | POA: Diagnosis not present

## 2016-06-03 DIAGNOSIS — R079 Chest pain, unspecified: Secondary | ICD-10-CM | POA: Diagnosis not present

## 2016-06-03 DIAGNOSIS — I10 Essential (primary) hypertension: Secondary | ICD-10-CM | POA: Diagnosis not present

## 2016-06-04 DIAGNOSIS — R531 Weakness: Secondary | ICD-10-CM | POA: Diagnosis not present

## 2016-06-04 DIAGNOSIS — F0280 Dementia in other diseases classified elsewhere without behavioral disturbance: Secondary | ICD-10-CM | POA: Diagnosis not present

## 2016-06-04 DIAGNOSIS — G3183 Dementia with Lewy bodies: Secondary | ICD-10-CM | POA: Diagnosis not present

## 2016-06-04 DIAGNOSIS — R918 Other nonspecific abnormal finding of lung field: Secondary | ICD-10-CM | POA: Diagnosis not present

## 2016-06-04 DIAGNOSIS — R079 Chest pain, unspecified: Secondary | ICD-10-CM | POA: Diagnosis not present

## 2016-06-05 DIAGNOSIS — G3183 Dementia with Lewy bodies: Secondary | ICD-10-CM | POA: Diagnosis not present

## 2016-06-05 DIAGNOSIS — R531 Weakness: Secondary | ICD-10-CM | POA: Diagnosis not present

## 2016-06-05 DIAGNOSIS — F0280 Dementia in other diseases classified elsewhere without behavioral disturbance: Secondary | ICD-10-CM | POA: Diagnosis not present

## 2016-06-05 DIAGNOSIS — R079 Chest pain, unspecified: Secondary | ICD-10-CM | POA: Diagnosis not present

## 2016-06-06 DIAGNOSIS — F0280 Dementia in other diseases classified elsewhere without behavioral disturbance: Secondary | ICD-10-CM | POA: Diagnosis present

## 2016-06-06 DIAGNOSIS — R1313 Dysphagia, pharyngeal phase: Secondary | ICD-10-CM | POA: Diagnosis not present

## 2016-06-06 DIAGNOSIS — Z8673 Personal history of transient ischemic attack (TIA), and cerebral infarction without residual deficits: Secondary | ICD-10-CM | POA: Diagnosis not present

## 2016-06-06 DIAGNOSIS — R4182 Altered mental status, unspecified: Secondary | ICD-10-CM | POA: Diagnosis not present

## 2016-06-06 DIAGNOSIS — R531 Weakness: Secondary | ICD-10-CM | POA: Diagnosis not present

## 2016-06-06 DIAGNOSIS — G3183 Dementia with Lewy bodies: Secondary | ICD-10-CM | POA: Diagnosis present

## 2016-06-06 DIAGNOSIS — N400 Enlarged prostate without lower urinary tract symptoms: Secondary | ICD-10-CM | POA: Diagnosis not present

## 2016-06-06 DIAGNOSIS — Z23 Encounter for immunization: Secondary | ICD-10-CM | POA: Diagnosis not present

## 2016-06-06 DIAGNOSIS — G20 Parkinson's disease: Secondary | ICD-10-CM | POA: Diagnosis not present

## 2016-06-06 DIAGNOSIS — M6281 Muscle weakness (generalized): Secondary | ICD-10-CM | POA: Diagnosis not present

## 2016-06-06 DIAGNOSIS — I10 Essential (primary) hypertension: Secondary | ICD-10-CM | POA: Diagnosis present

## 2016-06-06 DIAGNOSIS — Z8249 Family history of ischemic heart disease and other diseases of the circulatory system: Secondary | ICD-10-CM | POA: Diagnosis not present

## 2016-06-06 DIAGNOSIS — R2689 Other abnormalities of gait and mobility: Secondary | ICD-10-CM | POA: Diagnosis not present

## 2016-06-06 DIAGNOSIS — F329 Major depressive disorder, single episode, unspecified: Secondary | ICD-10-CM | POA: Diagnosis not present

## 2016-06-06 DIAGNOSIS — R278 Other lack of coordination: Secondary | ICD-10-CM | POA: Diagnosis not present

## 2016-06-06 DIAGNOSIS — R079 Chest pain, unspecified: Secondary | ICD-10-CM | POA: Diagnosis present

## 2016-06-06 DIAGNOSIS — Z818 Family history of other mental and behavioral disorders: Secondary | ICD-10-CM | POA: Diagnosis not present

## 2016-06-06 DIAGNOSIS — F419 Anxiety disorder, unspecified: Secondary | ICD-10-CM | POA: Diagnosis not present

## 2016-06-09 DIAGNOSIS — H9202 Otalgia, left ear: Secondary | ICD-10-CM | POA: Diagnosis not present

## 2016-06-09 DIAGNOSIS — N400 Enlarged prostate without lower urinary tract symptoms: Secondary | ICD-10-CM | POA: Diagnosis not present

## 2016-06-09 DIAGNOSIS — H6120 Impacted cerumen, unspecified ear: Secondary | ICD-10-CM | POA: Diagnosis not present

## 2016-06-09 DIAGNOSIS — R1312 Dysphagia, oropharyngeal phase: Secondary | ICD-10-CM | POA: Diagnosis not present

## 2016-06-09 DIAGNOSIS — H9190 Unspecified hearing loss, unspecified ear: Secondary | ICD-10-CM | POA: Diagnosis not present

## 2016-06-09 DIAGNOSIS — G20 Parkinson's disease: Secondary | ICD-10-CM | POA: Diagnosis not present

## 2016-06-09 DIAGNOSIS — R2689 Other abnormalities of gait and mobility: Secondary | ICD-10-CM | POA: Diagnosis not present

## 2016-06-09 DIAGNOSIS — F419 Anxiety disorder, unspecified: Secondary | ICD-10-CM | POA: Diagnosis not present

## 2016-06-09 DIAGNOSIS — Z23 Encounter for immunization: Secondary | ICD-10-CM | POA: Diagnosis not present

## 2016-06-09 DIAGNOSIS — I10 Essential (primary) hypertension: Secondary | ICD-10-CM | POA: Diagnosis not present

## 2016-06-09 DIAGNOSIS — M6281 Muscle weakness (generalized): Secondary | ICD-10-CM | POA: Diagnosis not present

## 2016-06-09 DIAGNOSIS — F329 Major depressive disorder, single episode, unspecified: Secondary | ICD-10-CM | POA: Diagnosis not present

## 2016-06-09 DIAGNOSIS — R1313 Dysphagia, pharyngeal phase: Secondary | ICD-10-CM | POA: Diagnosis not present

## 2016-06-09 DIAGNOSIS — G3183 Dementia with Lewy bodies: Secondary | ICD-10-CM | POA: Diagnosis not present

## 2016-06-09 DIAGNOSIS — R278 Other lack of coordination: Secondary | ICD-10-CM | POA: Diagnosis not present

## 2016-06-09 DIAGNOSIS — F0280 Dementia in other diseases classified elsewhere without behavioral disturbance: Secondary | ICD-10-CM | POA: Diagnosis not present

## 2016-06-09 DIAGNOSIS — R079 Chest pain, unspecified: Secondary | ICD-10-CM | POA: Diagnosis not present

## 2016-06-09 DIAGNOSIS — Z8673 Personal history of transient ischemic attack (TIA), and cerebral infarction without residual deficits: Secondary | ICD-10-CM | POA: Diagnosis not present

## 2016-06-09 DIAGNOSIS — R531 Weakness: Secondary | ICD-10-CM | POA: Diagnosis not present

## 2016-06-09 DIAGNOSIS — F0390 Unspecified dementia without behavioral disturbance: Secondary | ICD-10-CM | POA: Diagnosis not present

## 2016-06-13 DIAGNOSIS — G20 Parkinson's disease: Secondary | ICD-10-CM | POA: Diagnosis not present

## 2016-06-13 DIAGNOSIS — R531 Weakness: Secondary | ICD-10-CM | POA: Diagnosis not present

## 2016-06-13 DIAGNOSIS — I10 Essential (primary) hypertension: Secondary | ICD-10-CM | POA: Diagnosis not present

## 2016-06-13 DIAGNOSIS — G3183 Dementia with Lewy bodies: Secondary | ICD-10-CM | POA: Diagnosis not present

## 2016-06-18 DIAGNOSIS — H9190 Unspecified hearing loss, unspecified ear: Secondary | ICD-10-CM | POA: Diagnosis not present

## 2016-06-18 DIAGNOSIS — G20 Parkinson's disease: Secondary | ICD-10-CM | POA: Diagnosis not present

## 2016-06-21 DIAGNOSIS — G3183 Dementia with Lewy bodies: Secondary | ICD-10-CM | POA: Diagnosis not present

## 2016-06-21 DIAGNOSIS — G20 Parkinson's disease: Secondary | ICD-10-CM | POA: Diagnosis not present

## 2016-06-23 DIAGNOSIS — H6120 Impacted cerumen, unspecified ear: Secondary | ICD-10-CM | POA: Diagnosis not present

## 2016-06-23 DIAGNOSIS — H9202 Otalgia, left ear: Secondary | ICD-10-CM | POA: Diagnosis not present

## 2016-06-23 DIAGNOSIS — F0390 Unspecified dementia without behavioral disturbance: Secondary | ICD-10-CM | POA: Diagnosis not present

## 2016-06-24 DIAGNOSIS — R1312 Dysphagia, oropharyngeal phase: Secondary | ICD-10-CM | POA: Diagnosis not present

## 2016-07-27 DIAGNOSIS — F419 Anxiety disorder, unspecified: Secondary | ICD-10-CM | POA: Diagnosis not present

## 2016-07-27 DIAGNOSIS — G3183 Dementia with Lewy bodies: Secondary | ICD-10-CM | POA: Diagnosis not present

## 2016-07-27 DIAGNOSIS — I10 Essential (primary) hypertension: Secondary | ICD-10-CM | POA: Diagnosis not present

## 2016-07-27 DIAGNOSIS — G20 Parkinson's disease: Secondary | ICD-10-CM | POA: Diagnosis not present

## 2016-07-27 DIAGNOSIS — F329 Major depressive disorder, single episode, unspecified: Secondary | ICD-10-CM | POA: Diagnosis not present

## 2016-07-27 DIAGNOSIS — I69354 Hemiplegia and hemiparesis following cerebral infarction affecting left non-dominant side: Secondary | ICD-10-CM | POA: Diagnosis not present

## 2016-07-27 DIAGNOSIS — M545 Low back pain: Secondary | ICD-10-CM | POA: Diagnosis not present

## 2016-07-27 DIAGNOSIS — F0281 Dementia in other diseases classified elsewhere with behavioral disturbance: Secondary | ICD-10-CM | POA: Diagnosis not present

## 2016-07-29 DIAGNOSIS — G20 Parkinson's disease: Secondary | ICD-10-CM | POA: Diagnosis not present

## 2016-07-29 DIAGNOSIS — I10 Essential (primary) hypertension: Secondary | ICD-10-CM | POA: Diagnosis not present

## 2016-07-29 DIAGNOSIS — G3183 Dementia with Lewy bodies: Secondary | ICD-10-CM | POA: Diagnosis not present

## 2016-07-29 DIAGNOSIS — F329 Major depressive disorder, single episode, unspecified: Secondary | ICD-10-CM | POA: Diagnosis not present

## 2016-07-29 DIAGNOSIS — F0281 Dementia in other diseases classified elsewhere with behavioral disturbance: Secondary | ICD-10-CM | POA: Diagnosis not present

## 2016-07-29 DIAGNOSIS — I69354 Hemiplegia and hemiparesis following cerebral infarction affecting left non-dominant side: Secondary | ICD-10-CM | POA: Diagnosis not present

## 2016-08-01 DIAGNOSIS — F0281 Dementia in other diseases classified elsewhere with behavioral disturbance: Secondary | ICD-10-CM | POA: Diagnosis not present

## 2016-08-01 DIAGNOSIS — I10 Essential (primary) hypertension: Secondary | ICD-10-CM | POA: Diagnosis not present

## 2016-08-01 DIAGNOSIS — I69354 Hemiplegia and hemiparesis following cerebral infarction affecting left non-dominant side: Secondary | ICD-10-CM | POA: Diagnosis not present

## 2016-08-01 DIAGNOSIS — F329 Major depressive disorder, single episode, unspecified: Secondary | ICD-10-CM | POA: Diagnosis not present

## 2016-08-01 DIAGNOSIS — G20 Parkinson's disease: Secondary | ICD-10-CM | POA: Diagnosis not present

## 2016-08-01 DIAGNOSIS — G3183 Dementia with Lewy bodies: Secondary | ICD-10-CM | POA: Diagnosis not present

## 2016-08-02 DIAGNOSIS — F329 Major depressive disorder, single episode, unspecified: Secondary | ICD-10-CM | POA: Diagnosis not present

## 2016-08-02 DIAGNOSIS — F321 Major depressive disorder, single episode, moderate: Secondary | ICD-10-CM | POA: Diagnosis not present

## 2016-08-02 DIAGNOSIS — I69354 Hemiplegia and hemiparesis following cerebral infarction affecting left non-dominant side: Secondary | ICD-10-CM | POA: Diagnosis not present

## 2016-08-02 DIAGNOSIS — I10 Essential (primary) hypertension: Secondary | ICD-10-CM | POA: Diagnosis not present

## 2016-08-02 DIAGNOSIS — Z299 Encounter for prophylactic measures, unspecified: Secondary | ICD-10-CM | POA: Diagnosis not present

## 2016-08-02 DIAGNOSIS — G3183 Dementia with Lewy bodies: Secondary | ICD-10-CM | POA: Diagnosis not present

## 2016-08-02 DIAGNOSIS — F0281 Dementia in other diseases classified elsewhere with behavioral disturbance: Secondary | ICD-10-CM | POA: Diagnosis not present

## 2016-08-02 DIAGNOSIS — G20 Parkinson's disease: Secondary | ICD-10-CM | POA: Diagnosis not present

## 2016-08-02 DIAGNOSIS — Z09 Encounter for follow-up examination after completed treatment for conditions other than malignant neoplasm: Secondary | ICD-10-CM | POA: Diagnosis not present

## 2016-08-02 DIAGNOSIS — I6350 Cerebral infarction due to unspecified occlusion or stenosis of unspecified cerebral artery: Secondary | ICD-10-CM | POA: Diagnosis not present

## 2016-08-04 DIAGNOSIS — G3183 Dementia with Lewy bodies: Secondary | ICD-10-CM | POA: Diagnosis not present

## 2016-08-04 DIAGNOSIS — I10 Essential (primary) hypertension: Secondary | ICD-10-CM | POA: Diagnosis not present

## 2016-08-04 DIAGNOSIS — I69354 Hemiplegia and hemiparesis following cerebral infarction affecting left non-dominant side: Secondary | ICD-10-CM | POA: Diagnosis not present

## 2016-08-04 DIAGNOSIS — G20 Parkinson's disease: Secondary | ICD-10-CM | POA: Diagnosis not present

## 2016-08-04 DIAGNOSIS — F0281 Dementia in other diseases classified elsewhere with behavioral disturbance: Secondary | ICD-10-CM | POA: Diagnosis not present

## 2016-08-04 DIAGNOSIS — F329 Major depressive disorder, single episode, unspecified: Secondary | ICD-10-CM | POA: Diagnosis not present

## 2016-08-09 DIAGNOSIS — I10 Essential (primary) hypertension: Secondary | ICD-10-CM | POA: Diagnosis not present

## 2016-08-09 DIAGNOSIS — F329 Major depressive disorder, single episode, unspecified: Secondary | ICD-10-CM | POA: Diagnosis not present

## 2016-08-09 DIAGNOSIS — I69354 Hemiplegia and hemiparesis following cerebral infarction affecting left non-dominant side: Secondary | ICD-10-CM | POA: Diagnosis not present

## 2016-08-09 DIAGNOSIS — G3183 Dementia with Lewy bodies: Secondary | ICD-10-CM | POA: Diagnosis not present

## 2016-08-09 DIAGNOSIS — G20 Parkinson's disease: Secondary | ICD-10-CM | POA: Diagnosis not present

## 2016-08-09 DIAGNOSIS — F0281 Dementia in other diseases classified elsewhere with behavioral disturbance: Secondary | ICD-10-CM | POA: Diagnosis not present

## 2016-08-10 DIAGNOSIS — F329 Major depressive disorder, single episode, unspecified: Secondary | ICD-10-CM | POA: Diagnosis not present

## 2016-08-10 DIAGNOSIS — I69354 Hemiplegia and hemiparesis following cerebral infarction affecting left non-dominant side: Secondary | ICD-10-CM | POA: Diagnosis not present

## 2016-08-10 DIAGNOSIS — I10 Essential (primary) hypertension: Secondary | ICD-10-CM | POA: Diagnosis not present

## 2016-08-10 DIAGNOSIS — G3183 Dementia with Lewy bodies: Secondary | ICD-10-CM | POA: Diagnosis not present

## 2016-08-10 DIAGNOSIS — F0281 Dementia in other diseases classified elsewhere with behavioral disturbance: Secondary | ICD-10-CM | POA: Diagnosis not present

## 2016-08-10 DIAGNOSIS — G20 Parkinson's disease: Secondary | ICD-10-CM | POA: Diagnosis not present

## 2016-08-11 DIAGNOSIS — I69354 Hemiplegia and hemiparesis following cerebral infarction affecting left non-dominant side: Secondary | ICD-10-CM | POA: Diagnosis not present

## 2016-08-11 DIAGNOSIS — F0281 Dementia in other diseases classified elsewhere with behavioral disturbance: Secondary | ICD-10-CM | POA: Diagnosis not present

## 2016-08-11 DIAGNOSIS — G3183 Dementia with Lewy bodies: Secondary | ICD-10-CM | POA: Diagnosis not present

## 2016-08-11 DIAGNOSIS — I10 Essential (primary) hypertension: Secondary | ICD-10-CM | POA: Diagnosis not present

## 2016-08-11 DIAGNOSIS — G20 Parkinson's disease: Secondary | ICD-10-CM | POA: Diagnosis not present

## 2016-08-11 DIAGNOSIS — F329 Major depressive disorder, single episode, unspecified: Secondary | ICD-10-CM | POA: Diagnosis not present

## 2016-08-16 DIAGNOSIS — I69354 Hemiplegia and hemiparesis following cerebral infarction affecting left non-dominant side: Secondary | ICD-10-CM | POA: Diagnosis not present

## 2016-08-16 DIAGNOSIS — F329 Major depressive disorder, single episode, unspecified: Secondary | ICD-10-CM | POA: Diagnosis not present

## 2016-08-16 DIAGNOSIS — G3183 Dementia with Lewy bodies: Secondary | ICD-10-CM | POA: Diagnosis not present

## 2016-08-16 DIAGNOSIS — G20 Parkinson's disease: Secondary | ICD-10-CM | POA: Diagnosis not present

## 2016-08-16 DIAGNOSIS — I10 Essential (primary) hypertension: Secondary | ICD-10-CM | POA: Diagnosis not present

## 2016-08-16 DIAGNOSIS — F0281 Dementia in other diseases classified elsewhere with behavioral disturbance: Secondary | ICD-10-CM | POA: Diagnosis not present

## 2016-08-18 DIAGNOSIS — G20 Parkinson's disease: Secondary | ICD-10-CM | POA: Diagnosis not present

## 2016-08-18 DIAGNOSIS — F329 Major depressive disorder, single episode, unspecified: Secondary | ICD-10-CM | POA: Diagnosis not present

## 2016-08-18 DIAGNOSIS — G3183 Dementia with Lewy bodies: Secondary | ICD-10-CM | POA: Diagnosis not present

## 2016-08-18 DIAGNOSIS — F0281 Dementia in other diseases classified elsewhere with behavioral disturbance: Secondary | ICD-10-CM | POA: Diagnosis not present

## 2016-08-18 DIAGNOSIS — I10 Essential (primary) hypertension: Secondary | ICD-10-CM | POA: Diagnosis not present

## 2016-08-18 DIAGNOSIS — I69354 Hemiplegia and hemiparesis following cerebral infarction affecting left non-dominant side: Secondary | ICD-10-CM | POA: Diagnosis not present

## 2016-08-22 DIAGNOSIS — F0281 Dementia in other diseases classified elsewhere with behavioral disturbance: Secondary | ICD-10-CM | POA: Diagnosis not present

## 2016-08-22 DIAGNOSIS — G20 Parkinson's disease: Secondary | ICD-10-CM | POA: Diagnosis not present

## 2016-08-22 DIAGNOSIS — I10 Essential (primary) hypertension: Secondary | ICD-10-CM | POA: Diagnosis not present

## 2016-08-22 DIAGNOSIS — F329 Major depressive disorder, single episode, unspecified: Secondary | ICD-10-CM | POA: Diagnosis not present

## 2016-08-22 DIAGNOSIS — I69354 Hemiplegia and hemiparesis following cerebral infarction affecting left non-dominant side: Secondary | ICD-10-CM | POA: Diagnosis not present

## 2016-08-22 DIAGNOSIS — G3183 Dementia with Lewy bodies: Secondary | ICD-10-CM | POA: Diagnosis not present

## 2016-08-23 DIAGNOSIS — M47816 Spondylosis without myelopathy or radiculopathy, lumbar region: Secondary | ICD-10-CM | POA: Diagnosis not present

## 2016-08-23 DIAGNOSIS — M4726 Other spondylosis with radiculopathy, lumbar region: Secondary | ICD-10-CM | POA: Diagnosis not present

## 2016-08-24 DIAGNOSIS — I69354 Hemiplegia and hemiparesis following cerebral infarction affecting left non-dominant side: Secondary | ICD-10-CM | POA: Diagnosis not present

## 2016-08-24 DIAGNOSIS — G20 Parkinson's disease: Secondary | ICD-10-CM | POA: Diagnosis not present

## 2016-08-24 DIAGNOSIS — F329 Major depressive disorder, single episode, unspecified: Secondary | ICD-10-CM | POA: Diagnosis not present

## 2016-08-24 DIAGNOSIS — G3183 Dementia with Lewy bodies: Secondary | ICD-10-CM | POA: Diagnosis not present

## 2016-08-24 DIAGNOSIS — I10 Essential (primary) hypertension: Secondary | ICD-10-CM | POA: Diagnosis not present

## 2016-08-24 DIAGNOSIS — F0281 Dementia in other diseases classified elsewhere with behavioral disturbance: Secondary | ICD-10-CM | POA: Diagnosis not present

## 2016-09-02 DIAGNOSIS — R262 Difficulty in walking, not elsewhere classified: Secondary | ICD-10-CM | POA: Diagnosis not present

## 2016-09-02 DIAGNOSIS — M6281 Muscle weakness (generalized): Secondary | ICD-10-CM | POA: Diagnosis not present

## 2016-09-02 DIAGNOSIS — G20 Parkinson's disease: Secondary | ICD-10-CM | POA: Diagnosis not present

## 2016-09-05 DIAGNOSIS — G20 Parkinson's disease: Secondary | ICD-10-CM | POA: Diagnosis not present

## 2016-09-05 DIAGNOSIS — M6281 Muscle weakness (generalized): Secondary | ICD-10-CM | POA: Diagnosis not present

## 2016-09-05 DIAGNOSIS — R262 Difficulty in walking, not elsewhere classified: Secondary | ICD-10-CM | POA: Diagnosis not present

## 2016-09-08 DIAGNOSIS — R262 Difficulty in walking, not elsewhere classified: Secondary | ICD-10-CM | POA: Diagnosis not present

## 2016-09-08 DIAGNOSIS — M6281 Muscle weakness (generalized): Secondary | ICD-10-CM | POA: Diagnosis not present

## 2016-09-08 DIAGNOSIS — G20 Parkinson's disease: Secondary | ICD-10-CM | POA: Diagnosis not present

## 2016-09-14 ENCOUNTER — Encounter: Payer: Self-pay | Admitting: Neurology

## 2016-09-14 DIAGNOSIS — R262 Difficulty in walking, not elsewhere classified: Secondary | ICD-10-CM | POA: Diagnosis not present

## 2016-09-14 DIAGNOSIS — M6281 Muscle weakness (generalized): Secondary | ICD-10-CM | POA: Diagnosis not present

## 2016-09-14 DIAGNOSIS — G20 Parkinson's disease: Secondary | ICD-10-CM | POA: Diagnosis not present

## 2016-09-15 DIAGNOSIS — M6281 Muscle weakness (generalized): Secondary | ICD-10-CM | POA: Diagnosis not present

## 2016-09-15 DIAGNOSIS — R262 Difficulty in walking, not elsewhere classified: Secondary | ICD-10-CM | POA: Diagnosis not present

## 2016-09-15 DIAGNOSIS — Z8673 Personal history of transient ischemic attack (TIA), and cerebral infarction without residual deficits: Secondary | ICD-10-CM | POA: Diagnosis not present

## 2016-09-15 DIAGNOSIS — G20 Parkinson's disease: Secondary | ICD-10-CM | POA: Diagnosis not present

## 2016-09-19 ENCOUNTER — Ambulatory Visit (INDEPENDENT_AMBULATORY_CARE_PROVIDER_SITE_OTHER): Payer: Medicare Other | Admitting: Neurology

## 2016-09-19 ENCOUNTER — Encounter: Payer: Self-pay | Admitting: Neurology

## 2016-09-19 VITALS — BP 100/67 | HR 95 | Resp 20 | Ht 70.0 in | Wt 246.0 lb

## 2016-09-19 DIAGNOSIS — G20 Parkinson's disease: Secondary | ICD-10-CM | POA: Diagnosis not present

## 2016-09-19 DIAGNOSIS — G4733 Obstructive sleep apnea (adult) (pediatric): Secondary | ICD-10-CM

## 2016-09-19 DIAGNOSIS — G458 Other transient cerebral ischemic attacks and related syndromes: Secondary | ICD-10-CM | POA: Diagnosis not present

## 2016-09-19 DIAGNOSIS — I6350 Cerebral infarction due to unspecified occlusion or stenosis of unspecified cerebral artery: Secondary | ICD-10-CM | POA: Diagnosis not present

## 2016-09-19 DIAGNOSIS — Z9989 Dependence on other enabling machines and devices: Secondary | ICD-10-CM

## 2016-09-19 NOTE — Patient Instructions (Signed)
Please call [facility] for issues with your CPAP machine: [phone], ext [phone].  From my end of things, you are doing well with your CPAP, air leak from the pillows just a little higher, change the insert, the leak may improve.  Continue meds for PD, take zoloft at night and Azilect in the day.  Keep appt with Dr. Nicki Reaper.  We will see you in 4 months.

## 2016-09-19 NOTE — Progress Notes (Signed)
Subjective:    Patient ID: James Schultz is a 66 y.o. male.  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 He is accompanied by his wife again today. I last saw him on 05/16/2016, at which time he was noncompliant with his AutoPap machine. We also reviewed his hospitalizations. He was admitted to the hospital on 04/08/2016 secondary to new left facial droop noted on 04/08/2016. He presented to the emergency room. He was diagnosed with right pontine infarct, he had a head CT without contrast on 04/08/2016 which showed generalized atrophy, no acute infarct, but brain MRI from 04/09/2016 confirmed acute right pontine infarct. Brain MRA from 04/09/2016 showed no large vessel occlusion or high-grade stenosis. He was transferred to inpatient rehabilitation on 04/11/2016. He was discharged from rehabilitation on 04/20/2016. He was then in outpatient PT, OT and speech therapy. He was taken off of Lexapro and started a different antidepressant. He was on Plavix, Lipitor, and blood pressure medication. He was on Sinemet 2 pills first thing in the morning and then 1-1/2 pills for the other 3 doses.   Today, 09/19/2016 (all dictated new, as well as above notes, some dictation done in note pad or Word, outside of chart, may appear as copied):  He reports doing okay, but L hip pain. Per wife, he had another stroke in Sep 2017. She was with him, he was found to be slumped over and non responsive, he was in the restaurant with her at the time. BP had been fluctuating. He was on Zoloft at the time. She took him to the ER herself after calling  911. She took him [place]. He was inpt for a week. As I understand, a stroke was not confirmed, was in [facility] for rehab. He has had more hallucinations, and even delusions, was accusing wife of cheating on her and having 2 babies. He was in rehab/SNF for about 4 weeks. His delusions are not currently as bad, hallucinations are about 3 times a week. He also is no longer fully continent. He fell out of his recliner 1 1/2 weeks ago, could not get up on his own, they had to call for help. Worsening L knee pain and L hip pain. Takes pain medication prn. I reviewed his CPAP compliance data from 08/16/2016 through 09/14/2016, which is a total of 30 days, during which time he used his machine every night except for 1, percent used days greater than 4 hours was 93%, indicating excellent compliance, set pressure at 12 cm with EPR of 3. Residual AHI 3.8 per hour which is adequate, leaked a little high with the 95th percentile at 28.3 L/m. He has not changed his nasal pillows in over 3 months.  The patient's allergies, current medications, family history, past medical history, past social history, past surgical history and problem list were reviewed and updated as appropriate.   Previously (copied from previous notes for reference):   I saw him on 01/30/2015, at which time he reported left knee pain and back pain. He was taking Percocet and was seeing neurosurgery for back pain, s/p low back surgery years ago. He had left total knee replacement surgery under  Dr. Maureen Ralphs in July 2014 and, in August 2014 he required hip surgery secondary to hip fracture. His wife reported that his memory was worse. He had a fall at church about 2 weeks prior. Unfortunately, he missed a step or misjudged it. He had seen Dr. Nicki Reaper at [facility] in March 2016 and a Sinemet CR 25-100 milligrams strength was added for his fourth dose. He was taking Sinemet 3 times a day, at 6, 10, and 2 PM. He was taking the CR around 6 PM. He does not feel it  has made much of a difference. Some time ago, he talked with Dr. Nicki Reaper about DBS surgery and was advised against it at the time. We discussed DBS evaluation again. I asked him to take Sinemet 4 times a day and Sinemet CR 50-200 milligrams strength around 9 PM. I last saw him on 05/16/2016, at which time he was poorly compliant with his AutoPap machine. Unfortunately, he was not doing very well in the prior months, his mobility has declined, affected also by arthritis and left hip pain. He had developed left facial droop in late August 2017 and was found to have a right pontine infarct. He was in rehabilitation until early September 2017. He was in an outpatient therapies. He was taking Sinemet 2 pills in the morning and 1-1/2 pills for the other 3 doses for the day. He was placed on a different antidepressant in the hospital and Lexapro had been stopped. I suggested we change his AutoPap to CPAP of 12 cm. I increased his Sinemet to 2 pills 4 times a day. His trazodone had been stopped or he had run out and I suggested he stay off of it.  05/16/2016: I reviewed his AutoPap compliance data from 05/02/2016 through 05/08/2016 which is only 7 days during which time he used his machine 7 days but percent used days greater than 4 hours was 29% only. Residual AHI 18.2 per hour, pressure settings of 7 cm to 18 cm. We do have 90 day compliance data available from 02/02/2016 through 05/01/2016, during which time his compliance was 32%, residual AHI of 14.4, this was on CPAP of 9 cm.  I saw him on 09/17/14, at which time he reported feeling well with CPAP, but had sleep disturbance due to nocturia and urinary incontinence at night. He did endorse sleeping better with CPAP. His Parkinson's for the most part was stable but he had more freezing and more tremors. I did not change his medications for Parkinson's disease. I asked him to continue using CPAP regularly.   I reviewed his CPAP compliance data from 12/24/2014 through  01/22/2015 which is a total of 30 days during which time he used his machine 28 days with percent used days greater than 4 hours at 70%, indicating adequate compliance with an average usage for all days of 5 hours and 3 minutes, residual AHI at 4 per hour, leak acceptable with the 95th percentile at 11.5 L/m on a pressure of 9 cm with EPR of 3.   I saw him on 05/02/2014, at which time he reported that he initially did not know how to use the CPAP machine but after he changed to another DME provider and received another machine, he was using it well. He felt improved with his sleep. He was compliant with treatment. He reported chronic back pain and right hip pain. He also reported foot pain and some depressive symptoms. He was tried on Wellbutrin in the past but felt worse.  He used to see Dr. Jim Like for his Parkinson's disease and was last seen by him on 01/03/2014, at which time he was taking Sinemet 4 times a day, starting at 7 AM every 4 hours. He was also on Mirapex 1 mg strength one pill 3 times a day and Azilect 1 mg once daily. He had mild wearing off symptoms of motor fluctuations. He had developed kidney stones which were treated in April 2015. He was first seen by Dr. Janann Colonel and January 2015 and prior to that he used to see Dr. Morene Antu and was last seen by him in March 2013 at which time they discussed potential DBS treatment. He was instructed to follow-up with Dr. Nicki Reaper at [facility] for further evaluation. He had participated in a Parkinson's disease study at [facility] for 5 years which she finished in 2014.    I reviewed his compliance data from 08/16/2014 through 09/14/2014 which is a total of 30 days during which time he used his CPAP every day except for 2 days with percent used days greater than 4 hours of only 50%, indicating suboptimal compliance with an average usage of 3 hours and 41 minutes, residual AHI acceptable at 4.9 per hour and leak acceptable with the 95th percentile at 12.5  L/m. Pressure at 9 cm with EPR of 3.   I first met him on 01/20/2014 at the request of Dr. Janann Colonel, at which time the patient reported that he was diagnosed with severe obstructive sleep apnea some 10 years ago. He reported being on CPAP but having difficulty tolerating CPAP. He reported cognitive decline as well as daytime somnolence and admitted to not be fully compliant with CPAP therapy. He reported panic attacks when he first put the mask on. He admitted not to have used CPAP in over a year. I suggested he return for a sleep study. 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 He was taking Benadryl 50 mg each night. He had been doing this for years. He had a split-night sleep study on 01/24/2014 and went over his test results with him in detail today. Baseline sleep efficiency was 92.9% with a latency to sleep of 2.5 minutes and wake after sleep onset of 4 minutes with no significant fragmentation noted. He had a normal arousal index. He had an increased percentage of stage II sleep, absence of deep sleep and absence of REM sleep. He had PACs and PVCs on EKG and no EEG changes. He had no significant PLMS. His total AHI was highly elevated at 45.2 events per hour. Baseline oxygen saturation was 93% with a nadir of 88%. He was titrated on CPAP during the rest of the test. Sleep efficiency was 90.6%. Arousal index was normal. He had an increased percentage of stage II sleep, absence of deep sleep and a normal percentage of REM sleep. Average oxygen saturation was 93% with a nadir of 88%. He had no significant periodic leg movements. He was titrated from 5-8 cm. He had some central apneas on the final pressure of 8 cm. Based on the overall test results I prescribed CPAP at a pressure of 8 cm using large nasal pillows.   I reviewed the patient's CPAP compliance data from 03/19/2014 to 04/17/2014, which is a total of 30 days, during which time the patient used CPAP every day. The average usage for all  days was 6 hours and 24 minutes. The percent used days greater than 4 hours was 90 %, indicating excellent compliance. The residual  AHI was 6 per hour, indicating a slightly suboptimal treatment pressure of 8 cwp with EPR of 3. Air leak from the mask was low at 7 L per minute at the 95th percentile. He may need an increase in his pressure, I felt.   His Past Medical History Is Significant For: Past Medical History:  Diagnosis Date  . Anxiety   . Cervical spondylosis   . Degenerative disc disease, cervical   . Depression   . Headache(784.0)   . Hypertension   . OA (osteoarthritis)   . OSA (obstructive sleep apnea)    SEVERE OSA PER STUDY 2005--  USES NASAL CANNULA  WITH SETTING AT 12 -- NO MASK  . Parkinson's disease ([place]) NEUROLOGIST--  DR Jim Like   IN A STUDY AT [facility]--- lov note crae everywhere 02-19-2013 dr Kalman Shan scott  . Right ureteral stone   . Spinal stenosis of lumbar region   . Urinary incontinence    SECONDARY TO PARKINSON'S DISEASE  . Wears glasses     His Past Surgical History Is Significant For: Past Surgical History:  Procedure Laterality Date  . CHOLECYSTECTOMY  2000  . CYSTOSCOPY WITH RETROGRADE PYELOGRAM, URETEROSCOPY AND STENT PLACEMENT Right 11/13/2013   Procedure: CYSTOSCOPY WITH RIGHT URETEROSCOPY, Right Retrograde Pyelogram, RIGHT URETERAL STENT PLACEMENT, Basket Stone Extraction;  Surgeon: Ardis Hughs, MD;  [facility];  Service: Urology;  Laterality: Right;  . HIP PINNING,CANNULATED Right 04/13/2013   Procedure: CANNULATED HIP PINNING;  Surgeon: Johnn Hai, MD;  Location: [facility];  Service: Orthopedics;  Laterality: Right;  PERCUTANEOUS CANNULATED RIGHT HIP PINNING   . HOLMIUM LASER APPLICATION Right 0/04/6044   Procedure: RIGHT LASER LITHOTRIPSY;  Surgeon: Ardis Hughs, MD;  Location[facility];  Service: Urology;  Laterality: Right;  . KNEE ARTHROSCOPY W/ DEBRIDEMENT Bilateral RIGHT  04-07-2008/    LEFT 10-13-2005  . LUMBAR LAMINECTOMY  01-05-2010   L2  --  L5  . TOTAL KNEE ARTHROPLASTY Left 03/04/2013   Procedure: LEFT TOTAL KNEE ARTHROPLASTY;  Surgeon: Gearlean Alf, MD;  Location: [facility];  Service: Orthopedics;  Laterality: Left;    His Family History Is Significant For: Family History  Problem Relation Age of Onset  . Cancer Mother   . Parkinson's disease      H/O  . Alzheimer's disease      H/O  . ALS      H/O    His Social History Is Significant For: Social History   Social History  . Marital status: Married    Spouse name: [place]  . Number of children: 1  . Years of education: Bachelor's   Occupational History  . RETIRED Not Employed   Social History Main Topics  . Smoking status: Never Smoker  . Smokeless tobacco: Never Used  . Alcohol use No  . Drug use: No  . Sexual activity: Not Asked   Other Topics Concern  . None   Social History Narrative   Patient is married and lives at home with his wife ([place]), has 1 child   Patient is right handed   Education level is Bachelor's   Caffeine consumption is 3 cups daily    His Allergies Are:  No Known Allergies:   His Current Medications Are:  Outpatient Encounter Prescriptions as of 09/19/2016  Medication Sig  . aspirin 81 MG chewable tablet Chew by mouth daily.  Marland Kitchen atorvastatin (LIPITOR) 20 MG tablet Take 1 tablet (20 mg total) by mouth daily  at 6 PM.  . carbidopa-levodopa (SINEMET CR) 50-200 MG per tablet Take 1 tablet by mouth at bedtime.  . carbidopa-levodopa (SINEMET IR) 25-100 MG tablet Take 2 tablets by mouth 4 (four) times daily. Take at 6 am, 10 am, 2 pm and 6 pm  . Cholecalciferol (VITAMIN D3) 1000 units CAPS Take by mouth.  . clopidogrel (PLAVIX) 75 MG tablet Take 1 tablet (75 mg total) by mouth daily.  Marland Kitchen lisinopril (PRINIVIL,ZESTRIL) 10 MG tablet Take 1 tablet (10 mg total) by mouth daily.  . Multiple Vitamin (MULTIVITAMIN) tablet Take 1 tablet by mouth daily.  Marland Kitchen  oxyCODONE-acetaminophen (PERCOCET) 10-325 MG tablet Take 1 tablet by mouth every 6 (six) hours as needed.  . pramipexole (MIRAPEX) 1 MG tablet Take 0.5 tablets (0.5 mg total) by mouth 3 (three) times daily.  . rasagiline (AZILECT) 1 MG TABS tablet Take 1 tablet (1 mg total) by mouth every morning.  . sertraline (ZOLOFT) 100 MG tablet Take 1 tablet by mouth daily.  . tamsulosin (FLOMAX) 0.4 MG CAPS capsule Take 1 capsule (0.4 mg total) by mouth daily.  . traZODone (DESYREL) 50 MG tablet Take 1 tablet (50 mg total) by mouth at bedtime.  . [DISCONTINUED] oxyCODONE-acetaminophen (PERCOCET/ROXICET) 5-325 MG tablet Take 1 tablet by mouth every 6 (six) hours as needed for severe pain.   No facility-administered encounter medications on file as of 09/19/2016.   :  Review of Systems:  Out of a complete 14 point review of systems, all are reviewed and negative with the exception of these symptoms as listed below: Review of Systems  Neurological:       Pt presents today to discuss his PD and osa on cpap. Pt says that he has had 2 strokes since the last time he was here. Pt thinks his cpap is broken; he will need [facility] to look at it.    Objective:  Neurologic Exam  Physical Exam Physical Examination:   Vitals:   09/19/16 1046  BP: 100/67  Pulse: 95  Resp: 20  General Examination: The patient is a very pleasant 66 y.o. male in no acute distress. He appears well-developed and well-nourished and well groomed.   HEENT: Normocephalic, atraumatic, pupils are equal, round and reactive to light and accommodation. Extraocular tracking is moderately impaired. He has no nystagmus. He has limitation to upper gaze. He has decreased eye blink rate, moderate facial masking, oropharynx exam reveals mild mouth dryness, moderately crowded airway. Mallampati is class III. Tongue protrudes centrally and palate elevates symmetrically. He has no significant drooling, speech is moderately hypophonic, not dysarthric.    Chest: Clear to auscultation without wheezing, rhonchi or crackles noted.  Heart: S1+S2+0, regular and normal without murmurs, rubs or gallops noted.   Abdomen: Soft, non-tender and non-distended with normal bowel sounds appreciated on auscultation.  Extremities: There is trace to 1+ edema in the distal lower extremities, left more than right.  Skin: Warm and dry without trophic changes noted.  Musculoskeletal: exam reveals left hip pain, limitation in range of motion in the left hip and left knee, with pain reported in both those areas. He has mild decrease in range of motion in the right hip, is status post right hip surgery.   Neurologically:  Mental status: The patient is awake, alert and oriented in all 4 spheres. His immediate and remote memory, attention, language skills and fund of knowledge are fairly appropriate. He is slow in thinking in responding. His wife reports most of his history and answers most  of the questions.   On 01/30/2015: MMSE: 28/30, CDT: 3/4, AFT: 11/min.   Motor exam: Normal bulk,  and strength is noted. He has limitation in range of motion in the left hip and knee area, minimal left-sided weakness is noted as well. No dyskinesias noted today, no significant resting tremor. Romberg is not testable safely. Fine motor skills are moderately impaired, slightly worse on the right. Sensory exam is intact to light touch throughout. No dysmetria or intention tremor is noted. Gait, station and balance: he stands with difficulty and has to push himself up. Posture is moderately stooped. Stance is wide-based. He walks with his cane. He has a slight limp on the left and walks slowly, arm swing is decreased, balance is impaired.   Assessment and Plan:    In summary, James Schultz is a very pleasant 66 y.o.-year old male With an underlying complex medical history of Parkinson's disease, diagnosed in 2007, depression, anxiety, lumbar spinal stenosis, degenerative disc  disease, kidney stone, osteoarthritis, status post left total knee replacement, status post right hip surgery, obesity, sleep apnea, stroke, TIA, and worsening chronic pain, who presents for follow-up consultation of his obstructive sleep apnea and PD. He is compliant with CPAP therapy and commended for this. He still believes that the machine is not always working properly, could be secondary to leak or ramp time, he is advised to get his machine looked at by his DME company and also to exchange the nasal pillows on a more regular basis. Motor-wise he has had progression and more decline. I suggested he continue with Sinemet 4 times a day 2 pills NCR at night, Azilect once daily in the late morning, Zoloft at night, Mirapex low-dose 3 times a day. He has an appointment with Dr. Nicki Reaper this week and I suggested they keep the appointment and I will see him back in 4 months. He has had more hallucinations and also at times delusions which are not currently a problem and hallucinations are not life altering or controlling at this time, down the road, he may be a candidate for  Nuplazid.  I asked him to continue to be active physically and mentally, it is critical that he continue to mobilize. He is impaired secondary to pain. He sees Dr. Tonita Cong and Dr. Maureen Ralphs for his hip and knee issues respectively.   I answered all her questions today and the patient was in agreement. We did talk about the importance of being proactive with regards to constipation issues, his constipation tends to get worse with pain medication use and he tries to use his narcotic pain medication sparingly.  I spent 40 minutes in total face-to-face time with the patient, more than 50% of which was spent in counseling and coordination of care, reviewing test results, reviewing medication and discussing or reviewing the diagnosis of PD, OSA, the prognosis and treatment options. Pertinent laboratory and imaging test results that were available during  this visit with the patient were reviewed by me and considered in my medical decision making (see chart for details).

## 2016-09-20 DIAGNOSIS — M6281 Muscle weakness (generalized): Secondary | ICD-10-CM | POA: Diagnosis not present

## 2016-09-20 DIAGNOSIS — Z8673 Personal history of transient ischemic attack (TIA), and cerebral infarction without residual deficits: Secondary | ICD-10-CM | POA: Diagnosis not present

## 2016-09-20 DIAGNOSIS — G20 Parkinson's disease: Secondary | ICD-10-CM | POA: Diagnosis not present

## 2016-09-20 DIAGNOSIS — R262 Difficulty in walking, not elsewhere classified: Secondary | ICD-10-CM | POA: Diagnosis not present

## 2016-09-22 DIAGNOSIS — G20 Parkinson's disease: Secondary | ICD-10-CM | POA: Diagnosis not present

## 2016-09-23 DIAGNOSIS — Z8673 Personal history of transient ischemic attack (TIA), and cerebral infarction without residual deficits: Secondary | ICD-10-CM | POA: Diagnosis not present

## 2016-09-23 DIAGNOSIS — G20 Parkinson's disease: Secondary | ICD-10-CM | POA: Diagnosis not present

## 2016-09-23 DIAGNOSIS — M6281 Muscle weakness (generalized): Secondary | ICD-10-CM | POA: Diagnosis not present

## 2016-09-23 DIAGNOSIS — R262 Difficulty in walking, not elsewhere classified: Secondary | ICD-10-CM | POA: Diagnosis not present

## 2016-09-26 DIAGNOSIS — Z8673 Personal history of transient ischemic attack (TIA), and cerebral infarction without residual deficits: Secondary | ICD-10-CM | POA: Diagnosis not present

## 2016-09-26 DIAGNOSIS — M6281 Muscle weakness (generalized): Secondary | ICD-10-CM | POA: Diagnosis not present

## 2016-09-26 DIAGNOSIS — R262 Difficulty in walking, not elsewhere classified: Secondary | ICD-10-CM | POA: Diagnosis not present

## 2016-09-26 DIAGNOSIS — G20 Parkinson's disease: Secondary | ICD-10-CM | POA: Diagnosis not present

## 2016-09-29 DIAGNOSIS — M6281 Muscle weakness (generalized): Secondary | ICD-10-CM | POA: Diagnosis not present

## 2016-09-29 DIAGNOSIS — G20 Parkinson's disease: Secondary | ICD-10-CM | POA: Diagnosis not present

## 2016-09-29 DIAGNOSIS — R262 Difficulty in walking, not elsewhere classified: Secondary | ICD-10-CM | POA: Diagnosis not present

## 2016-09-29 DIAGNOSIS — Z8673 Personal history of transient ischemic attack (TIA), and cerebral infarction without residual deficits: Secondary | ICD-10-CM | POA: Diagnosis not present

## 2016-10-03 DIAGNOSIS — G20 Parkinson's disease: Secondary | ICD-10-CM | POA: Diagnosis not present

## 2016-10-03 DIAGNOSIS — Z6835 Body mass index (BMI) 35.0-35.9, adult: Secondary | ICD-10-CM | POA: Diagnosis not present

## 2016-10-03 DIAGNOSIS — Z713 Dietary counseling and surveillance: Secondary | ICD-10-CM | POA: Diagnosis not present

## 2016-10-03 DIAGNOSIS — G4700 Insomnia, unspecified: Secondary | ICD-10-CM | POA: Diagnosis not present

## 2016-10-03 DIAGNOSIS — Z299 Encounter for prophylactic measures, unspecified: Secondary | ICD-10-CM | POA: Diagnosis not present

## 2016-10-04 DIAGNOSIS — R262 Difficulty in walking, not elsewhere classified: Secondary | ICD-10-CM | POA: Diagnosis not present

## 2016-10-04 DIAGNOSIS — Z8673 Personal history of transient ischemic attack (TIA), and cerebral infarction without residual deficits: Secondary | ICD-10-CM | POA: Diagnosis not present

## 2016-10-04 DIAGNOSIS — G20 Parkinson's disease: Secondary | ICD-10-CM | POA: Diagnosis not present

## 2016-10-04 DIAGNOSIS — M6281 Muscle weakness (generalized): Secondary | ICD-10-CM | POA: Diagnosis not present

## 2016-10-06 DIAGNOSIS — M6281 Muscle weakness (generalized): Secondary | ICD-10-CM | POA: Diagnosis not present

## 2016-10-06 DIAGNOSIS — G20 Parkinson's disease: Secondary | ICD-10-CM | POA: Diagnosis not present

## 2016-10-06 DIAGNOSIS — R262 Difficulty in walking, not elsewhere classified: Secondary | ICD-10-CM | POA: Diagnosis not present

## 2016-10-06 DIAGNOSIS — Z8673 Personal history of transient ischemic attack (TIA), and cerebral infarction without residual deficits: Secondary | ICD-10-CM | POA: Diagnosis not present

## 2016-10-10 DIAGNOSIS — R262 Difficulty in walking, not elsewhere classified: Secondary | ICD-10-CM | POA: Diagnosis not present

## 2016-10-10 DIAGNOSIS — Z8673 Personal history of transient ischemic attack (TIA), and cerebral infarction without residual deficits: Secondary | ICD-10-CM | POA: Diagnosis not present

## 2016-10-10 DIAGNOSIS — G20 Parkinson's disease: Secondary | ICD-10-CM | POA: Diagnosis not present

## 2016-10-10 DIAGNOSIS — M6281 Muscle weakness (generalized): Secondary | ICD-10-CM | POA: Diagnosis not present

## 2016-10-13 DIAGNOSIS — G20 Parkinson's disease: Secondary | ICD-10-CM | POA: Diagnosis not present

## 2016-10-13 DIAGNOSIS — R262 Difficulty in walking, not elsewhere classified: Secondary | ICD-10-CM | POA: Diagnosis not present

## 2016-10-13 DIAGNOSIS — M6281 Muscle weakness (generalized): Secondary | ICD-10-CM | POA: Diagnosis not present

## 2016-10-18 DIAGNOSIS — G20 Parkinson's disease: Secondary | ICD-10-CM | POA: Diagnosis not present

## 2016-10-18 DIAGNOSIS — R262 Difficulty in walking, not elsewhere classified: Secondary | ICD-10-CM | POA: Diagnosis not present

## 2016-10-18 DIAGNOSIS — M6281 Muscle weakness (generalized): Secondary | ICD-10-CM | POA: Diagnosis not present

## 2016-10-20 DIAGNOSIS — G8929 Other chronic pain: Secondary | ICD-10-CM | POA: Diagnosis not present

## 2016-10-20 DIAGNOSIS — M25552 Pain in left hip: Secondary | ICD-10-CM | POA: Diagnosis not present

## 2016-10-20 DIAGNOSIS — R262 Difficulty in walking, not elsewhere classified: Secondary | ICD-10-CM | POA: Diagnosis not present

## 2016-10-20 DIAGNOSIS — G20 Parkinson's disease: Secondary | ICD-10-CM | POA: Diagnosis not present

## 2016-10-20 DIAGNOSIS — M6281 Muscle weakness (generalized): Secondary | ICD-10-CM | POA: Diagnosis not present

## 2016-10-20 DIAGNOSIS — M1612 Unilateral primary osteoarthritis, left hip: Secondary | ICD-10-CM | POA: Diagnosis not present

## 2016-10-20 DIAGNOSIS — M25562 Pain in left knee: Secondary | ICD-10-CM | POA: Diagnosis not present

## 2016-10-25 DIAGNOSIS — R262 Difficulty in walking, not elsewhere classified: Secondary | ICD-10-CM | POA: Diagnosis not present

## 2016-10-25 DIAGNOSIS — G20 Parkinson's disease: Secondary | ICD-10-CM | POA: Diagnosis not present

## 2016-10-25 DIAGNOSIS — M6281 Muscle weakness (generalized): Secondary | ICD-10-CM | POA: Diagnosis not present

## 2016-10-27 DIAGNOSIS — M1612 Unilateral primary osteoarthritis, left hip: Secondary | ICD-10-CM | POA: Diagnosis not present

## 2016-10-28 DIAGNOSIS — G20 Parkinson's disease: Secondary | ICD-10-CM | POA: Diagnosis not present

## 2016-10-28 DIAGNOSIS — M6281 Muscle weakness (generalized): Secondary | ICD-10-CM | POA: Diagnosis not present

## 2016-10-28 DIAGNOSIS — R262 Difficulty in walking, not elsewhere classified: Secondary | ICD-10-CM | POA: Diagnosis not present

## 2016-11-01 DIAGNOSIS — G20 Parkinson's disease: Secondary | ICD-10-CM | POA: Diagnosis not present

## 2016-11-01 DIAGNOSIS — M6281 Muscle weakness (generalized): Secondary | ICD-10-CM | POA: Diagnosis not present

## 2016-11-01 DIAGNOSIS — R262 Difficulty in walking, not elsewhere classified: Secondary | ICD-10-CM | POA: Diagnosis not present

## 2016-11-08 DIAGNOSIS — R262 Difficulty in walking, not elsewhere classified: Secondary | ICD-10-CM | POA: Diagnosis not present

## 2016-11-08 DIAGNOSIS — M6281 Muscle weakness (generalized): Secondary | ICD-10-CM | POA: Diagnosis not present

## 2016-11-08 DIAGNOSIS — G20 Parkinson's disease: Secondary | ICD-10-CM | POA: Diagnosis not present

## 2016-11-23 DIAGNOSIS — Z713 Dietary counseling and surveillance: Secondary | ICD-10-CM | POA: Diagnosis not present

## 2016-11-23 DIAGNOSIS — G20 Parkinson's disease: Secondary | ICD-10-CM | POA: Diagnosis not present

## 2016-11-23 DIAGNOSIS — Z6833 Body mass index (BMI) 33.0-33.9, adult: Secondary | ICD-10-CM | POA: Diagnosis not present

## 2016-11-23 DIAGNOSIS — I6350 Cerebral infarction due to unspecified occlusion or stenosis of unspecified cerebral artery: Secondary | ICD-10-CM | POA: Diagnosis not present

## 2016-11-23 DIAGNOSIS — Z299 Encounter for prophylactic measures, unspecified: Secondary | ICD-10-CM | POA: Diagnosis not present

## 2016-11-29 DIAGNOSIS — M4726 Other spondylosis with radiculopathy, lumbar region: Secondary | ICD-10-CM | POA: Diagnosis not present

## 2016-11-29 DIAGNOSIS — G20 Parkinson's disease: Secondary | ICD-10-CM | POA: Diagnosis not present

## 2016-11-29 DIAGNOSIS — M47816 Spondylosis without myelopathy or radiculopathy, lumbar region: Secondary | ICD-10-CM | POA: Diagnosis not present

## 2016-12-05 DIAGNOSIS — G20 Parkinson's disease: Secondary | ICD-10-CM | POA: Diagnosis not present

## 2016-12-05 DIAGNOSIS — M1990 Unspecified osteoarthritis, unspecified site: Secondary | ICD-10-CM | POA: Diagnosis not present

## 2016-12-05 DIAGNOSIS — F0280 Dementia in other diseases classified elsewhere without behavioral disturbance: Secondary | ICD-10-CM | POA: Diagnosis not present

## 2016-12-05 DIAGNOSIS — M6281 Muscle weakness (generalized): Secondary | ICD-10-CM | POA: Diagnosis not present

## 2016-12-05 DIAGNOSIS — G8929 Other chronic pain: Secondary | ICD-10-CM | POA: Diagnosis not present

## 2016-12-05 DIAGNOSIS — I10 Essential (primary) hypertension: Secondary | ICD-10-CM | POA: Diagnosis not present

## 2016-12-06 DIAGNOSIS — G8929 Other chronic pain: Secondary | ICD-10-CM | POA: Diagnosis not present

## 2016-12-06 DIAGNOSIS — G20 Parkinson's disease: Secondary | ICD-10-CM | POA: Diagnosis not present

## 2016-12-06 DIAGNOSIS — I10 Essential (primary) hypertension: Secondary | ICD-10-CM | POA: Diagnosis not present

## 2016-12-06 DIAGNOSIS — M6281 Muscle weakness (generalized): Secondary | ICD-10-CM | POA: Diagnosis not present

## 2016-12-06 DIAGNOSIS — F0280 Dementia in other diseases classified elsewhere without behavioral disturbance: Secondary | ICD-10-CM | POA: Diagnosis not present

## 2016-12-06 DIAGNOSIS — M1990 Unspecified osteoarthritis, unspecified site: Secondary | ICD-10-CM | POA: Diagnosis not present

## 2016-12-08 DIAGNOSIS — F0280 Dementia in other diseases classified elsewhere without behavioral disturbance: Secondary | ICD-10-CM | POA: Diagnosis not present

## 2016-12-08 DIAGNOSIS — G20 Parkinson's disease: Secondary | ICD-10-CM | POA: Diagnosis not present

## 2016-12-08 DIAGNOSIS — M1990 Unspecified osteoarthritis, unspecified site: Secondary | ICD-10-CM | POA: Diagnosis not present

## 2016-12-08 DIAGNOSIS — M6281 Muscle weakness (generalized): Secondary | ICD-10-CM | POA: Diagnosis not present

## 2016-12-08 DIAGNOSIS — I10 Essential (primary) hypertension: Secondary | ICD-10-CM | POA: Diagnosis not present

## 2016-12-08 DIAGNOSIS — G8929 Other chronic pain: Secondary | ICD-10-CM | POA: Diagnosis not present

## 2016-12-10 DIAGNOSIS — M6281 Muscle weakness (generalized): Secondary | ICD-10-CM | POA: Diagnosis not present

## 2016-12-10 DIAGNOSIS — M1990 Unspecified osteoarthritis, unspecified site: Secondary | ICD-10-CM | POA: Diagnosis not present

## 2016-12-10 DIAGNOSIS — G8929 Other chronic pain: Secondary | ICD-10-CM | POA: Diagnosis not present

## 2016-12-10 DIAGNOSIS — I10 Essential (primary) hypertension: Secondary | ICD-10-CM | POA: Diagnosis not present

## 2016-12-10 DIAGNOSIS — G20 Parkinson's disease: Secondary | ICD-10-CM | POA: Diagnosis not present

## 2016-12-10 DIAGNOSIS — F0280 Dementia in other diseases classified elsewhere without behavioral disturbance: Secondary | ICD-10-CM | POA: Diagnosis not present

## 2016-12-12 DIAGNOSIS — M6281 Muscle weakness (generalized): Secondary | ICD-10-CM | POA: Diagnosis not present

## 2016-12-12 DIAGNOSIS — M1990 Unspecified osteoarthritis, unspecified site: Secondary | ICD-10-CM | POA: Diagnosis not present

## 2016-12-12 DIAGNOSIS — I10 Essential (primary) hypertension: Secondary | ICD-10-CM | POA: Diagnosis not present

## 2016-12-12 DIAGNOSIS — G20 Parkinson's disease: Secondary | ICD-10-CM | POA: Diagnosis not present

## 2016-12-12 DIAGNOSIS — F0280 Dementia in other diseases classified elsewhere without behavioral disturbance: Secondary | ICD-10-CM | POA: Diagnosis not present

## 2016-12-12 DIAGNOSIS — G8929 Other chronic pain: Secondary | ICD-10-CM | POA: Diagnosis not present

## 2016-12-13 DIAGNOSIS — G20 Parkinson's disease: Secondary | ICD-10-CM | POA: Diagnosis not present

## 2016-12-13 DIAGNOSIS — F0280 Dementia in other diseases classified elsewhere without behavioral disturbance: Secondary | ICD-10-CM | POA: Diagnosis not present

## 2016-12-13 DIAGNOSIS — I10 Essential (primary) hypertension: Secondary | ICD-10-CM | POA: Diagnosis not present

## 2016-12-13 DIAGNOSIS — G8929 Other chronic pain: Secondary | ICD-10-CM | POA: Diagnosis not present

## 2016-12-13 DIAGNOSIS — M1990 Unspecified osteoarthritis, unspecified site: Secondary | ICD-10-CM | POA: Diagnosis not present

## 2016-12-13 DIAGNOSIS — M6281 Muscle weakness (generalized): Secondary | ICD-10-CM | POA: Diagnosis not present

## 2016-12-14 DIAGNOSIS — M6281 Muscle weakness (generalized): Secondary | ICD-10-CM | POA: Diagnosis not present

## 2016-12-14 DIAGNOSIS — G8929 Other chronic pain: Secondary | ICD-10-CM | POA: Diagnosis not present

## 2016-12-14 DIAGNOSIS — G20 Parkinson's disease: Secondary | ICD-10-CM | POA: Diagnosis not present

## 2016-12-14 DIAGNOSIS — M1990 Unspecified osteoarthritis, unspecified site: Secondary | ICD-10-CM | POA: Diagnosis not present

## 2016-12-14 DIAGNOSIS — I10 Essential (primary) hypertension: Secondary | ICD-10-CM | POA: Diagnosis not present

## 2016-12-14 DIAGNOSIS — F0280 Dementia in other diseases classified elsewhere without behavioral disturbance: Secondary | ICD-10-CM | POA: Diagnosis not present

## 2016-12-16 DIAGNOSIS — G8929 Other chronic pain: Secondary | ICD-10-CM | POA: Diagnosis not present

## 2016-12-16 DIAGNOSIS — M6281 Muscle weakness (generalized): Secondary | ICD-10-CM | POA: Diagnosis not present

## 2016-12-16 DIAGNOSIS — I10 Essential (primary) hypertension: Secondary | ICD-10-CM | POA: Diagnosis not present

## 2016-12-16 DIAGNOSIS — F0280 Dementia in other diseases classified elsewhere without behavioral disturbance: Secondary | ICD-10-CM | POA: Diagnosis not present

## 2016-12-16 DIAGNOSIS — M1990 Unspecified osteoarthritis, unspecified site: Secondary | ICD-10-CM | POA: Diagnosis not present

## 2016-12-16 DIAGNOSIS — G20 Parkinson's disease: Secondary | ICD-10-CM | POA: Diagnosis not present

## 2016-12-17 DIAGNOSIS — F0280 Dementia in other diseases classified elsewhere without behavioral disturbance: Secondary | ICD-10-CM | POA: Diagnosis not present

## 2016-12-17 DIAGNOSIS — G8929 Other chronic pain: Secondary | ICD-10-CM | POA: Diagnosis not present

## 2016-12-17 DIAGNOSIS — M1990 Unspecified osteoarthritis, unspecified site: Secondary | ICD-10-CM | POA: Diagnosis not present

## 2016-12-17 DIAGNOSIS — M6281 Muscle weakness (generalized): Secondary | ICD-10-CM | POA: Diagnosis not present

## 2016-12-17 DIAGNOSIS — I10 Essential (primary) hypertension: Secondary | ICD-10-CM | POA: Diagnosis not present

## 2016-12-17 DIAGNOSIS — G20 Parkinson's disease: Secondary | ICD-10-CM | POA: Diagnosis not present

## 2016-12-18 DIAGNOSIS — Z9049 Acquired absence of other specified parts of digestive tract: Secondary | ICD-10-CM | POA: Diagnosis not present

## 2016-12-18 DIAGNOSIS — R262 Difficulty in walking, not elsewhere classified: Secondary | ICD-10-CM | POA: Diagnosis not present

## 2016-12-18 DIAGNOSIS — Z818 Family history of other mental and behavioral disorders: Secondary | ICD-10-CM | POA: Diagnosis not present

## 2016-12-18 DIAGNOSIS — M47814 Spondylosis without myelopathy or radiculopathy, thoracic region: Secondary | ICD-10-CM | POA: Diagnosis not present

## 2016-12-18 DIAGNOSIS — Z9181 History of falling: Secondary | ICD-10-CM | POA: Diagnosis not present

## 2016-12-18 DIAGNOSIS — I4891 Unspecified atrial fibrillation: Secondary | ICD-10-CM | POA: Diagnosis not present

## 2016-12-18 DIAGNOSIS — N400 Enlarged prostate without lower urinary tract symptoms: Secondary | ICD-10-CM | POA: Diagnosis not present

## 2016-12-18 DIAGNOSIS — G4730 Sleep apnea, unspecified: Secondary | ICD-10-CM | POA: Diagnosis not present

## 2016-12-18 DIAGNOSIS — L89159 Pressure ulcer of sacral region, unspecified stage: Secondary | ICD-10-CM | POA: Diagnosis not present

## 2016-12-18 DIAGNOSIS — S299XXA Unspecified injury of thorax, initial encounter: Secondary | ICD-10-CM | POA: Diagnosis not present

## 2016-12-18 DIAGNOSIS — I10 Essential (primary) hypertension: Secondary | ICD-10-CM | POA: Diagnosis not present

## 2016-12-18 DIAGNOSIS — S199XXA Unspecified injury of neck, initial encounter: Secondary | ICD-10-CM | POA: Diagnosis not present

## 2016-12-18 DIAGNOSIS — Z8249 Family history of ischemic heart disease and other diseases of the circulatory system: Secondary | ICD-10-CM | POA: Diagnosis not present

## 2016-12-18 DIAGNOSIS — F0280 Dementia in other diseases classified elsewhere without behavioral disturbance: Secondary | ICD-10-CM | POA: Diagnosis not present

## 2016-12-18 DIAGNOSIS — Z79899 Other long term (current) drug therapy: Secondary | ICD-10-CM | POA: Diagnosis not present

## 2016-12-18 DIAGNOSIS — Z7982 Long term (current) use of aspirin: Secondary | ICD-10-CM | POA: Diagnosis not present

## 2016-12-18 DIAGNOSIS — G20 Parkinson's disease: Secondary | ICD-10-CM | POA: Diagnosis not present

## 2016-12-18 DIAGNOSIS — M4802 Spinal stenosis, cervical region: Secondary | ICD-10-CM | POA: Diagnosis not present

## 2016-12-18 DIAGNOSIS — F329 Major depressive disorder, single episode, unspecified: Secondary | ICD-10-CM | POA: Diagnosis not present

## 2016-12-18 DIAGNOSIS — S0990XA Unspecified injury of head, initial encounter: Secondary | ICD-10-CM | POA: Diagnosis not present

## 2016-12-18 DIAGNOSIS — W010XXA Fall on same level from slipping, tripping and stumbling without subsequent striking against object, initial encounter: Secondary | ICD-10-CM | POA: Diagnosis not present

## 2016-12-18 DIAGNOSIS — S0101XA Laceration without foreign body of scalp, initial encounter: Secondary | ICD-10-CM | POA: Diagnosis not present

## 2016-12-18 DIAGNOSIS — M47812 Spondylosis without myelopathy or radiculopathy, cervical region: Secondary | ICD-10-CM | POA: Diagnosis not present

## 2016-12-18 DIAGNOSIS — Z7902 Long term (current) use of antithrombotics/antiplatelets: Secondary | ICD-10-CM | POA: Diagnosis not present

## 2016-12-18 DIAGNOSIS — Z8673 Personal history of transient ischemic attack (TIA), and cerebral infarction without residual deficits: Secondary | ICD-10-CM | POA: Diagnosis not present

## 2016-12-19 DIAGNOSIS — I10 Essential (primary) hypertension: Secondary | ICD-10-CM | POA: Diagnosis not present

## 2016-12-19 DIAGNOSIS — G20 Parkinson's disease: Secondary | ICD-10-CM | POA: Diagnosis not present

## 2016-12-19 DIAGNOSIS — S0001XA Abrasion of scalp, initial encounter: Secondary | ICD-10-CM | POA: Diagnosis not present

## 2016-12-19 DIAGNOSIS — S0990XA Unspecified injury of head, initial encounter: Secondary | ICD-10-CM | POA: Diagnosis not present

## 2016-12-19 DIAGNOSIS — S0101XA Laceration without foreign body of scalp, initial encounter: Secondary | ICD-10-CM | POA: Diagnosis not present

## 2016-12-20 DIAGNOSIS — G20 Parkinson's disease: Secondary | ICD-10-CM | POA: Diagnosis not present

## 2016-12-20 DIAGNOSIS — I10 Essential (primary) hypertension: Secondary | ICD-10-CM | POA: Diagnosis not present

## 2016-12-20 DIAGNOSIS — S0001XA Abrasion of scalp, initial encounter: Secondary | ICD-10-CM | POA: Diagnosis not present

## 2016-12-20 DIAGNOSIS — S0990XA Unspecified injury of head, initial encounter: Secondary | ICD-10-CM | POA: Diagnosis not present

## 2016-12-20 DIAGNOSIS — S0101XA Laceration without foreign body of scalp, initial encounter: Secondary | ICD-10-CM | POA: Diagnosis not present

## 2016-12-21 DIAGNOSIS — G8929 Other chronic pain: Secondary | ICD-10-CM | POA: Diagnosis not present

## 2016-12-21 DIAGNOSIS — F0280 Dementia in other diseases classified elsewhere without behavioral disturbance: Secondary | ICD-10-CM | POA: Diagnosis not present

## 2016-12-21 DIAGNOSIS — G20 Parkinson's disease: Secondary | ICD-10-CM | POA: Diagnosis not present

## 2016-12-21 DIAGNOSIS — M6281 Muscle weakness (generalized): Secondary | ICD-10-CM | POA: Diagnosis not present

## 2016-12-21 DIAGNOSIS — M1990 Unspecified osteoarthritis, unspecified site: Secondary | ICD-10-CM | POA: Diagnosis not present

## 2016-12-21 DIAGNOSIS — I10 Essential (primary) hypertension: Secondary | ICD-10-CM | POA: Diagnosis not present

## 2016-12-23 DIAGNOSIS — G20 Parkinson's disease: Secondary | ICD-10-CM | POA: Diagnosis not present

## 2016-12-23 DIAGNOSIS — G8929 Other chronic pain: Secondary | ICD-10-CM | POA: Diagnosis not present

## 2016-12-23 DIAGNOSIS — M1990 Unspecified osteoarthritis, unspecified site: Secondary | ICD-10-CM | POA: Diagnosis not present

## 2016-12-23 DIAGNOSIS — I10 Essential (primary) hypertension: Secondary | ICD-10-CM | POA: Diagnosis not present

## 2016-12-23 DIAGNOSIS — M6281 Muscle weakness (generalized): Secondary | ICD-10-CM | POA: Diagnosis not present

## 2016-12-23 DIAGNOSIS — F0280 Dementia in other diseases classified elsewhere without behavioral disturbance: Secondary | ICD-10-CM | POA: Diagnosis not present

## 2016-12-26 DIAGNOSIS — M1990 Unspecified osteoarthritis, unspecified site: Secondary | ICD-10-CM | POA: Diagnosis not present

## 2016-12-26 DIAGNOSIS — G20 Parkinson's disease: Secondary | ICD-10-CM | POA: Diagnosis not present

## 2016-12-26 DIAGNOSIS — G8929 Other chronic pain: Secondary | ICD-10-CM | POA: Diagnosis not present

## 2016-12-26 DIAGNOSIS — M6281 Muscle weakness (generalized): Secondary | ICD-10-CM | POA: Diagnosis not present

## 2016-12-26 DIAGNOSIS — I10 Essential (primary) hypertension: Secondary | ICD-10-CM | POA: Diagnosis not present

## 2016-12-26 DIAGNOSIS — F0280 Dementia in other diseases classified elsewhere without behavioral disturbance: Secondary | ICD-10-CM | POA: Diagnosis not present

## 2016-12-27 DIAGNOSIS — G8929 Other chronic pain: Secondary | ICD-10-CM | POA: Diagnosis not present

## 2016-12-27 DIAGNOSIS — I10 Essential (primary) hypertension: Secondary | ICD-10-CM | POA: Diagnosis not present

## 2016-12-27 DIAGNOSIS — M6281 Muscle weakness (generalized): Secondary | ICD-10-CM | POA: Diagnosis not present

## 2016-12-27 DIAGNOSIS — G20 Parkinson's disease: Secondary | ICD-10-CM | POA: Diagnosis not present

## 2016-12-27 DIAGNOSIS — M1990 Unspecified osteoarthritis, unspecified site: Secondary | ICD-10-CM | POA: Diagnosis not present

## 2016-12-27 DIAGNOSIS — F0280 Dementia in other diseases classified elsewhere without behavioral disturbance: Secondary | ICD-10-CM | POA: Diagnosis not present

## 2016-12-28 DIAGNOSIS — G8929 Other chronic pain: Secondary | ICD-10-CM | POA: Diagnosis not present

## 2016-12-28 DIAGNOSIS — G20 Parkinson's disease: Secondary | ICD-10-CM | POA: Diagnosis not present

## 2016-12-28 DIAGNOSIS — Z299 Encounter for prophylactic measures, unspecified: Secondary | ICD-10-CM | POA: Diagnosis not present

## 2016-12-28 DIAGNOSIS — F321 Major depressive disorder, single episode, moderate: Secondary | ICD-10-CM | POA: Diagnosis not present

## 2016-12-28 DIAGNOSIS — M6281 Muscle weakness (generalized): Secondary | ICD-10-CM | POA: Diagnosis not present

## 2016-12-28 DIAGNOSIS — I10 Essential (primary) hypertension: Secondary | ICD-10-CM | POA: Diagnosis not present

## 2016-12-28 DIAGNOSIS — S0990XA Unspecified injury of head, initial encounter: Secondary | ICD-10-CM | POA: Diagnosis not present

## 2016-12-28 DIAGNOSIS — L89309 Pressure ulcer of unspecified buttock, unspecified stage: Secondary | ICD-10-CM | POA: Diagnosis not present

## 2016-12-28 DIAGNOSIS — M1990 Unspecified osteoarthritis, unspecified site: Secondary | ICD-10-CM | POA: Diagnosis not present

## 2016-12-28 DIAGNOSIS — F0280 Dementia in other diseases classified elsewhere without behavioral disturbance: Secondary | ICD-10-CM | POA: Diagnosis not present

## 2016-12-29 DIAGNOSIS — F0280 Dementia in other diseases classified elsewhere without behavioral disturbance: Secondary | ICD-10-CM | POA: Diagnosis not present

## 2016-12-29 DIAGNOSIS — M6281 Muscle weakness (generalized): Secondary | ICD-10-CM | POA: Diagnosis not present

## 2016-12-29 DIAGNOSIS — I10 Essential (primary) hypertension: Secondary | ICD-10-CM | POA: Diagnosis not present

## 2016-12-29 DIAGNOSIS — M1990 Unspecified osteoarthritis, unspecified site: Secondary | ICD-10-CM | POA: Diagnosis not present

## 2016-12-29 DIAGNOSIS — G8929 Other chronic pain: Secondary | ICD-10-CM | POA: Diagnosis not present

## 2016-12-29 DIAGNOSIS — G20 Parkinson's disease: Secondary | ICD-10-CM | POA: Diagnosis not present

## 2016-12-30 DIAGNOSIS — G20 Parkinson's disease: Secondary | ICD-10-CM | POA: Diagnosis not present

## 2016-12-30 DIAGNOSIS — F0280 Dementia in other diseases classified elsewhere without behavioral disturbance: Secondary | ICD-10-CM | POA: Diagnosis not present

## 2016-12-30 DIAGNOSIS — M1990 Unspecified osteoarthritis, unspecified site: Secondary | ICD-10-CM | POA: Diagnosis not present

## 2016-12-30 DIAGNOSIS — G8929 Other chronic pain: Secondary | ICD-10-CM | POA: Diagnosis not present

## 2016-12-30 DIAGNOSIS — M6281 Muscle weakness (generalized): Secondary | ICD-10-CM | POA: Diagnosis not present

## 2016-12-30 DIAGNOSIS — I10 Essential (primary) hypertension: Secondary | ICD-10-CM | POA: Diagnosis not present

## 2016-12-31 DIAGNOSIS — I10 Essential (primary) hypertension: Secondary | ICD-10-CM | POA: Diagnosis not present

## 2016-12-31 DIAGNOSIS — G20 Parkinson's disease: Secondary | ICD-10-CM | POA: Diagnosis not present

## 2016-12-31 DIAGNOSIS — F0280 Dementia in other diseases classified elsewhere without behavioral disturbance: Secondary | ICD-10-CM | POA: Diagnosis not present

## 2016-12-31 DIAGNOSIS — M6281 Muscle weakness (generalized): Secondary | ICD-10-CM | POA: Diagnosis not present

## 2016-12-31 DIAGNOSIS — M1990 Unspecified osteoarthritis, unspecified site: Secondary | ICD-10-CM | POA: Diagnosis not present

## 2016-12-31 DIAGNOSIS — G8929 Other chronic pain: Secondary | ICD-10-CM | POA: Diagnosis not present

## 2017-01-02 DIAGNOSIS — M6281 Muscle weakness (generalized): Secondary | ICD-10-CM | POA: Diagnosis not present

## 2017-01-02 DIAGNOSIS — F0280 Dementia in other diseases classified elsewhere without behavioral disturbance: Secondary | ICD-10-CM | POA: Diagnosis not present

## 2017-01-02 DIAGNOSIS — I10 Essential (primary) hypertension: Secondary | ICD-10-CM | POA: Diagnosis not present

## 2017-01-02 DIAGNOSIS — G8929 Other chronic pain: Secondary | ICD-10-CM | POA: Diagnosis not present

## 2017-01-02 DIAGNOSIS — G20 Parkinson's disease: Secondary | ICD-10-CM | POA: Diagnosis not present

## 2017-01-02 DIAGNOSIS — M1990 Unspecified osteoarthritis, unspecified site: Secondary | ICD-10-CM | POA: Diagnosis not present

## 2017-01-03 DIAGNOSIS — I10 Essential (primary) hypertension: Secondary | ICD-10-CM | POA: Diagnosis not present

## 2017-01-03 DIAGNOSIS — M1990 Unspecified osteoarthritis, unspecified site: Secondary | ICD-10-CM | POA: Diagnosis not present

## 2017-01-03 DIAGNOSIS — M6281 Muscle weakness (generalized): Secondary | ICD-10-CM | POA: Diagnosis not present

## 2017-01-03 DIAGNOSIS — G20 Parkinson's disease: Secondary | ICD-10-CM | POA: Diagnosis not present

## 2017-01-03 DIAGNOSIS — F0280 Dementia in other diseases classified elsewhere without behavioral disturbance: Secondary | ICD-10-CM | POA: Diagnosis not present

## 2017-01-03 DIAGNOSIS — G8929 Other chronic pain: Secondary | ICD-10-CM | POA: Diagnosis not present

## 2017-01-04 DIAGNOSIS — F0280 Dementia in other diseases classified elsewhere without behavioral disturbance: Secondary | ICD-10-CM | POA: Diagnosis not present

## 2017-01-04 DIAGNOSIS — M6281 Muscle weakness (generalized): Secondary | ICD-10-CM | POA: Diagnosis not present

## 2017-01-04 DIAGNOSIS — M1990 Unspecified osteoarthritis, unspecified site: Secondary | ICD-10-CM | POA: Diagnosis not present

## 2017-01-04 DIAGNOSIS — I10 Essential (primary) hypertension: Secondary | ICD-10-CM | POA: Diagnosis not present

## 2017-01-04 DIAGNOSIS — G20 Parkinson's disease: Secondary | ICD-10-CM | POA: Diagnosis not present

## 2017-01-04 DIAGNOSIS — G8929 Other chronic pain: Secondary | ICD-10-CM | POA: Diagnosis not present

## 2017-01-05 DIAGNOSIS — G20 Parkinson's disease: Secondary | ICD-10-CM | POA: Diagnosis not present

## 2017-01-05 DIAGNOSIS — I10 Essential (primary) hypertension: Secondary | ICD-10-CM | POA: Diagnosis not present

## 2017-01-05 DIAGNOSIS — G8929 Other chronic pain: Secondary | ICD-10-CM | POA: Diagnosis not present

## 2017-01-05 DIAGNOSIS — M1990 Unspecified osteoarthritis, unspecified site: Secondary | ICD-10-CM | POA: Diagnosis not present

## 2017-01-05 DIAGNOSIS — M6281 Muscle weakness (generalized): Secondary | ICD-10-CM | POA: Diagnosis not present

## 2017-01-05 DIAGNOSIS — F0280 Dementia in other diseases classified elsewhere without behavioral disturbance: Secondary | ICD-10-CM | POA: Diagnosis not present

## 2017-01-06 DIAGNOSIS — I10 Essential (primary) hypertension: Secondary | ICD-10-CM | POA: Diagnosis not present

## 2017-01-06 DIAGNOSIS — M1990 Unspecified osteoarthritis, unspecified site: Secondary | ICD-10-CM | POA: Diagnosis not present

## 2017-01-06 DIAGNOSIS — M6281 Muscle weakness (generalized): Secondary | ICD-10-CM | POA: Diagnosis not present

## 2017-01-06 DIAGNOSIS — G20 Parkinson's disease: Secondary | ICD-10-CM | POA: Diagnosis not present

## 2017-01-06 DIAGNOSIS — G8929 Other chronic pain: Secondary | ICD-10-CM | POA: Diagnosis not present

## 2017-01-06 DIAGNOSIS — F0280 Dementia in other diseases classified elsewhere without behavioral disturbance: Secondary | ICD-10-CM | POA: Diagnosis not present

## 2017-01-10 DIAGNOSIS — I10 Essential (primary) hypertension: Secondary | ICD-10-CM | POA: Diagnosis not present

## 2017-01-10 DIAGNOSIS — M1990 Unspecified osteoarthritis, unspecified site: Secondary | ICD-10-CM | POA: Diagnosis not present

## 2017-01-10 DIAGNOSIS — G8929 Other chronic pain: Secondary | ICD-10-CM | POA: Diagnosis not present

## 2017-01-10 DIAGNOSIS — G20 Parkinson's disease: Secondary | ICD-10-CM | POA: Diagnosis not present

## 2017-01-10 DIAGNOSIS — M6281 Muscle weakness (generalized): Secondary | ICD-10-CM | POA: Diagnosis not present

## 2017-01-10 DIAGNOSIS — F0280 Dementia in other diseases classified elsewhere without behavioral disturbance: Secondary | ICD-10-CM | POA: Diagnosis not present

## 2017-01-11 DIAGNOSIS — I10 Essential (primary) hypertension: Secondary | ICD-10-CM | POA: Diagnosis not present

## 2017-01-11 DIAGNOSIS — M1990 Unspecified osteoarthritis, unspecified site: Secondary | ICD-10-CM | POA: Diagnosis not present

## 2017-01-11 DIAGNOSIS — F0280 Dementia in other diseases classified elsewhere without behavioral disturbance: Secondary | ICD-10-CM | POA: Diagnosis not present

## 2017-01-11 DIAGNOSIS — G8929 Other chronic pain: Secondary | ICD-10-CM | POA: Diagnosis not present

## 2017-01-11 DIAGNOSIS — G20 Parkinson's disease: Secondary | ICD-10-CM | POA: Diagnosis not present

## 2017-01-11 DIAGNOSIS — M6281 Muscle weakness (generalized): Secondary | ICD-10-CM | POA: Diagnosis not present

## 2017-01-12 DIAGNOSIS — F0280 Dementia in other diseases classified elsewhere without behavioral disturbance: Secondary | ICD-10-CM | POA: Diagnosis not present

## 2017-01-12 DIAGNOSIS — G20 Parkinson's disease: Secondary | ICD-10-CM | POA: Diagnosis not present

## 2017-01-12 DIAGNOSIS — I10 Essential (primary) hypertension: Secondary | ICD-10-CM | POA: Diagnosis not present

## 2017-01-12 DIAGNOSIS — M1990 Unspecified osteoarthritis, unspecified site: Secondary | ICD-10-CM | POA: Diagnosis not present

## 2017-01-12 DIAGNOSIS — G8929 Other chronic pain: Secondary | ICD-10-CM | POA: Diagnosis not present

## 2017-01-12 DIAGNOSIS — M6281 Muscle weakness (generalized): Secondary | ICD-10-CM | POA: Diagnosis not present

## 2017-01-13 DIAGNOSIS — I10 Essential (primary) hypertension: Secondary | ICD-10-CM | POA: Diagnosis not present

## 2017-01-13 DIAGNOSIS — G20 Parkinson's disease: Secondary | ICD-10-CM | POA: Diagnosis not present

## 2017-01-13 DIAGNOSIS — M6281 Muscle weakness (generalized): Secondary | ICD-10-CM | POA: Diagnosis not present

## 2017-01-13 DIAGNOSIS — G8929 Other chronic pain: Secondary | ICD-10-CM | POA: Diagnosis not present

## 2017-01-13 DIAGNOSIS — F0280 Dementia in other diseases classified elsewhere without behavioral disturbance: Secondary | ICD-10-CM | POA: Diagnosis not present

## 2017-01-13 DIAGNOSIS — M1990 Unspecified osteoarthritis, unspecified site: Secondary | ICD-10-CM | POA: Diagnosis not present

## 2017-01-17 ENCOUNTER — Ambulatory Visit (INDEPENDENT_AMBULATORY_CARE_PROVIDER_SITE_OTHER): Payer: Medicare Other | Admitting: Neurology

## 2017-01-17 ENCOUNTER — Encounter: Payer: Self-pay | Admitting: Neurology

## 2017-01-17 VITALS — BP 120/78 | HR 83

## 2017-01-17 DIAGNOSIS — G20 Parkinson's disease: Secondary | ICD-10-CM | POA: Diagnosis not present

## 2017-01-17 DIAGNOSIS — I6350 Cerebral infarction due to unspecified occlusion or stenosis of unspecified cerebral artery: Secondary | ICD-10-CM | POA: Diagnosis not present

## 2017-01-17 DIAGNOSIS — G4733 Obstructive sleep apnea (adult) (pediatric): Secondary | ICD-10-CM

## 2017-01-17 DIAGNOSIS — Z9989 Dependence on other enabling machines and devices: Secondary | ICD-10-CM

## 2017-01-17 NOTE — Progress Notes (Signed)
Subjective:    Patient ID: James Schultz is a 66 y.o. male.  HPI    Interim history:  Mr. James Schultz is a 66 year old right-handed gentleman with an underlying complex medical history of Parkinson's disease diagnosed in 2007, depression, anxiety, lumbar spinal stenosis, cervical degenerative disc disease, kidney stone, osteoarthritis, s/p L TKA and s/p R hip surgery, who presents for followup consultation of his obstructive sleep apnea and right pontine stroke in August 2017. He is accompanied by his wife again today. I last saw him on 09/19/2016, at which time he reported doing okay, but was not able to provide his own history. He had left hip pain. His wife reported most of his history. He had a stroke in September 2017. She found him slumped over and nonresponsive. His blood pressure had been fluctuating. She did not call 911 but took him to the emergency room. He was seen at [facility] and was hospitalized for about a week. An actual stroke was not confirmed at the time as I understand. He had inpatient rehabilitation at [facility]. She reported that he had more hallucinations, delusions, unfortunately did not do very well during his rehabilitation stay. She reported that he had fallen out of his recliner recently and could not get up and they needed help to help him stand. He was taking pain medication as needed, certainly not daily. He had some left knee pain and left hip pain. He was quite good with his CPAP compliance in the month of January 2018.  He was seen in the interim by Dr. Nicki Reaper in February 2018. I reviewed the office note.   Today, 01/17/2017 (all dictated new, as well as above notes, some dictation done in note pad or Word, outside of chart, may appear as copied):   The patient's CPAP compliance data from 12/17/2016 through 01/15/2017 indicates poor compliance, average usage of 3 hours and 16 minutes, compliance percentage only 17% AHI 6.1 per hour, leak on the high  side. In the past 90 days his compliance was 46%, slightly better. Residual AHI 5.2 per hour, average usage of 5 hours and 22 minutes. His wife reports that he has a tendency to pull off the mask at night but many nights he does not even put his CPAP mask on. He was taken off of Azilect and Mirapex recently by Dr. Nicki Reaper. He has had more motor decline and also cognitive decline. His mobility is really restricted, he also has pain. No recent TIA like symptoms thankfully. Currently, he is on Sinemet IR 1 pill 5 times a day at 6, 9, 12, 3 PM and 6 PM and takes one CR at night around 9 PM.  Previously (copied from previous notes for reference):   I saw him on 05/16/2016, at which time he was noncompliant with his AutoPap machine. We also reviewed his hospitalizations. He was admitted to the hospital on 04/08/2016 secondary to new left facial droop noted on 04/08/2016. He presented to the emergency room. He was diagnosed with right pontine infarct, he had a head CT without contrast on 04/08/2016 which showed generalized atrophy, no acute infarct, but brain MRI from 04/09/2016 confirmed acute right pontine infarct. Brain MRA from 04/09/2016 showed no large vessel occlusion or high-grade stenosis. He was transferred to inpatient rehabilitation on 04/11/2016. He was discharged from rehabilitation on 04/20/2016. He was then in outpatient PT, OT and speech therapy. He was taken off of Lexapro and started a different antidepressant. He was on Plavix, Lipitor, and  blood pressure medication. He was on Sinemet 2 pills first thing in the morning and then 1-1/2 pills for the other 3 doses.    I saw him on 01/30/2015, at which time he reported left knee pain and back pain. He was taking Percocet and was seeing neurosurgery for back pain, s/p low back surgery years ago. He had left total knee replacement surgery under Dr. Maureen Ralphs in July 2014 and, in August 2014 he required hip surgery secondary to hip fracture. His wife  reported that his memory was worse. He had a fall at church about 2 weeks prior. Unfortunately, he missed a step or misjudged it. He had seen Dr. Nicki Reaper at [facility] in March 2016 and a Sinemet CR 25-100 milligrams strength was added for his fourth dose. He was taking Sinemet 3 times a day, at 6, 10, and 2 PM. He was taking the CR around 6 PM. He does not feel it has made much of a difference. Some time ago, he talked with Dr. Nicki Reaper about DBS surgery and was advised against it at the time. We discussed DBS evaluation again. I asked him to take Sinemet 4 times a day and Sinemet CR 50-200 milligrams strength around 9 PM. I last saw him on 05/16/2016, at which time he was poorly compliant with his AutoPap machine. Unfortunately, he was not doing very well in the prior months, his mobility has declined, affected also by arthritis and left hip pain. He had developed left facial droop in late August 2017 and was found to have a right pontine infarct. He was in rehabilitation until early September 2017. He was in an outpatient therapies. He was taking Sinemet 2 pills in the morning and 1-1/2 pills for the other 3 doses for the day. He was placed on a different antidepressant in the hospital and Lexapro had been stopped. I suggested we change his AutoPap to CPAP of 12 cm. I increased his Sinemet to 2 pills 4 times a day. His trazodone had been stopped or he had run out and I suggested he stay off of it.   I reviewed his AutoPap compliance data from 05/02/2016 through 05/08/2016 which is only 7 days during which time he used his machine 7 days but percent used days greater than 4 hours was 29% only. Residual AHI 18.2 per hour, pressure settings of 7 cm to 18 cm. We do have 90 day compliance data available from 02/02/2016 through 05/01/2016, during which time his compliance was 32%, residual AHI of 14.4, this was on CPAP of 9 cm.  I saw him on 09/17/14, at which time he reported feeling well with CPAP, but had sleep disturbance  due to nocturia and urinary incontinence at night. He did endorse sleeping better with CPAP. His Parkinson's for the most part was stable but he had more freezing and more tremors. I did not change his medications for Parkinson's disease. I asked him to continue using CPAP regularly.   I reviewed his CPAP compliance data from 12/24/2014 through 01/22/2015 which is a total of 30 days during which time he used his machine 28 days with percent used days greater than 4 hours at 70%, indicating adequate compliance with an average usage for all days of 5 hours and 3 minutes, residual AHI at 4 per hour, leak acceptable with the 95th percentile at 11.5 L/m on a pressure of 9 cm with EPR of 3.   I saw him on 05/02/2014, at which time he reported that he initially  did not know how to use the CPAP machine but after he changed to another DME provider and received another machine, he was using it well. He felt improved with his sleep. He was compliant with treatment. He reported chronic back pain and right hip pain. He also reported foot pain and some depressive symptoms. He was tried on Wellbutrin in the past but felt worse.     He used to see Dr. Elspeth Cho for his Parkinson's disease and was last seen by him on 01/03/2014, at which time he was taking Sinemet 4 times a day, starting at 7 AM every 4 hours. He was also on Mirapex 1 mg strength one pill 3 times a day and Azilect 1 mg once daily. He had mild wearing off symptoms of motor fluctuations. He had developed kidney stones which were treated in April 2015. He was first seen by Dr. Hosie Poisson and January 2015 and prior to that he used to see Dr. Avie Echevaria and was last seen by him in March 2013 at which time they discussed potential DBS treatment. He was instructed to follow-up with Dr. Lorin Picket at [facility] for further evaluation. He had participated in a Parkinson's disease study at [facility] for 5 years which she finished in 2014.    I reviewed his compliance data from  08/16/2014 through 09/14/2014 which is a total of 30 days during which time he used his CPAP every day except for 2 days with percent used days greater than 4 hours of only 50%, indicating suboptimal compliance with an average usage of 3 hours and 41 minutes, residual AHI acceptable at 4.9 per hour and leak acceptable with the 95th percentile at 12.5 L/m. Pressure at 9 cm with EPR of 3.   I first met him on 01/20/2014 at the request of Dr. Hosie Poisson, at which time the patient reported that he was diagnosed with severe obstructive sleep apnea some 10 years ago. He reported being on CPAP but having difficulty tolerating CPAP. He reported cognitive decline as well as daytime somnolence and admitted to not be fully compliant with CPAP therapy. He reported panic attacks when he first put the mask on. He admitted not to have used CPAP in over a year. I suggested he return for a sleep study. 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 He was taking Benadryl 50 mg each night. He had been doing this for years. He had a split-night sleep study on 01/24/2014 and went over his test results with him in detail today. Baseline sleep efficiency was 92.9% with a latency to sleep of 2.5 minutes and wake after sleep onset of 4 minutes with no significant fragmentation noted. He had a normal arousal index. He had an increased percentage of stage II sleep, absence of deep sleep and absence of REM sleep. He had PACs and PVCs on EKG and no EEG changes. He had no significant PLMS. His total AHI was highly elevated at 45.2 events per hour. Baseline oxygen saturation was 93% with a nadir of 88%. He was titrated on CPAP during the rest of the test. Sleep efficiency was 90.6%. Arousal index was normal. He had an increased percentage of stage II sleep, absence of deep sleep and a normal percentage of REM sleep. Average oxygen saturation was 93% with a nadir of 88%. He had no significant periodic leg movements. He was titrated from 5-8  cm. He had some central apneas on the final pressure of 8 cm. Based on the overall test results  I prescribed CPAP at a pressure of 8 cm using large nasal pillows.   I reviewed the patient's CPAP compliance data from 03/19/2014 to 04/17/2014, which is a total of 30 days, during which time the patient used CPAP every day. The average usage for all days was 6 hours and 24 minutes. The percent used days greater than 4 hours was 90 %, indicating excellent compliance. The residual AHI was 6 per hour, indicating a slightly suboptimal treatment pressure of 8 cwp with EPR of 3. Air leak from the mask was low at 7 L per minute at the 95th percentile. He may need an increase in his pressure, I felt.  His Past Medical History Is Significant For: Past Medical History:  Diagnosis Date  . Anxiety   . Cervical spondylosis   . Degenerative disc disease, cervical   . Depression   . Headache(784.0)   . Hypertension   . OA (osteoarthritis)   . OSA (obstructive sleep apnea)    SEVERE OSA PER STUDY 2005--  USES NASAL CANNULA  WITH SETTING AT 12 -- NO MASK  . Parkinson's disease (HCC) NEUROLOGIST--  DR Elspeth Cho   IN A STUDY AT [facility]--- lov note crae everywhere 02-19-2013 dr Azucena Cecil scott  . Right ureteral stone   . Spinal stenosis of lumbar region   . Urinary incontinence    SECONDARY TO PARKINSON'S DISEASE  . Wears glasses     His Past Surgical History Is Significant For: Past Surgical History:  Procedure Laterality Date  . CHOLECYSTECTOMY  2000  . CYSTOSCOPY WITH RETROGRADE PYELOGRAM, URETEROSCOPY AND STENT PLACEMENT Right 11/13/2013   Procedure: CYSTOSCOPY WITH RIGHT URETEROSCOPY, Right Retrograde Pyelogram, RIGHT URETERAL STENT PLACEMENT, Basket Stone Extraction;  Surgeon: Crist Fat, MD;  Location: [facility];  Service: Urology;  Laterality: Right;  . HIP PINNING,CANNULATED Right 04/13/2013   Procedure: CANNULATED HIP PINNING;  Surgeon: Javier Docker, MD;  Location: [facility];   Service: Orthopedics;  Laterality: Right;  PERCUTANEOUS CANNULATED RIGHT HIP PINNING   . HOLMIUM LASER APPLICATION Right 11/13/2013   Procedure: RIGHT LASER LITHOTRIPSY;  Surgeon: Crist Fat, MD;  Location: [facility];  Service: Urology;  Laterality: Right;  . KNEE ARTHROSCOPY W/ DEBRIDEMENT Bilateral RIGHT  04-07-2008/   LEFT 10-13-2005  . LUMBAR LAMINECTOMY  01-05-2010   L2  --  L5  . TOTAL KNEE ARTHROPLASTY Left 03/04/2013   Procedure: LEFT TOTAL KNEE ARTHROPLASTY;  Surgeon: Loanne Drilling, MD;  Location: [facility];  Service: Orthopedics;  Laterality: Left;    His Family History Is Significant For: Family History  Problem Relation Age of Onset  . Cancer Mother   . Parkinson's disease Unknown        H/O  . Alzheimer's disease Unknown        H/O  . ALS Unknown        H/O    His Social History Is Significant For: Social History   Social History  . Marital status: Married    Spouse name: [place]  . Number of children: 1  . Years of education: Bachelor's   Occupational History  . RETIRED Not Employed   Social History Main Topics  . Smoking status: Never Smoker  . Smokeless tobacco: Never Used  . Alcohol use No  . Drug use: No  . Sexual activity: Not on file   Other Topics Concern  . Not on file   Social History Narrative   Patient is married and lives at  home with his wife ([place]), has 1 child   Patient is right handed   Education level is Bachelor's   Caffeine consumption is 3 cups daily    His Allergies Are:  No Known Allergies:   His Current Medications Are:  Outpatient Encounter Prescriptions as of 01/17/2017  Medication Sig  . aspirin 81 MG chewable tablet Chew by mouth daily.  Marland Kitchen atorvastatin (LIPITOR) 20 MG tablet Take 1 tablet (20 mg total) by mouth daily at 6 PM.  . carbidopa-levodopa (SINEMET CR) 50-200 MG per tablet Take 1 tablet by mouth at bedtime.  . carbidopa-levodopa (SINEMET IR) 25-100 MG tablet Take 2 tablets by mouth 4  (four) times daily. Take at 6 am, 10 am, 2 pm and 6 pm  . Cholecalciferol (VITAMIN D3) 1000 units CAPS Take by mouth.  . clopidogrel (PLAVIX) 75 MG tablet Take 1 tablet (75 mg total) by mouth daily.  Marland Kitchen lisinopril (PRINIVIL,ZESTRIL) 10 MG tablet Take 1 tablet (10 mg total) by mouth daily.  . Multiple Vitamin (MULTIVITAMIN) tablet Take 1 tablet by mouth daily.  Marland Kitchen oxyCODONE-acetaminophen (PERCOCET) 10-325 MG tablet Take 1 tablet by mouth every 6 (six) hours as needed.  . sertraline (ZOLOFT) 100 MG tablet Take 1.5 tablets by mouth daily.   . tamsulosin (FLOMAX) 0.4 MG CAPS capsule Take 1 capsule (0.4 mg total) by mouth daily.  . traZODone (DESYREL) 50 MG tablet Take 1 tablet (50 mg total) by mouth at bedtime.  . [DISCONTINUED] pramipexole (MIRAPEX) 1 MG tablet Take 0.5 tablets (0.5 mg total) by mouth 3 (three) times daily.  . [DISCONTINUED] rasagiline (AZILECT) 1 MG TABS tablet Take 1 tablet (1 mg total) by mouth every morning.   No facility-administered encounter medications on file as of 01/17/2017.   :  Review of Systems:  Out of a complete 14 point review of systems, all are reviewed and negative with the exception of these symptoms as listed below: Review of Systems  Neurological:       Pt presents today to discuss his PD. Dr. Jennelle Human at [facility] took pt off of mirapex and azilect "because of his movement." Pt takes off his cpap during the night.    Objective:  Neurologic Exam  Physical Exam Physical Examination:   Vitals:   01/17/17 1141  BP: 120/78  Pulse: 83   General Examination: The patient is a very pleasant 66 y.o. male in no acute distress. He appears More frail and deconditioned. He is rather quiet. He is situated in his wheelchair.  HEENT: Normocephalic, atraumatic, pupils are equal, round and reactive to light and accommodation. Extraocular tracking is moderately impaired. He has no nystagmus. He has limitation to upper gaze. He has decreased eye blink rate, moderate to  severe facial masking, oropharynx exam reveals mild to moderate mouth dryness, moderately crowded airway. Mallampati is class III. Tongue protrudes centrally and palate elevates symmetrically. He has no significant drooling, speech is moderately hypophonic, not dysarthric.   Chest: Clear to auscultation without wheezing, rhonchi or crackles noted.  Heart: S1+S2+0, regular and normal without murmurs, rubs or gallops noted.   Abdomen: Soft, non-tender and non-distended with normal bowel sounds appreciated on auscultation.  Extremities: There is trace to 1+ edema in the distal lower extremities, left more than right.  Skin: Warm and dry without trophic changes noted.  Musculoskeletal: exam reveals left hip pain, limitation in range of motion in the left hip and left knee, with pain reported in both those areas. He has mild decrease in  range of motion in the right hip, he is status post right hip surgery, these findings appear to be stable.   Neurologically:  Mental status: The patient is awake, alert and oriented in all 4 spheres. His immediate and remote memory, attention, language skills and fund of knowledge are  impaired. He is minimally verbal today. He is more slower in thinking and in responding. His wife reports most of his history and answers most of the questions.   On 01/30/2015: MMSE: 28/30, CDT: 3/4, AFT: 11/min.   Motor exam: Normal bulk, and global strength of 4+ out of 5. He has difficulty with mobility in the lower extremities. Fine motor skills of moderate to severely impaired bilaterally, some lateralization noted on the right. Romberg is not testable safely.  Sensory exam is intact to light touch throughout. No dysmetria or intention tremor is noted. Gait, station and balance: he is unable to stand safely today.   Assessment and Plan:    In summary, DEVARIUS NELLES is a very pleasant 66 year old male with an underlying complex medical history of Parkinson's  disease (diagnosed in 2007), depression, anxiety, lumbar spinal stenosis, degenerative disc disease, kidney stone, osteoarthritis, status post left total knee replacement, status post right hip surgery, obesity, sleep apnea, stroke, TIA, chronic pain, and OSA, who presents for follow-up consultation of his obstructive sleep apnea. He was more compliant with his CPAP therapy at her last visit, more recently, he has not been using his CPAP regularly, particularly this past month. He is encouraged to continue to be fully compliant with CPAP due to his underlying complex medical history. For his Parkinson's disease he has been followed by Dr. Nicki Reaper. He is advised to continue the current medication regimen he is on. Sometimes as Parkinson's disease progresses I explained to his wife and the patient we have to streamline the medication regimen because of potential side effects. He has had more physical and cognitive decline over time. Unfortunately, he is quite impaired because of his arthritis and orthopedic issues. I suggested he try a different full face mask and I prescribed this for him today. I suggested a six-month checkup. I answered all their questions today and the patient and his wife were in agreement. I spent 20 minutes in total face-to-face time with the patient, more than 50% of which was spent in counseling and coordination of care, reviewing test results, reviewing medication and discussing or reviewing the diagnosis of OSA, its prognosis and treatment options. Pertinent laboratory and imaging test results that were available during this visit with the patient were reviewed by me and considered in my medical decision making (see chart for details).

## 2017-01-17 NOTE — Patient Instructions (Signed)
For your PD, you are encouraged to keep your FU with Dr. Nicki Reaper as planned.  For your CPAP, we will try a new full face mask.

## 2017-01-19 DIAGNOSIS — G20 Parkinson's disease: Secondary | ICD-10-CM | POA: Diagnosis not present

## 2017-01-19 DIAGNOSIS — I10 Essential (primary) hypertension: Secondary | ICD-10-CM | POA: Diagnosis not present

## 2017-01-19 DIAGNOSIS — M6281 Muscle weakness (generalized): Secondary | ICD-10-CM | POA: Diagnosis not present

## 2017-01-19 DIAGNOSIS — G8929 Other chronic pain: Secondary | ICD-10-CM | POA: Diagnosis not present

## 2017-01-19 DIAGNOSIS — F0280 Dementia in other diseases classified elsewhere without behavioral disturbance: Secondary | ICD-10-CM | POA: Diagnosis not present

## 2017-01-19 DIAGNOSIS — M1990 Unspecified osteoarthritis, unspecified site: Secondary | ICD-10-CM | POA: Diagnosis not present

## 2017-01-20 DIAGNOSIS — M1990 Unspecified osteoarthritis, unspecified site: Secondary | ICD-10-CM | POA: Diagnosis not present

## 2017-01-20 DIAGNOSIS — F0280 Dementia in other diseases classified elsewhere without behavioral disturbance: Secondary | ICD-10-CM | POA: Diagnosis not present

## 2017-01-20 DIAGNOSIS — M6281 Muscle weakness (generalized): Secondary | ICD-10-CM | POA: Diagnosis not present

## 2017-01-20 DIAGNOSIS — G20 Parkinson's disease: Secondary | ICD-10-CM | POA: Diagnosis not present

## 2017-01-20 DIAGNOSIS — G8929 Other chronic pain: Secondary | ICD-10-CM | POA: Diagnosis not present

## 2017-01-20 DIAGNOSIS — I10 Essential (primary) hypertension: Secondary | ICD-10-CM | POA: Diagnosis not present

## 2017-01-23 DIAGNOSIS — M1990 Unspecified osteoarthritis, unspecified site: Secondary | ICD-10-CM | POA: Diagnosis not present

## 2017-01-23 DIAGNOSIS — G20 Parkinson's disease: Secondary | ICD-10-CM | POA: Diagnosis not present

## 2017-01-23 DIAGNOSIS — F0280 Dementia in other diseases classified elsewhere without behavioral disturbance: Secondary | ICD-10-CM | POA: Diagnosis not present

## 2017-01-23 DIAGNOSIS — G8929 Other chronic pain: Secondary | ICD-10-CM | POA: Diagnosis not present

## 2017-01-23 DIAGNOSIS — I10 Essential (primary) hypertension: Secondary | ICD-10-CM | POA: Diagnosis not present

## 2017-01-23 DIAGNOSIS — M6281 Muscle weakness (generalized): Secondary | ICD-10-CM | POA: Diagnosis not present

## 2017-01-25 DIAGNOSIS — G8929 Other chronic pain: Secondary | ICD-10-CM | POA: Diagnosis not present

## 2017-01-25 DIAGNOSIS — I10 Essential (primary) hypertension: Secondary | ICD-10-CM | POA: Diagnosis not present

## 2017-01-25 DIAGNOSIS — F0280 Dementia in other diseases classified elsewhere without behavioral disturbance: Secondary | ICD-10-CM | POA: Diagnosis not present

## 2017-01-25 DIAGNOSIS — M6281 Muscle weakness (generalized): Secondary | ICD-10-CM | POA: Diagnosis not present

## 2017-01-25 DIAGNOSIS — G20 Parkinson's disease: Secondary | ICD-10-CM | POA: Diagnosis not present

## 2017-01-25 DIAGNOSIS — M1990 Unspecified osteoarthritis, unspecified site: Secondary | ICD-10-CM | POA: Diagnosis not present

## 2017-01-26 DIAGNOSIS — F0280 Dementia in other diseases classified elsewhere without behavioral disturbance: Secondary | ICD-10-CM | POA: Diagnosis not present

## 2017-01-26 DIAGNOSIS — M1990 Unspecified osteoarthritis, unspecified site: Secondary | ICD-10-CM | POA: Diagnosis not present

## 2017-01-26 DIAGNOSIS — G8929 Other chronic pain: Secondary | ICD-10-CM | POA: Diagnosis not present

## 2017-01-26 DIAGNOSIS — M6281 Muscle weakness (generalized): Secondary | ICD-10-CM | POA: Diagnosis not present

## 2017-01-26 DIAGNOSIS — I10 Essential (primary) hypertension: Secondary | ICD-10-CM | POA: Diagnosis not present

## 2017-01-26 DIAGNOSIS — G20 Parkinson's disease: Secondary | ICD-10-CM | POA: Diagnosis not present

## 2017-01-31 DIAGNOSIS — F0280 Dementia in other diseases classified elsewhere without behavioral disturbance: Secondary | ICD-10-CM | POA: Diagnosis not present

## 2017-01-31 DIAGNOSIS — I10 Essential (primary) hypertension: Secondary | ICD-10-CM | POA: Diagnosis not present

## 2017-01-31 DIAGNOSIS — G8929 Other chronic pain: Secondary | ICD-10-CM | POA: Diagnosis not present

## 2017-01-31 DIAGNOSIS — G20 Parkinson's disease: Secondary | ICD-10-CM | POA: Diagnosis not present

## 2017-01-31 DIAGNOSIS — M6281 Muscle weakness (generalized): Secondary | ICD-10-CM | POA: Diagnosis not present

## 2017-01-31 DIAGNOSIS — M1990 Unspecified osteoarthritis, unspecified site: Secondary | ICD-10-CM | POA: Diagnosis not present

## 2017-02-01 DIAGNOSIS — G20 Parkinson's disease: Secondary | ICD-10-CM | POA: Diagnosis not present

## 2017-02-01 DIAGNOSIS — F0280 Dementia in other diseases classified elsewhere without behavioral disturbance: Secondary | ICD-10-CM | POA: Diagnosis not present

## 2017-02-01 DIAGNOSIS — I10 Essential (primary) hypertension: Secondary | ICD-10-CM | POA: Diagnosis not present

## 2017-02-01 DIAGNOSIS — G8929 Other chronic pain: Secondary | ICD-10-CM | POA: Diagnosis not present

## 2017-02-01 DIAGNOSIS — M1990 Unspecified osteoarthritis, unspecified site: Secondary | ICD-10-CM | POA: Diagnosis not present

## 2017-02-01 DIAGNOSIS — M6281 Muscle weakness (generalized): Secondary | ICD-10-CM | POA: Diagnosis not present

## 2017-02-02 DIAGNOSIS — I10 Essential (primary) hypertension: Secondary | ICD-10-CM | POA: Diagnosis not present

## 2017-02-02 DIAGNOSIS — M1990 Unspecified osteoarthritis, unspecified site: Secondary | ICD-10-CM | POA: Diagnosis not present

## 2017-02-02 DIAGNOSIS — G8929 Other chronic pain: Secondary | ICD-10-CM | POA: Diagnosis not present

## 2017-02-02 DIAGNOSIS — F0280 Dementia in other diseases classified elsewhere without behavioral disturbance: Secondary | ICD-10-CM | POA: Diagnosis not present

## 2017-02-02 DIAGNOSIS — M6281 Muscle weakness (generalized): Secondary | ICD-10-CM | POA: Diagnosis not present

## 2017-02-02 DIAGNOSIS — G20 Parkinson's disease: Secondary | ICD-10-CM | POA: Diagnosis not present

## 2017-02-03 DIAGNOSIS — I10 Essential (primary) hypertension: Secondary | ICD-10-CM | POA: Diagnosis not present

## 2017-02-03 DIAGNOSIS — M6281 Muscle weakness (generalized): Secondary | ICD-10-CM | POA: Diagnosis not present

## 2017-02-03 DIAGNOSIS — L89321 Pressure ulcer of left buttock, stage 1: Secondary | ICD-10-CM | POA: Diagnosis not present

## 2017-02-03 DIAGNOSIS — F0280 Dementia in other diseases classified elsewhere without behavioral disturbance: Secondary | ICD-10-CM | POA: Diagnosis not present

## 2017-02-03 DIAGNOSIS — G20 Parkinson's disease: Secondary | ICD-10-CM | POA: Diagnosis not present

## 2017-02-03 DIAGNOSIS — G8929 Other chronic pain: Secondary | ICD-10-CM | POA: Diagnosis not present

## 2017-02-04 DIAGNOSIS — G8929 Other chronic pain: Secondary | ICD-10-CM | POA: Diagnosis not present

## 2017-02-04 DIAGNOSIS — I10 Essential (primary) hypertension: Secondary | ICD-10-CM | POA: Diagnosis not present

## 2017-02-04 DIAGNOSIS — M6281 Muscle weakness (generalized): Secondary | ICD-10-CM | POA: Diagnosis not present

## 2017-02-04 DIAGNOSIS — F0280 Dementia in other diseases classified elsewhere without behavioral disturbance: Secondary | ICD-10-CM | POA: Diagnosis not present

## 2017-02-04 DIAGNOSIS — L89321 Pressure ulcer of left buttock, stage 1: Secondary | ICD-10-CM | POA: Diagnosis not present

## 2017-02-04 DIAGNOSIS — G20 Parkinson's disease: Secondary | ICD-10-CM | POA: Diagnosis not present

## 2017-02-06 DIAGNOSIS — F0280 Dementia in other diseases classified elsewhere without behavioral disturbance: Secondary | ICD-10-CM | POA: Diagnosis not present

## 2017-02-06 DIAGNOSIS — M6281 Muscle weakness (generalized): Secondary | ICD-10-CM | POA: Diagnosis not present

## 2017-02-06 DIAGNOSIS — G8929 Other chronic pain: Secondary | ICD-10-CM | POA: Diagnosis not present

## 2017-02-06 DIAGNOSIS — I10 Essential (primary) hypertension: Secondary | ICD-10-CM | POA: Diagnosis not present

## 2017-02-06 DIAGNOSIS — L89321 Pressure ulcer of left buttock, stage 1: Secondary | ICD-10-CM | POA: Diagnosis not present

## 2017-02-06 DIAGNOSIS — G20 Parkinson's disease: Secondary | ICD-10-CM | POA: Diagnosis not present

## 2017-02-07 DIAGNOSIS — G20 Parkinson's disease: Secondary | ICD-10-CM | POA: Diagnosis not present

## 2017-02-07 DIAGNOSIS — G8929 Other chronic pain: Secondary | ICD-10-CM | POA: Diagnosis not present

## 2017-02-07 DIAGNOSIS — M6281 Muscle weakness (generalized): Secondary | ICD-10-CM | POA: Diagnosis not present

## 2017-02-07 DIAGNOSIS — F0280 Dementia in other diseases classified elsewhere without behavioral disturbance: Secondary | ICD-10-CM | POA: Diagnosis not present

## 2017-02-07 DIAGNOSIS — I10 Essential (primary) hypertension: Secondary | ICD-10-CM | POA: Diagnosis not present

## 2017-02-07 DIAGNOSIS — L89321 Pressure ulcer of left buttock, stage 1: Secondary | ICD-10-CM | POA: Diagnosis not present

## 2017-02-09 DIAGNOSIS — M6281 Muscle weakness (generalized): Secondary | ICD-10-CM | POA: Diagnosis not present

## 2017-02-09 DIAGNOSIS — I10 Essential (primary) hypertension: Secondary | ICD-10-CM | POA: Diagnosis not present

## 2017-02-09 DIAGNOSIS — L89321 Pressure ulcer of left buttock, stage 1: Secondary | ICD-10-CM | POA: Diagnosis not present

## 2017-02-09 DIAGNOSIS — F0280 Dementia in other diseases classified elsewhere without behavioral disturbance: Secondary | ICD-10-CM | POA: Diagnosis not present

## 2017-02-09 DIAGNOSIS — G20 Parkinson's disease: Secondary | ICD-10-CM | POA: Diagnosis not present

## 2017-02-09 DIAGNOSIS — G8929 Other chronic pain: Secondary | ICD-10-CM | POA: Diagnosis not present

## 2017-02-10 DIAGNOSIS — M6281 Muscle weakness (generalized): Secondary | ICD-10-CM | POA: Diagnosis not present

## 2017-02-10 DIAGNOSIS — I10 Essential (primary) hypertension: Secondary | ICD-10-CM | POA: Diagnosis not present

## 2017-02-10 DIAGNOSIS — F0280 Dementia in other diseases classified elsewhere without behavioral disturbance: Secondary | ICD-10-CM | POA: Diagnosis not present

## 2017-02-10 DIAGNOSIS — G20 Parkinson's disease: Secondary | ICD-10-CM | POA: Diagnosis not present

## 2017-02-10 DIAGNOSIS — G8929 Other chronic pain: Secondary | ICD-10-CM | POA: Diagnosis not present

## 2017-02-10 DIAGNOSIS — L89321 Pressure ulcer of left buttock, stage 1: Secondary | ICD-10-CM | POA: Diagnosis not present

## 2017-02-13 DIAGNOSIS — M6281 Muscle weakness (generalized): Secondary | ICD-10-CM | POA: Diagnosis not present

## 2017-02-13 DIAGNOSIS — G8929 Other chronic pain: Secondary | ICD-10-CM | POA: Diagnosis not present

## 2017-02-13 DIAGNOSIS — G20 Parkinson's disease: Secondary | ICD-10-CM | POA: Diagnosis not present

## 2017-02-13 DIAGNOSIS — I10 Essential (primary) hypertension: Secondary | ICD-10-CM | POA: Diagnosis not present

## 2017-02-13 DIAGNOSIS — L89321 Pressure ulcer of left buttock, stage 1: Secondary | ICD-10-CM | POA: Diagnosis not present

## 2017-02-13 DIAGNOSIS — F0280 Dementia in other diseases classified elsewhere without behavioral disturbance: Secondary | ICD-10-CM | POA: Diagnosis not present

## 2017-02-14 DIAGNOSIS — Z7902 Long term (current) use of antithrombotics/antiplatelets: Secondary | ICD-10-CM | POA: Diagnosis not present

## 2017-02-14 DIAGNOSIS — S40011A Contusion of right shoulder, initial encounter: Secondary | ICD-10-CM | POA: Diagnosis not present

## 2017-02-14 DIAGNOSIS — I10 Essential (primary) hypertension: Secondary | ICD-10-CM | POA: Diagnosis not present

## 2017-02-14 DIAGNOSIS — F329 Major depressive disorder, single episode, unspecified: Secondary | ICD-10-CM | POA: Diagnosis not present

## 2017-02-14 DIAGNOSIS — M6281 Muscle weakness (generalized): Secondary | ICD-10-CM | POA: Diagnosis not present

## 2017-02-14 DIAGNOSIS — R531 Weakness: Secondary | ICD-10-CM | POA: Diagnosis not present

## 2017-02-14 DIAGNOSIS — G20 Parkinson's disease: Secondary | ICD-10-CM | POA: Diagnosis not present

## 2017-02-14 DIAGNOSIS — L89321 Pressure ulcer of left buttock, stage 1: Secondary | ICD-10-CM | POA: Diagnosis not present

## 2017-02-14 DIAGNOSIS — S0003XA Contusion of scalp, initial encounter: Secondary | ICD-10-CM | POA: Diagnosis not present

## 2017-02-14 DIAGNOSIS — F0280 Dementia in other diseases classified elsewhere without behavioral disturbance: Secondary | ICD-10-CM | POA: Diagnosis not present

## 2017-02-14 DIAGNOSIS — W102XXA Fall (on)(from) incline, initial encounter: Secondary | ICD-10-CM | POA: Diagnosis not present

## 2017-02-14 DIAGNOSIS — Z96641 Presence of right artificial hip joint: Secondary | ICD-10-CM | POA: Diagnosis not present

## 2017-02-14 DIAGNOSIS — S7001XA Contusion of right hip, initial encounter: Secondary | ICD-10-CM | POA: Diagnosis not present

## 2017-02-14 DIAGNOSIS — M25512 Pain in left shoulder: Secondary | ICD-10-CM | POA: Diagnosis not present

## 2017-02-14 DIAGNOSIS — Z79899 Other long term (current) drug therapy: Secondary | ICD-10-CM | POA: Diagnosis not present

## 2017-02-14 DIAGNOSIS — R404 Transient alteration of awareness: Secondary | ICD-10-CM | POA: Diagnosis not present

## 2017-02-14 DIAGNOSIS — Z8673 Personal history of transient ischemic attack (TIA), and cerebral infarction without residual deficits: Secondary | ICD-10-CM | POA: Diagnosis not present

## 2017-02-14 DIAGNOSIS — Z7982 Long term (current) use of aspirin: Secondary | ICD-10-CM | POA: Diagnosis not present

## 2017-02-14 DIAGNOSIS — Z96652 Presence of left artificial knee joint: Secondary | ICD-10-CM | POA: Diagnosis not present

## 2017-02-14 DIAGNOSIS — G8929 Other chronic pain: Secondary | ICD-10-CM | POA: Diagnosis not present

## 2017-02-16 DIAGNOSIS — F0280 Dementia in other diseases classified elsewhere without behavioral disturbance: Secondary | ICD-10-CM | POA: Diagnosis not present

## 2017-02-16 DIAGNOSIS — L89321 Pressure ulcer of left buttock, stage 1: Secondary | ICD-10-CM | POA: Diagnosis not present

## 2017-02-16 DIAGNOSIS — G8929 Other chronic pain: Secondary | ICD-10-CM | POA: Diagnosis not present

## 2017-02-16 DIAGNOSIS — I10 Essential (primary) hypertension: Secondary | ICD-10-CM | POA: Diagnosis not present

## 2017-02-16 DIAGNOSIS — G20 Parkinson's disease: Secondary | ICD-10-CM | POA: Diagnosis not present

## 2017-02-16 DIAGNOSIS — M6281 Muscle weakness (generalized): Secondary | ICD-10-CM | POA: Diagnosis not present

## 2017-02-17 DIAGNOSIS — G20 Parkinson's disease: Secondary | ICD-10-CM | POA: Diagnosis not present

## 2017-02-17 DIAGNOSIS — M6281 Muscle weakness (generalized): Secondary | ICD-10-CM | POA: Diagnosis not present

## 2017-02-17 DIAGNOSIS — L89321 Pressure ulcer of left buttock, stage 1: Secondary | ICD-10-CM | POA: Diagnosis not present

## 2017-02-17 DIAGNOSIS — G8929 Other chronic pain: Secondary | ICD-10-CM | POA: Diagnosis not present

## 2017-02-17 DIAGNOSIS — F0280 Dementia in other diseases classified elsewhere without behavioral disturbance: Secondary | ICD-10-CM | POA: Diagnosis not present

## 2017-02-17 DIAGNOSIS — I10 Essential (primary) hypertension: Secondary | ICD-10-CM | POA: Diagnosis not present

## 2017-02-18 DIAGNOSIS — F0280 Dementia in other diseases classified elsewhere without behavioral disturbance: Secondary | ICD-10-CM | POA: Diagnosis not present

## 2017-02-18 DIAGNOSIS — G20 Parkinson's disease: Secondary | ICD-10-CM | POA: Diagnosis not present

## 2017-02-18 DIAGNOSIS — L89321 Pressure ulcer of left buttock, stage 1: Secondary | ICD-10-CM | POA: Diagnosis not present

## 2017-02-18 DIAGNOSIS — G8929 Other chronic pain: Secondary | ICD-10-CM | POA: Diagnosis not present

## 2017-02-18 DIAGNOSIS — M6281 Muscle weakness (generalized): Secondary | ICD-10-CM | POA: Diagnosis not present

## 2017-02-18 DIAGNOSIS — I10 Essential (primary) hypertension: Secondary | ICD-10-CM | POA: Diagnosis not present

## 2017-02-20 DIAGNOSIS — G20 Parkinson's disease: Secondary | ICD-10-CM | POA: Diagnosis not present

## 2017-02-21 DIAGNOSIS — G20 Parkinson's disease: Secondary | ICD-10-CM | POA: Diagnosis not present

## 2017-02-21 DIAGNOSIS — M6281 Muscle weakness (generalized): Secondary | ICD-10-CM | POA: Diagnosis not present

## 2017-02-21 DIAGNOSIS — I10 Essential (primary) hypertension: Secondary | ICD-10-CM | POA: Diagnosis not present

## 2017-02-21 DIAGNOSIS — L89321 Pressure ulcer of left buttock, stage 1: Secondary | ICD-10-CM | POA: Diagnosis not present

## 2017-02-21 DIAGNOSIS — F0280 Dementia in other diseases classified elsewhere without behavioral disturbance: Secondary | ICD-10-CM | POA: Diagnosis not present

## 2017-02-21 DIAGNOSIS — G8929 Other chronic pain: Secondary | ICD-10-CM | POA: Diagnosis not present

## 2017-02-22 DIAGNOSIS — F0280 Dementia in other diseases classified elsewhere without behavioral disturbance: Secondary | ICD-10-CM | POA: Diagnosis not present

## 2017-02-22 DIAGNOSIS — L89321 Pressure ulcer of left buttock, stage 1: Secondary | ICD-10-CM | POA: Diagnosis not present

## 2017-02-22 DIAGNOSIS — G20 Parkinson's disease: Secondary | ICD-10-CM | POA: Diagnosis not present

## 2017-02-22 DIAGNOSIS — M6281 Muscle weakness (generalized): Secondary | ICD-10-CM | POA: Diagnosis not present

## 2017-02-22 DIAGNOSIS — G8929 Other chronic pain: Secondary | ICD-10-CM | POA: Diagnosis not present

## 2017-02-22 DIAGNOSIS — I10 Essential (primary) hypertension: Secondary | ICD-10-CM | POA: Diagnosis not present

## 2017-02-23 DIAGNOSIS — I10 Essential (primary) hypertension: Secondary | ICD-10-CM | POA: Diagnosis not present

## 2017-02-23 DIAGNOSIS — F0280 Dementia in other diseases classified elsewhere without behavioral disturbance: Secondary | ICD-10-CM | POA: Diagnosis not present

## 2017-02-23 DIAGNOSIS — G4733 Obstructive sleep apnea (adult) (pediatric): Secondary | ICD-10-CM | POA: Diagnosis not present

## 2017-02-23 DIAGNOSIS — L89321 Pressure ulcer of left buttock, stage 1: Secondary | ICD-10-CM | POA: Diagnosis not present

## 2017-02-23 DIAGNOSIS — F321 Major depressive disorder, single episode, moderate: Secondary | ICD-10-CM | POA: Diagnosis not present

## 2017-02-23 DIAGNOSIS — M6281 Muscle weakness (generalized): Secondary | ICD-10-CM | POA: Diagnosis not present

## 2017-02-23 DIAGNOSIS — I6350 Cerebral infarction due to unspecified occlusion or stenosis of unspecified cerebral artery: Secondary | ICD-10-CM | POA: Diagnosis not present

## 2017-02-23 DIAGNOSIS — G3183 Dementia with Lewy bodies: Secondary | ICD-10-CM | POA: Diagnosis not present

## 2017-02-23 DIAGNOSIS — Z299 Encounter for prophylactic measures, unspecified: Secondary | ICD-10-CM | POA: Diagnosis not present

## 2017-02-23 DIAGNOSIS — G20 Parkinson's disease: Secondary | ICD-10-CM | POA: Diagnosis not present

## 2017-02-23 DIAGNOSIS — G8929 Other chronic pain: Secondary | ICD-10-CM | POA: Diagnosis not present

## 2017-02-23 DIAGNOSIS — D490 Neoplasm of unspecified behavior of digestive system: Secondary | ICD-10-CM | POA: Diagnosis not present

## 2017-02-24 DIAGNOSIS — F0280 Dementia in other diseases classified elsewhere without behavioral disturbance: Secondary | ICD-10-CM | POA: Diagnosis not present

## 2017-02-24 DIAGNOSIS — I10 Essential (primary) hypertension: Secondary | ICD-10-CM | POA: Diagnosis not present

## 2017-02-24 DIAGNOSIS — L89321 Pressure ulcer of left buttock, stage 1: Secondary | ICD-10-CM | POA: Diagnosis not present

## 2017-02-24 DIAGNOSIS — G8929 Other chronic pain: Secondary | ICD-10-CM | POA: Diagnosis not present

## 2017-02-24 DIAGNOSIS — G20 Parkinson's disease: Secondary | ICD-10-CM | POA: Diagnosis not present

## 2017-02-24 DIAGNOSIS — M6281 Muscle weakness (generalized): Secondary | ICD-10-CM | POA: Diagnosis not present

## 2017-02-27 DIAGNOSIS — L89321 Pressure ulcer of left buttock, stage 1: Secondary | ICD-10-CM | POA: Diagnosis not present

## 2017-02-27 DIAGNOSIS — M6281 Muscle weakness (generalized): Secondary | ICD-10-CM | POA: Diagnosis not present

## 2017-02-27 DIAGNOSIS — G20 Parkinson's disease: Secondary | ICD-10-CM | POA: Diagnosis not present

## 2017-02-27 DIAGNOSIS — I10 Essential (primary) hypertension: Secondary | ICD-10-CM | POA: Diagnosis not present

## 2017-02-27 DIAGNOSIS — F0280 Dementia in other diseases classified elsewhere without behavioral disturbance: Secondary | ICD-10-CM | POA: Diagnosis not present

## 2017-02-27 DIAGNOSIS — G8929 Other chronic pain: Secondary | ICD-10-CM | POA: Diagnosis not present

## 2017-02-28 DIAGNOSIS — G20 Parkinson's disease: Secondary | ICD-10-CM | POA: Diagnosis not present

## 2017-02-28 DIAGNOSIS — F0280 Dementia in other diseases classified elsewhere without behavioral disturbance: Secondary | ICD-10-CM | POA: Diagnosis not present

## 2017-02-28 DIAGNOSIS — M6281 Muscle weakness (generalized): Secondary | ICD-10-CM | POA: Diagnosis not present

## 2017-02-28 DIAGNOSIS — G8929 Other chronic pain: Secondary | ICD-10-CM | POA: Diagnosis not present

## 2017-02-28 DIAGNOSIS — L89321 Pressure ulcer of left buttock, stage 1: Secondary | ICD-10-CM | POA: Diagnosis not present

## 2017-02-28 DIAGNOSIS — I10 Essential (primary) hypertension: Secondary | ICD-10-CM | POA: Diagnosis not present

## 2017-03-01 DIAGNOSIS — L89321 Pressure ulcer of left buttock, stage 1: Secondary | ICD-10-CM | POA: Diagnosis not present

## 2017-03-01 DIAGNOSIS — G8929 Other chronic pain: Secondary | ICD-10-CM | POA: Diagnosis not present

## 2017-03-01 DIAGNOSIS — I10 Essential (primary) hypertension: Secondary | ICD-10-CM | POA: Diagnosis not present

## 2017-03-01 DIAGNOSIS — F0280 Dementia in other diseases classified elsewhere without behavioral disturbance: Secondary | ICD-10-CM | POA: Diagnosis not present

## 2017-03-01 DIAGNOSIS — G20 Parkinson's disease: Secondary | ICD-10-CM | POA: Diagnosis not present

## 2017-03-01 DIAGNOSIS — M6281 Muscle weakness (generalized): Secondary | ICD-10-CM | POA: Diagnosis not present

## 2017-03-06 DIAGNOSIS — M6281 Muscle weakness (generalized): Secondary | ICD-10-CM | POA: Diagnosis not present

## 2017-03-06 DIAGNOSIS — G20 Parkinson's disease: Secondary | ICD-10-CM | POA: Diagnosis not present

## 2017-03-06 DIAGNOSIS — F0280 Dementia in other diseases classified elsewhere without behavioral disturbance: Secondary | ICD-10-CM | POA: Diagnosis not present

## 2017-03-06 DIAGNOSIS — I10 Essential (primary) hypertension: Secondary | ICD-10-CM | POA: Diagnosis not present

## 2017-03-06 DIAGNOSIS — G8929 Other chronic pain: Secondary | ICD-10-CM | POA: Diagnosis not present

## 2017-03-06 DIAGNOSIS — L89321 Pressure ulcer of left buttock, stage 1: Secondary | ICD-10-CM | POA: Diagnosis not present

## 2017-03-09 DIAGNOSIS — N400 Enlarged prostate without lower urinary tract symptoms: Secondary | ICD-10-CM | POA: Diagnosis not present

## 2017-03-09 DIAGNOSIS — Z7982 Long term (current) use of aspirin: Secondary | ICD-10-CM | POA: Diagnosis not present

## 2017-03-09 DIAGNOSIS — Z8673 Personal history of transient ischemic attack (TIA), and cerebral infarction without residual deficits: Secondary | ICD-10-CM | POA: Diagnosis not present

## 2017-03-09 DIAGNOSIS — G20 Parkinson's disease: Secondary | ICD-10-CM | POA: Diagnosis not present

## 2017-03-09 DIAGNOSIS — L89152 Pressure ulcer of sacral region, stage 2: Secondary | ICD-10-CM | POA: Diagnosis not present

## 2017-03-09 DIAGNOSIS — F329 Major depressive disorder, single episode, unspecified: Secondary | ICD-10-CM | POA: Diagnosis not present

## 2017-03-09 DIAGNOSIS — I10 Essential (primary) hypertension: Secondary | ICD-10-CM | POA: Diagnosis not present

## 2017-03-09 DIAGNOSIS — Z79899 Other long term (current) drug therapy: Secondary | ICD-10-CM | POA: Diagnosis not present

## 2017-03-09 DIAGNOSIS — E7800 Pure hypercholesterolemia, unspecified: Secondary | ICD-10-CM | POA: Diagnosis not present

## 2017-03-09 DIAGNOSIS — Z7902 Long term (current) use of antithrombotics/antiplatelets: Secondary | ICD-10-CM | POA: Diagnosis not present

## 2017-03-10 DIAGNOSIS — L89321 Pressure ulcer of left buttock, stage 1: Secondary | ICD-10-CM | POA: Diagnosis not present

## 2017-03-10 DIAGNOSIS — G20 Parkinson's disease: Secondary | ICD-10-CM | POA: Diagnosis not present

## 2017-03-10 DIAGNOSIS — F0280 Dementia in other diseases classified elsewhere without behavioral disturbance: Secondary | ICD-10-CM | POA: Diagnosis not present

## 2017-03-10 DIAGNOSIS — M6281 Muscle weakness (generalized): Secondary | ICD-10-CM | POA: Diagnosis not present

## 2017-03-10 DIAGNOSIS — G8929 Other chronic pain: Secondary | ICD-10-CM | POA: Diagnosis not present

## 2017-03-10 DIAGNOSIS — I10 Essential (primary) hypertension: Secondary | ICD-10-CM | POA: Diagnosis not present

## 2017-03-11 DIAGNOSIS — M6281 Muscle weakness (generalized): Secondary | ICD-10-CM | POA: Diagnosis not present

## 2017-03-11 DIAGNOSIS — L89321 Pressure ulcer of left buttock, stage 1: Secondary | ICD-10-CM | POA: Diagnosis not present

## 2017-03-11 DIAGNOSIS — G8929 Other chronic pain: Secondary | ICD-10-CM | POA: Diagnosis not present

## 2017-03-11 DIAGNOSIS — G20 Parkinson's disease: Secondary | ICD-10-CM | POA: Diagnosis not present

## 2017-03-11 DIAGNOSIS — F0280 Dementia in other diseases classified elsewhere without behavioral disturbance: Secondary | ICD-10-CM | POA: Diagnosis not present

## 2017-03-11 DIAGNOSIS — I10 Essential (primary) hypertension: Secondary | ICD-10-CM | POA: Diagnosis not present

## 2017-03-13 DIAGNOSIS — G8929 Other chronic pain: Secondary | ICD-10-CM | POA: Diagnosis not present

## 2017-03-13 DIAGNOSIS — M6281 Muscle weakness (generalized): Secondary | ICD-10-CM | POA: Diagnosis not present

## 2017-03-13 DIAGNOSIS — F0280 Dementia in other diseases classified elsewhere without behavioral disturbance: Secondary | ICD-10-CM | POA: Diagnosis not present

## 2017-03-13 DIAGNOSIS — L89321 Pressure ulcer of left buttock, stage 1: Secondary | ICD-10-CM | POA: Diagnosis not present

## 2017-03-13 DIAGNOSIS — I10 Essential (primary) hypertension: Secondary | ICD-10-CM | POA: Diagnosis not present

## 2017-03-13 DIAGNOSIS — G20 Parkinson's disease: Secondary | ICD-10-CM | POA: Diagnosis not present

## 2017-03-14 DIAGNOSIS — G8929 Other chronic pain: Secondary | ICD-10-CM | POA: Diagnosis not present

## 2017-03-14 DIAGNOSIS — L89321 Pressure ulcer of left buttock, stage 1: Secondary | ICD-10-CM | POA: Diagnosis not present

## 2017-03-14 DIAGNOSIS — M6281 Muscle weakness (generalized): Secondary | ICD-10-CM | POA: Diagnosis not present

## 2017-03-14 DIAGNOSIS — G20 Parkinson's disease: Secondary | ICD-10-CM | POA: Diagnosis not present

## 2017-03-14 DIAGNOSIS — F0280 Dementia in other diseases classified elsewhere without behavioral disturbance: Secondary | ICD-10-CM | POA: Diagnosis not present

## 2017-03-14 DIAGNOSIS — I10 Essential (primary) hypertension: Secondary | ICD-10-CM | POA: Diagnosis not present

## 2017-03-15 DIAGNOSIS — M6281 Muscle weakness (generalized): Secondary | ICD-10-CM | POA: Diagnosis not present

## 2017-03-15 DIAGNOSIS — I10 Essential (primary) hypertension: Secondary | ICD-10-CM | POA: Diagnosis not present

## 2017-03-15 DIAGNOSIS — F0280 Dementia in other diseases classified elsewhere without behavioral disturbance: Secondary | ICD-10-CM | POA: Diagnosis not present

## 2017-03-15 DIAGNOSIS — L89321 Pressure ulcer of left buttock, stage 1: Secondary | ICD-10-CM | POA: Diagnosis not present

## 2017-03-15 DIAGNOSIS — G20 Parkinson's disease: Secondary | ICD-10-CM | POA: Diagnosis not present

## 2017-03-15 DIAGNOSIS — G8929 Other chronic pain: Secondary | ICD-10-CM | POA: Diagnosis not present

## 2017-03-17 DIAGNOSIS — G8929 Other chronic pain: Secondary | ICD-10-CM | POA: Diagnosis not present

## 2017-03-17 DIAGNOSIS — G20 Parkinson's disease: Secondary | ICD-10-CM | POA: Diagnosis not present

## 2017-03-17 DIAGNOSIS — F0280 Dementia in other diseases classified elsewhere without behavioral disturbance: Secondary | ICD-10-CM | POA: Diagnosis not present

## 2017-03-17 DIAGNOSIS — I10 Essential (primary) hypertension: Secondary | ICD-10-CM | POA: Diagnosis not present

## 2017-03-17 DIAGNOSIS — L89321 Pressure ulcer of left buttock, stage 1: Secondary | ICD-10-CM | POA: Diagnosis not present

## 2017-03-17 DIAGNOSIS — M6281 Muscle weakness (generalized): Secondary | ICD-10-CM | POA: Diagnosis not present

## 2017-03-18 DIAGNOSIS — G8929 Other chronic pain: Secondary | ICD-10-CM | POA: Diagnosis not present

## 2017-03-18 DIAGNOSIS — F0280 Dementia in other diseases classified elsewhere without behavioral disturbance: Secondary | ICD-10-CM | POA: Diagnosis not present

## 2017-03-18 DIAGNOSIS — I10 Essential (primary) hypertension: Secondary | ICD-10-CM | POA: Diagnosis not present

## 2017-03-18 DIAGNOSIS — L89321 Pressure ulcer of left buttock, stage 1: Secondary | ICD-10-CM | POA: Diagnosis not present

## 2017-03-18 DIAGNOSIS — M6281 Muscle weakness (generalized): Secondary | ICD-10-CM | POA: Diagnosis not present

## 2017-03-18 DIAGNOSIS — G20 Parkinson's disease: Secondary | ICD-10-CM | POA: Diagnosis not present

## 2017-03-20 DIAGNOSIS — G20 Parkinson's disease: Secondary | ICD-10-CM | POA: Diagnosis not present

## 2017-03-20 DIAGNOSIS — L89321 Pressure ulcer of left buttock, stage 1: Secondary | ICD-10-CM | POA: Diagnosis not present

## 2017-03-20 DIAGNOSIS — M6281 Muscle weakness (generalized): Secondary | ICD-10-CM | POA: Diagnosis not present

## 2017-03-20 DIAGNOSIS — F0280 Dementia in other diseases classified elsewhere without behavioral disturbance: Secondary | ICD-10-CM | POA: Diagnosis not present

## 2017-03-20 DIAGNOSIS — G8929 Other chronic pain: Secondary | ICD-10-CM | POA: Diagnosis not present

## 2017-03-20 DIAGNOSIS — I10 Essential (primary) hypertension: Secondary | ICD-10-CM | POA: Diagnosis not present

## 2017-03-21 DIAGNOSIS — L89321 Pressure ulcer of left buttock, stage 1: Secondary | ICD-10-CM | POA: Diagnosis not present

## 2017-03-21 DIAGNOSIS — M6281 Muscle weakness (generalized): Secondary | ICD-10-CM | POA: Diagnosis not present

## 2017-03-21 DIAGNOSIS — I10 Essential (primary) hypertension: Secondary | ICD-10-CM | POA: Diagnosis not present

## 2017-03-21 DIAGNOSIS — G20 Parkinson's disease: Secondary | ICD-10-CM | POA: Diagnosis not present

## 2017-03-21 DIAGNOSIS — F0280 Dementia in other diseases classified elsewhere without behavioral disturbance: Secondary | ICD-10-CM | POA: Diagnosis not present

## 2017-03-21 DIAGNOSIS — G8929 Other chronic pain: Secondary | ICD-10-CM | POA: Diagnosis not present

## 2017-03-23 DIAGNOSIS — F0280 Dementia in other diseases classified elsewhere without behavioral disturbance: Secondary | ICD-10-CM | POA: Diagnosis not present

## 2017-03-23 DIAGNOSIS — M6281 Muscle weakness (generalized): Secondary | ICD-10-CM | POA: Diagnosis not present

## 2017-03-23 DIAGNOSIS — G8929 Other chronic pain: Secondary | ICD-10-CM | POA: Diagnosis not present

## 2017-03-23 DIAGNOSIS — L89152 Pressure ulcer of sacral region, stage 2: Secondary | ICD-10-CM | POA: Diagnosis not present

## 2017-03-23 DIAGNOSIS — G20 Parkinson's disease: Secondary | ICD-10-CM | POA: Diagnosis not present

## 2017-03-23 DIAGNOSIS — L89321 Pressure ulcer of left buttock, stage 1: Secondary | ICD-10-CM | POA: Diagnosis not present

## 2017-03-23 DIAGNOSIS — I10 Essential (primary) hypertension: Secondary | ICD-10-CM | POA: Diagnosis not present

## 2017-03-24 DIAGNOSIS — G8929 Other chronic pain: Secondary | ICD-10-CM | POA: Diagnosis not present

## 2017-03-24 DIAGNOSIS — F0280 Dementia in other diseases classified elsewhere without behavioral disturbance: Secondary | ICD-10-CM | POA: Diagnosis not present

## 2017-03-24 DIAGNOSIS — I10 Essential (primary) hypertension: Secondary | ICD-10-CM | POA: Diagnosis not present

## 2017-03-24 DIAGNOSIS — G20 Parkinson's disease: Secondary | ICD-10-CM | POA: Diagnosis not present

## 2017-03-24 DIAGNOSIS — L89321 Pressure ulcer of left buttock, stage 1: Secondary | ICD-10-CM | POA: Diagnosis not present

## 2017-03-24 DIAGNOSIS — M6281 Muscle weakness (generalized): Secondary | ICD-10-CM | POA: Diagnosis not present

## 2017-03-27 DIAGNOSIS — G20 Parkinson's disease: Secondary | ICD-10-CM | POA: Diagnosis not present

## 2017-03-27 DIAGNOSIS — G8929 Other chronic pain: Secondary | ICD-10-CM | POA: Diagnosis not present

## 2017-03-27 DIAGNOSIS — M6281 Muscle weakness (generalized): Secondary | ICD-10-CM | POA: Diagnosis not present

## 2017-03-27 DIAGNOSIS — L89321 Pressure ulcer of left buttock, stage 1: Secondary | ICD-10-CM | POA: Diagnosis not present

## 2017-03-27 DIAGNOSIS — F0280 Dementia in other diseases classified elsewhere without behavioral disturbance: Secondary | ICD-10-CM | POA: Diagnosis not present

## 2017-03-27 DIAGNOSIS — I10 Essential (primary) hypertension: Secondary | ICD-10-CM | POA: Diagnosis not present

## 2017-03-29 DIAGNOSIS — L89321 Pressure ulcer of left buttock, stage 1: Secondary | ICD-10-CM | POA: Diagnosis not present

## 2017-03-29 DIAGNOSIS — G8929 Other chronic pain: Secondary | ICD-10-CM | POA: Diagnosis not present

## 2017-03-29 DIAGNOSIS — G20 Parkinson's disease: Secondary | ICD-10-CM | POA: Diagnosis not present

## 2017-03-29 DIAGNOSIS — F0280 Dementia in other diseases classified elsewhere without behavioral disturbance: Secondary | ICD-10-CM | POA: Diagnosis not present

## 2017-03-29 DIAGNOSIS — I10 Essential (primary) hypertension: Secondary | ICD-10-CM | POA: Diagnosis not present

## 2017-03-29 DIAGNOSIS — M6281 Muscle weakness (generalized): Secondary | ICD-10-CM | POA: Diagnosis not present

## 2017-03-31 DIAGNOSIS — M6281 Muscle weakness (generalized): Secondary | ICD-10-CM | POA: Diagnosis not present

## 2017-03-31 DIAGNOSIS — F0280 Dementia in other diseases classified elsewhere without behavioral disturbance: Secondary | ICD-10-CM | POA: Diagnosis not present

## 2017-03-31 DIAGNOSIS — L89321 Pressure ulcer of left buttock, stage 1: Secondary | ICD-10-CM | POA: Diagnosis not present

## 2017-03-31 DIAGNOSIS — G8929 Other chronic pain: Secondary | ICD-10-CM | POA: Diagnosis not present

## 2017-03-31 DIAGNOSIS — G20 Parkinson's disease: Secondary | ICD-10-CM | POA: Diagnosis not present

## 2017-03-31 DIAGNOSIS — I10 Essential (primary) hypertension: Secondary | ICD-10-CM | POA: Diagnosis not present

## 2017-04-01 DIAGNOSIS — G8929 Other chronic pain: Secondary | ICD-10-CM | POA: Diagnosis not present

## 2017-04-01 DIAGNOSIS — G20 Parkinson's disease: Secondary | ICD-10-CM | POA: Diagnosis not present

## 2017-04-01 DIAGNOSIS — L89321 Pressure ulcer of left buttock, stage 1: Secondary | ICD-10-CM | POA: Diagnosis not present

## 2017-04-01 DIAGNOSIS — F0280 Dementia in other diseases classified elsewhere without behavioral disturbance: Secondary | ICD-10-CM | POA: Diagnosis not present

## 2017-04-01 DIAGNOSIS — I10 Essential (primary) hypertension: Secondary | ICD-10-CM | POA: Diagnosis not present

## 2017-04-01 DIAGNOSIS — M6281 Muscle weakness (generalized): Secondary | ICD-10-CM | POA: Diagnosis not present

## 2017-04-04 DIAGNOSIS — F0280 Dementia in other diseases classified elsewhere without behavioral disturbance: Secondary | ICD-10-CM | POA: Diagnosis not present

## 2017-04-04 DIAGNOSIS — G20 Parkinson's disease: Secondary | ICD-10-CM | POA: Diagnosis not present

## 2017-04-04 DIAGNOSIS — G8929 Other chronic pain: Secondary | ICD-10-CM | POA: Diagnosis not present

## 2017-04-04 DIAGNOSIS — I10 Essential (primary) hypertension: Secondary | ICD-10-CM | POA: Diagnosis not present

## 2017-04-04 DIAGNOSIS — M6281 Muscle weakness (generalized): Secondary | ICD-10-CM | POA: Diagnosis not present

## 2017-04-04 DIAGNOSIS — R2681 Unsteadiness on feet: Secondary | ICD-10-CM | POA: Diagnosis not present

## 2017-04-08 DIAGNOSIS — G20 Parkinson's disease: Secondary | ICD-10-CM | POA: Diagnosis not present

## 2017-04-08 DIAGNOSIS — F0280 Dementia in other diseases classified elsewhere without behavioral disturbance: Secondary | ICD-10-CM | POA: Diagnosis not present

## 2017-04-08 DIAGNOSIS — I10 Essential (primary) hypertension: Secondary | ICD-10-CM | POA: Diagnosis not present

## 2017-04-08 DIAGNOSIS — M6281 Muscle weakness (generalized): Secondary | ICD-10-CM | POA: Diagnosis not present

## 2017-04-08 DIAGNOSIS — R2681 Unsteadiness on feet: Secondary | ICD-10-CM | POA: Diagnosis not present

## 2017-04-08 DIAGNOSIS — G8929 Other chronic pain: Secondary | ICD-10-CM | POA: Diagnosis not present

## 2017-04-10 ENCOUNTER — Ambulatory Visit (INDEPENDENT_AMBULATORY_CARE_PROVIDER_SITE_OTHER): Payer: Medicare Other | Admitting: Otolaryngology

## 2017-04-10 DIAGNOSIS — K1121 Acute sialoadenitis: Secondary | ICD-10-CM | POA: Diagnosis not present

## 2017-04-11 DIAGNOSIS — R2681 Unsteadiness on feet: Secondary | ICD-10-CM | POA: Diagnosis not present

## 2017-04-11 DIAGNOSIS — M6281 Muscle weakness (generalized): Secondary | ICD-10-CM | POA: Diagnosis not present

## 2017-04-11 DIAGNOSIS — F0280 Dementia in other diseases classified elsewhere without behavioral disturbance: Secondary | ICD-10-CM | POA: Diagnosis not present

## 2017-04-11 DIAGNOSIS — G20 Parkinson's disease: Secondary | ICD-10-CM | POA: Diagnosis not present

## 2017-04-11 DIAGNOSIS — I10 Essential (primary) hypertension: Secondary | ICD-10-CM | POA: Diagnosis not present

## 2017-04-11 DIAGNOSIS — G8929 Other chronic pain: Secondary | ICD-10-CM | POA: Diagnosis not present

## 2017-04-12 DIAGNOSIS — I10 Essential (primary) hypertension: Secondary | ICD-10-CM | POA: Diagnosis not present

## 2017-04-12 DIAGNOSIS — G8929 Other chronic pain: Secondary | ICD-10-CM | POA: Diagnosis not present

## 2017-04-12 DIAGNOSIS — R2681 Unsteadiness on feet: Secondary | ICD-10-CM | POA: Diagnosis not present

## 2017-04-12 DIAGNOSIS — G20 Parkinson's disease: Secondary | ICD-10-CM | POA: Diagnosis not present

## 2017-04-12 DIAGNOSIS — F0280 Dementia in other diseases classified elsewhere without behavioral disturbance: Secondary | ICD-10-CM | POA: Diagnosis not present

## 2017-04-12 DIAGNOSIS — M6281 Muscle weakness (generalized): Secondary | ICD-10-CM | POA: Diagnosis not present

## 2017-04-13 DIAGNOSIS — R2681 Unsteadiness on feet: Secondary | ICD-10-CM | POA: Diagnosis not present

## 2017-04-13 DIAGNOSIS — I10 Essential (primary) hypertension: Secondary | ICD-10-CM | POA: Diagnosis not present

## 2017-04-13 DIAGNOSIS — G20 Parkinson's disease: Secondary | ICD-10-CM | POA: Diagnosis not present

## 2017-04-13 DIAGNOSIS — M6281 Muscle weakness (generalized): Secondary | ICD-10-CM | POA: Diagnosis not present

## 2017-04-13 DIAGNOSIS — G8929 Other chronic pain: Secondary | ICD-10-CM | POA: Diagnosis not present

## 2017-04-13 DIAGNOSIS — F0280 Dementia in other diseases classified elsewhere without behavioral disturbance: Secondary | ICD-10-CM | POA: Diagnosis not present

## 2017-04-15 DIAGNOSIS — F0280 Dementia in other diseases classified elsewhere without behavioral disturbance: Secondary | ICD-10-CM | POA: Diagnosis not present

## 2017-04-15 DIAGNOSIS — I10 Essential (primary) hypertension: Secondary | ICD-10-CM | POA: Diagnosis not present

## 2017-04-15 DIAGNOSIS — G8929 Other chronic pain: Secondary | ICD-10-CM | POA: Diagnosis not present

## 2017-04-15 DIAGNOSIS — M6281 Muscle weakness (generalized): Secondary | ICD-10-CM | POA: Diagnosis not present

## 2017-04-15 DIAGNOSIS — G20 Parkinson's disease: Secondary | ICD-10-CM | POA: Diagnosis not present

## 2017-04-15 DIAGNOSIS — R2681 Unsteadiness on feet: Secondary | ICD-10-CM | POA: Diagnosis not present

## 2017-04-19 DIAGNOSIS — F0280 Dementia in other diseases classified elsewhere without behavioral disturbance: Secondary | ICD-10-CM | POA: Diagnosis not present

## 2017-04-19 DIAGNOSIS — R2681 Unsteadiness on feet: Secondary | ICD-10-CM | POA: Diagnosis not present

## 2017-04-19 DIAGNOSIS — G20 Parkinson's disease: Secondary | ICD-10-CM | POA: Diagnosis not present

## 2017-04-19 DIAGNOSIS — I10 Essential (primary) hypertension: Secondary | ICD-10-CM | POA: Diagnosis not present

## 2017-04-19 DIAGNOSIS — M6281 Muscle weakness (generalized): Secondary | ICD-10-CM | POA: Diagnosis not present

## 2017-04-19 DIAGNOSIS — G8929 Other chronic pain: Secondary | ICD-10-CM | POA: Diagnosis not present

## 2017-04-21 DIAGNOSIS — R2681 Unsteadiness on feet: Secondary | ICD-10-CM | POA: Diagnosis not present

## 2017-04-21 DIAGNOSIS — F0280 Dementia in other diseases classified elsewhere without behavioral disturbance: Secondary | ICD-10-CM | POA: Diagnosis not present

## 2017-04-21 DIAGNOSIS — G20 Parkinson's disease: Secondary | ICD-10-CM | POA: Diagnosis not present

## 2017-04-21 DIAGNOSIS — M6281 Muscle weakness (generalized): Secondary | ICD-10-CM | POA: Diagnosis not present

## 2017-04-21 DIAGNOSIS — G8929 Other chronic pain: Secondary | ICD-10-CM | POA: Diagnosis not present

## 2017-04-21 DIAGNOSIS — I10 Essential (primary) hypertension: Secondary | ICD-10-CM | POA: Diagnosis not present

## 2017-04-25 DIAGNOSIS — Z09 Encounter for follow-up examination after completed treatment for conditions other than malignant neoplasm: Secondary | ICD-10-CM | POA: Diagnosis not present

## 2017-04-25 DIAGNOSIS — L89152 Pressure ulcer of sacral region, stage 2: Secondary | ICD-10-CM | POA: Diagnosis not present

## 2017-04-25 DIAGNOSIS — Z872 Personal history of diseases of the skin and subcutaneous tissue: Secondary | ICD-10-CM | POA: Diagnosis not present

## 2017-04-25 DIAGNOSIS — G20 Parkinson's disease: Secondary | ICD-10-CM | POA: Diagnosis not present

## 2017-05-04 DIAGNOSIS — G4733 Obstructive sleep apnea (adult) (pediatric): Secondary | ICD-10-CM | POA: Diagnosis not present

## 2017-05-04 DIAGNOSIS — G20 Parkinson's disease: Secondary | ICD-10-CM | POA: Diagnosis not present

## 2017-05-04 DIAGNOSIS — I10 Essential (primary) hypertension: Secondary | ICD-10-CM | POA: Diagnosis not present

## 2017-05-04 DIAGNOSIS — E7800 Pure hypercholesterolemia, unspecified: Secondary | ICD-10-CM | POA: Diagnosis not present

## 2017-05-04 DIAGNOSIS — N400 Enlarged prostate without lower urinary tract symptoms: Secondary | ICD-10-CM | POA: Diagnosis not present

## 2017-05-04 DIAGNOSIS — F321 Major depressive disorder, single episode, moderate: Secondary | ICD-10-CM | POA: Diagnosis not present

## 2017-05-04 DIAGNOSIS — F0280 Dementia in other diseases classified elsewhere without behavioral disturbance: Secondary | ICD-10-CM | POA: Diagnosis not present

## 2017-05-04 DIAGNOSIS — Z299 Encounter for prophylactic measures, unspecified: Secondary | ICD-10-CM | POA: Diagnosis not present

## 2017-06-05 DIAGNOSIS — Z79899 Other long term (current) drug therapy: Secondary | ICD-10-CM | POA: Diagnosis not present

## 2017-06-05 DIAGNOSIS — Z125 Encounter for screening for malignant neoplasm of prostate: Secondary | ICD-10-CM | POA: Diagnosis not present

## 2017-06-05 DIAGNOSIS — Z299 Encounter for prophylactic measures, unspecified: Secondary | ICD-10-CM | POA: Diagnosis not present

## 2017-06-05 DIAGNOSIS — Z23 Encounter for immunization: Secondary | ICD-10-CM | POA: Diagnosis not present

## 2017-06-05 DIAGNOSIS — Z7189 Other specified counseling: Secondary | ICD-10-CM | POA: Diagnosis not present

## 2017-06-05 DIAGNOSIS — Z0000 Encounter for general adult medical examination without abnormal findings: Secondary | ICD-10-CM | POA: Diagnosis not present

## 2017-06-05 DIAGNOSIS — E7800 Pure hypercholesterolemia, unspecified: Secondary | ICD-10-CM | POA: Diagnosis not present

## 2017-06-05 DIAGNOSIS — Z1331 Encounter for screening for depression: Secondary | ICD-10-CM | POA: Diagnosis not present

## 2017-06-05 DIAGNOSIS — R5383 Other fatigue: Secondary | ICD-10-CM | POA: Diagnosis not present

## 2017-06-05 DIAGNOSIS — Z1339 Encounter for screening examination for other mental health and behavioral disorders: Secondary | ICD-10-CM | POA: Diagnosis not present

## 2017-06-05 DIAGNOSIS — Z6828 Body mass index (BMI) 28.0-28.9, adult: Secondary | ICD-10-CM | POA: Diagnosis not present

## 2017-07-11 DIAGNOSIS — Z8673 Personal history of transient ischemic attack (TIA), and cerebral infarction without residual deficits: Secondary | ICD-10-CM | POA: Diagnosis not present

## 2017-07-11 DIAGNOSIS — E860 Dehydration: Secondary | ICD-10-CM | POA: Diagnosis not present

## 2017-07-11 DIAGNOSIS — N390 Urinary tract infection, site not specified: Secondary | ICD-10-CM | POA: Diagnosis not present

## 2017-07-11 DIAGNOSIS — R4182 Altered mental status, unspecified: Secondary | ICD-10-CM | POA: Diagnosis not present

## 2017-07-11 DIAGNOSIS — I10 Essential (primary) hypertension: Secondary | ICD-10-CM | POA: Diagnosis not present

## 2017-07-11 DIAGNOSIS — G3183 Dementia with Lewy bodies: Secondary | ICD-10-CM | POA: Diagnosis not present

## 2017-07-11 DIAGNOSIS — R410 Disorientation, unspecified: Secondary | ICD-10-CM | POA: Diagnosis not present

## 2017-07-11 DIAGNOSIS — F0280 Dementia in other diseases classified elsewhere without behavioral disturbance: Secondary | ICD-10-CM | POA: Diagnosis not present

## 2017-07-11 DIAGNOSIS — G8929 Other chronic pain: Secondary | ICD-10-CM | POA: Diagnosis not present

## 2017-07-12 DIAGNOSIS — G3183 Dementia with Lewy bodies: Secondary | ICD-10-CM | POA: Diagnosis not present

## 2017-07-12 DIAGNOSIS — N390 Urinary tract infection, site not specified: Secondary | ICD-10-CM | POA: Diagnosis not present

## 2017-07-12 DIAGNOSIS — R4182 Altered mental status, unspecified: Secondary | ICD-10-CM | POA: Diagnosis not present

## 2017-07-12 DIAGNOSIS — F0280 Dementia in other diseases classified elsewhere without behavioral disturbance: Secondary | ICD-10-CM | POA: Diagnosis not present

## 2017-07-13 DIAGNOSIS — Z79899 Other long term (current) drug therapy: Secondary | ICD-10-CM | POA: Diagnosis not present

## 2017-07-13 DIAGNOSIS — R41841 Cognitive communication deficit: Secondary | ICD-10-CM | POA: Diagnosis not present

## 2017-07-13 DIAGNOSIS — Z8249 Family history of ischemic heart disease and other diseases of the circulatory system: Secondary | ICD-10-CM | POA: Diagnosis not present

## 2017-07-13 DIAGNOSIS — G3183 Dementia with Lewy bodies: Secondary | ICD-10-CM | POA: Diagnosis present

## 2017-07-13 DIAGNOSIS — G20 Parkinson's disease: Secondary | ICD-10-CM | POA: Diagnosis not present

## 2017-07-13 DIAGNOSIS — M6281 Muscle weakness (generalized): Secondary | ICD-10-CM | POA: Diagnosis not present

## 2017-07-13 DIAGNOSIS — Z7982 Long term (current) use of aspirin: Secondary | ICD-10-CM | POA: Diagnosis not present

## 2017-07-13 DIAGNOSIS — I10 Essential (primary) hypertension: Secondary | ICD-10-CM | POA: Diagnosis present

## 2017-07-13 DIAGNOSIS — N390 Urinary tract infection, site not specified: Secondary | ICD-10-CM | POA: Diagnosis not present

## 2017-07-13 DIAGNOSIS — Z9181 History of falling: Secondary | ICD-10-CM | POA: Diagnosis not present

## 2017-07-13 DIAGNOSIS — G8929 Other chronic pain: Secondary | ICD-10-CM | POA: Diagnosis present

## 2017-07-13 DIAGNOSIS — R2681 Unsteadiness on feet: Secondary | ICD-10-CM | POA: Diagnosis not present

## 2017-07-13 DIAGNOSIS — Z7401 Bed confinement status: Secondary | ICD-10-CM | POA: Diagnosis not present

## 2017-07-13 DIAGNOSIS — F329 Major depressive disorder, single episode, unspecified: Secondary | ICD-10-CM | POA: Diagnosis not present

## 2017-07-13 DIAGNOSIS — Z7902 Long term (current) use of antithrombotics/antiplatelets: Secondary | ICD-10-CM | POA: Diagnosis not present

## 2017-07-13 DIAGNOSIS — Z8673 Personal history of transient ischemic attack (TIA), and cerebral infarction without residual deficits: Secondary | ICD-10-CM | POA: Diagnosis not present

## 2017-07-13 DIAGNOSIS — G894 Chronic pain syndrome: Secondary | ICD-10-CM | POA: Diagnosis not present

## 2017-07-13 DIAGNOSIS — Z818 Family history of other mental and behavioral disorders: Secondary | ICD-10-CM | POA: Diagnosis not present

## 2017-07-13 DIAGNOSIS — Z79891 Long term (current) use of opiate analgesic: Secondary | ICD-10-CM | POA: Diagnosis not present

## 2017-07-13 DIAGNOSIS — R1312 Dysphagia, oropharyngeal phase: Secondary | ICD-10-CM | POA: Diagnosis not present

## 2017-07-13 DIAGNOSIS — E785 Hyperlipidemia, unspecified: Secondary | ICD-10-CM | POA: Diagnosis not present

## 2017-07-13 DIAGNOSIS — F0280 Dementia in other diseases classified elsewhere without behavioral disturbance: Secondary | ICD-10-CM | POA: Diagnosis present

## 2017-07-13 DIAGNOSIS — R4182 Altered mental status, unspecified: Secondary | ICD-10-CM | POA: Diagnosis not present

## 2017-07-16 DIAGNOSIS — G894 Chronic pain syndrome: Secondary | ICD-10-CM | POA: Diagnosis not present

## 2017-07-16 DIAGNOSIS — E119 Type 2 diabetes mellitus without complications: Secondary | ICD-10-CM | POA: Diagnosis not present

## 2017-07-16 DIAGNOSIS — E7849 Other hyperlipidemia: Secondary | ICD-10-CM | POA: Diagnosis not present

## 2017-07-16 DIAGNOSIS — Z9181 History of falling: Secondary | ICD-10-CM | POA: Diagnosis not present

## 2017-07-16 DIAGNOSIS — F329 Major depressive disorder, single episode, unspecified: Secondary | ICD-10-CM | POA: Diagnosis not present

## 2017-07-16 DIAGNOSIS — R413 Other amnesia: Secondary | ICD-10-CM | POA: Diagnosis not present

## 2017-07-16 DIAGNOSIS — R1312 Dysphagia, oropharyngeal phase: Secondary | ICD-10-CM | POA: Diagnosis not present

## 2017-07-16 DIAGNOSIS — Z79899 Other long term (current) drug therapy: Secondary | ICD-10-CM | POA: Diagnosis not present

## 2017-07-16 DIAGNOSIS — I959 Hypotension, unspecified: Secondary | ICD-10-CM | POA: Diagnosis not present

## 2017-07-16 DIAGNOSIS — E785 Hyperlipidemia, unspecified: Secondary | ICD-10-CM | POA: Diagnosis not present

## 2017-07-16 DIAGNOSIS — R2681 Unsteadiness on feet: Secondary | ICD-10-CM | POA: Diagnosis not present

## 2017-07-16 DIAGNOSIS — Z7401 Bed confinement status: Secondary | ICD-10-CM | POA: Diagnosis not present

## 2017-07-16 DIAGNOSIS — R4182 Altered mental status, unspecified: Secondary | ICD-10-CM | POA: Diagnosis not present

## 2017-07-16 DIAGNOSIS — I10 Essential (primary) hypertension: Secondary | ICD-10-CM | POA: Diagnosis not present

## 2017-07-16 DIAGNOSIS — F0280 Dementia in other diseases classified elsewhere without behavioral disturbance: Secondary | ICD-10-CM | POA: Diagnosis not present

## 2017-07-16 DIAGNOSIS — G20 Parkinson's disease: Secondary | ICD-10-CM | POA: Diagnosis not present

## 2017-07-16 DIAGNOSIS — G3183 Dementia with Lewy bodies: Secondary | ICD-10-CM | POA: Diagnosis not present

## 2017-07-16 DIAGNOSIS — N390 Urinary tract infection, site not specified: Secondary | ICD-10-CM | POA: Diagnosis not present

## 2017-07-16 DIAGNOSIS — D518 Other vitamin B12 deficiency anemias: Secondary | ICD-10-CM | POA: Diagnosis not present

## 2017-07-16 DIAGNOSIS — M6281 Muscle weakness (generalized): Secondary | ICD-10-CM | POA: Diagnosis not present

## 2017-07-16 DIAGNOSIS — G309 Alzheimer's disease, unspecified: Secondary | ICD-10-CM | POA: Diagnosis not present

## 2017-07-16 DIAGNOSIS — R41841 Cognitive communication deficit: Secondary | ICD-10-CM | POA: Diagnosis not present

## 2017-07-17 DIAGNOSIS — R4182 Altered mental status, unspecified: Secondary | ICD-10-CM | POA: Diagnosis not present

## 2017-07-17 DIAGNOSIS — N390 Urinary tract infection, site not specified: Secondary | ICD-10-CM | POA: Diagnosis not present

## 2017-07-17 DIAGNOSIS — G20 Parkinson's disease: Secondary | ICD-10-CM | POA: Diagnosis not present

## 2017-07-17 DIAGNOSIS — G3183 Dementia with Lewy bodies: Secondary | ICD-10-CM | POA: Diagnosis not present

## 2017-07-19 ENCOUNTER — Ambulatory Visit: Payer: Medicare Other | Admitting: Neurology

## 2017-07-19 DIAGNOSIS — D518 Other vitamin B12 deficiency anemias: Secondary | ICD-10-CM | POA: Diagnosis not present

## 2017-07-19 DIAGNOSIS — G20 Parkinson's disease: Secondary | ICD-10-CM | POA: Diagnosis not present

## 2017-07-19 DIAGNOSIS — E119 Type 2 diabetes mellitus without complications: Secondary | ICD-10-CM | POA: Diagnosis not present

## 2017-07-19 DIAGNOSIS — I10 Essential (primary) hypertension: Secondary | ICD-10-CM | POA: Diagnosis not present

## 2017-07-19 DIAGNOSIS — Z79899 Other long term (current) drug therapy: Secondary | ICD-10-CM | POA: Diagnosis not present

## 2017-07-19 DIAGNOSIS — G894 Chronic pain syndrome: Secondary | ICD-10-CM | POA: Diagnosis not present

## 2017-07-19 DIAGNOSIS — G3183 Dementia with Lewy bodies: Secondary | ICD-10-CM | POA: Diagnosis not present

## 2017-07-19 DIAGNOSIS — E7849 Other hyperlipidemia: Secondary | ICD-10-CM | POA: Diagnosis not present

## 2017-07-24 DIAGNOSIS — G20 Parkinson's disease: Secondary | ICD-10-CM | POA: Diagnosis not present

## 2017-07-24 DIAGNOSIS — G3183 Dementia with Lewy bodies: Secondary | ICD-10-CM | POA: Diagnosis not present

## 2017-07-24 DIAGNOSIS — G894 Chronic pain syndrome: Secondary | ICD-10-CM | POA: Diagnosis not present

## 2017-07-24 DIAGNOSIS — R4182 Altered mental status, unspecified: Secondary | ICD-10-CM | POA: Diagnosis not present

## 2017-08-01 DIAGNOSIS — I10 Essential (primary) hypertension: Secondary | ICD-10-CM | POA: Diagnosis not present

## 2017-08-01 DIAGNOSIS — G894 Chronic pain syndrome: Secondary | ICD-10-CM | POA: Diagnosis not present

## 2017-08-01 DIAGNOSIS — G3183 Dementia with Lewy bodies: Secondary | ICD-10-CM | POA: Diagnosis not present

## 2017-08-01 DIAGNOSIS — G20 Parkinson's disease: Secondary | ICD-10-CM | POA: Diagnosis not present

## 2017-08-03 DIAGNOSIS — F0280 Dementia in other diseases classified elsewhere without behavioral disturbance: Secondary | ICD-10-CM | POA: Diagnosis not present

## 2017-08-03 DIAGNOSIS — G20 Parkinson's disease: Secondary | ICD-10-CM | POA: Diagnosis not present

## 2017-08-03 DIAGNOSIS — I959 Hypotension, unspecified: Secondary | ICD-10-CM | POA: Diagnosis not present

## 2017-08-03 DIAGNOSIS — R413 Other amnesia: Secondary | ICD-10-CM | POA: Diagnosis not present

## 2017-08-03 DIAGNOSIS — G309 Alzheimer's disease, unspecified: Secondary | ICD-10-CM | POA: Diagnosis not present

## 2017-08-09 DIAGNOSIS — I10 Essential (primary) hypertension: Secondary | ICD-10-CM | POA: Diagnosis not present

## 2017-08-09 DIAGNOSIS — Z6829 Body mass index (BMI) 29.0-29.9, adult: Secondary | ICD-10-CM | POA: Diagnosis not present

## 2017-08-09 DIAGNOSIS — G20 Parkinson's disease: Secondary | ICD-10-CM | POA: Diagnosis not present

## 2017-08-09 DIAGNOSIS — G3183 Dementia with Lewy bodies: Secondary | ICD-10-CM | POA: Diagnosis not present

## 2017-08-09 DIAGNOSIS — Z299 Encounter for prophylactic measures, unspecified: Secondary | ICD-10-CM | POA: Diagnosis not present

## 2017-08-09 DIAGNOSIS — F0280 Dementia in other diseases classified elsewhere without behavioral disturbance: Secondary | ICD-10-CM | POA: Diagnosis not present

## 2017-08-09 DIAGNOSIS — G4733 Obstructive sleep apnea (adult) (pediatric): Secondary | ICD-10-CM | POA: Diagnosis not present

## 2017-08-10 DIAGNOSIS — Z79891 Long term (current) use of opiate analgesic: Secondary | ICD-10-CM | POA: Diagnosis not present

## 2017-08-10 DIAGNOSIS — Z9181 History of falling: Secondary | ICD-10-CM | POA: Diagnosis not present

## 2017-08-10 DIAGNOSIS — M5134 Other intervertebral disc degeneration, thoracic region: Secondary | ICD-10-CM | POA: Diagnosis not present

## 2017-08-10 DIAGNOSIS — Z7982 Long term (current) use of aspirin: Secondary | ICD-10-CM | POA: Diagnosis not present

## 2017-08-10 DIAGNOSIS — G894 Chronic pain syndrome: Secondary | ICD-10-CM | POA: Diagnosis not present

## 2017-08-10 DIAGNOSIS — N400 Enlarged prostate without lower urinary tract symptoms: Secondary | ICD-10-CM | POA: Diagnosis not present

## 2017-08-10 DIAGNOSIS — F0280 Dementia in other diseases classified elsewhere without behavioral disturbance: Secondary | ICD-10-CM | POA: Diagnosis not present

## 2017-08-10 DIAGNOSIS — G20 Parkinson's disease: Secondary | ICD-10-CM | POA: Diagnosis not present

## 2017-08-10 DIAGNOSIS — M4802 Spinal stenosis, cervical region: Secondary | ICD-10-CM | POA: Diagnosis not present

## 2017-08-10 DIAGNOSIS — I10 Essential (primary) hypertension: Secondary | ICD-10-CM | POA: Diagnosis not present

## 2017-08-10 DIAGNOSIS — Z7902 Long term (current) use of antithrombotics/antiplatelets: Secondary | ICD-10-CM | POA: Diagnosis not present

## 2017-08-10 DIAGNOSIS — Z87440 Personal history of urinary (tract) infections: Secondary | ICD-10-CM | POA: Diagnosis not present

## 2017-08-10 DIAGNOSIS — F329 Major depressive disorder, single episode, unspecified: Secondary | ICD-10-CM | POA: Diagnosis not present

## 2017-08-16 DIAGNOSIS — M4802 Spinal stenosis, cervical region: Secondary | ICD-10-CM | POA: Diagnosis not present

## 2017-08-16 DIAGNOSIS — M5134 Other intervertebral disc degeneration, thoracic region: Secondary | ICD-10-CM | POA: Diagnosis not present

## 2017-08-16 DIAGNOSIS — G894 Chronic pain syndrome: Secondary | ICD-10-CM | POA: Diagnosis not present

## 2017-08-16 DIAGNOSIS — I10 Essential (primary) hypertension: Secondary | ICD-10-CM | POA: Diagnosis not present

## 2017-08-16 DIAGNOSIS — G20 Parkinson's disease: Secondary | ICD-10-CM | POA: Diagnosis not present

## 2017-08-16 DIAGNOSIS — F0280 Dementia in other diseases classified elsewhere without behavioral disturbance: Secondary | ICD-10-CM | POA: Diagnosis not present

## 2017-08-18 DIAGNOSIS — G894 Chronic pain syndrome: Secondary | ICD-10-CM | POA: Diagnosis not present

## 2017-08-18 DIAGNOSIS — M5134 Other intervertebral disc degeneration, thoracic region: Secondary | ICD-10-CM | POA: Diagnosis not present

## 2017-08-18 DIAGNOSIS — M4802 Spinal stenosis, cervical region: Secondary | ICD-10-CM | POA: Diagnosis not present

## 2017-08-18 DIAGNOSIS — F0280 Dementia in other diseases classified elsewhere without behavioral disturbance: Secondary | ICD-10-CM | POA: Diagnosis not present

## 2017-08-18 DIAGNOSIS — G20 Parkinson's disease: Secondary | ICD-10-CM | POA: Diagnosis not present

## 2017-08-18 DIAGNOSIS — I10 Essential (primary) hypertension: Secondary | ICD-10-CM | POA: Diagnosis not present

## 2017-08-21 DIAGNOSIS — G894 Chronic pain syndrome: Secondary | ICD-10-CM | POA: Diagnosis not present

## 2017-08-21 DIAGNOSIS — M5134 Other intervertebral disc degeneration, thoracic region: Secondary | ICD-10-CM | POA: Diagnosis not present

## 2017-08-21 DIAGNOSIS — I10 Essential (primary) hypertension: Secondary | ICD-10-CM | POA: Diagnosis not present

## 2017-08-21 DIAGNOSIS — F0280 Dementia in other diseases classified elsewhere without behavioral disturbance: Secondary | ICD-10-CM | POA: Diagnosis not present

## 2017-08-21 DIAGNOSIS — G20 Parkinson's disease: Secondary | ICD-10-CM | POA: Diagnosis not present

## 2017-08-21 DIAGNOSIS — M4802 Spinal stenosis, cervical region: Secondary | ICD-10-CM | POA: Diagnosis not present

## 2017-08-22 DIAGNOSIS — M5134 Other intervertebral disc degeneration, thoracic region: Secondary | ICD-10-CM | POA: Diagnosis not present

## 2017-08-22 DIAGNOSIS — I10 Essential (primary) hypertension: Secondary | ICD-10-CM | POA: Diagnosis not present

## 2017-08-22 DIAGNOSIS — F0280 Dementia in other diseases classified elsewhere without behavioral disturbance: Secondary | ICD-10-CM | POA: Diagnosis not present

## 2017-08-22 DIAGNOSIS — G894 Chronic pain syndrome: Secondary | ICD-10-CM | POA: Diagnosis not present

## 2017-08-22 DIAGNOSIS — G20 Parkinson's disease: Secondary | ICD-10-CM | POA: Diagnosis not present

## 2017-08-22 DIAGNOSIS — M4802 Spinal stenosis, cervical region: Secondary | ICD-10-CM | POA: Diagnosis not present

## 2017-08-23 DIAGNOSIS — I10 Essential (primary) hypertension: Secondary | ICD-10-CM | POA: Diagnosis not present

## 2017-08-23 DIAGNOSIS — M5134 Other intervertebral disc degeneration, thoracic region: Secondary | ICD-10-CM | POA: Diagnosis not present

## 2017-08-23 DIAGNOSIS — G20 Parkinson's disease: Secondary | ICD-10-CM | POA: Diagnosis not present

## 2017-08-23 DIAGNOSIS — G894 Chronic pain syndrome: Secondary | ICD-10-CM | POA: Diagnosis not present

## 2017-08-23 DIAGNOSIS — F0280 Dementia in other diseases classified elsewhere without behavioral disturbance: Secondary | ICD-10-CM | POA: Diagnosis not present

## 2017-08-23 DIAGNOSIS — M4802 Spinal stenosis, cervical region: Secondary | ICD-10-CM | POA: Diagnosis not present

## 2017-08-24 DIAGNOSIS — M5134 Other intervertebral disc degeneration, thoracic region: Secondary | ICD-10-CM | POA: Diagnosis not present

## 2017-08-24 DIAGNOSIS — G894 Chronic pain syndrome: Secondary | ICD-10-CM | POA: Diagnosis not present

## 2017-08-24 DIAGNOSIS — I10 Essential (primary) hypertension: Secondary | ICD-10-CM | POA: Diagnosis not present

## 2017-08-24 DIAGNOSIS — M4802 Spinal stenosis, cervical region: Secondary | ICD-10-CM | POA: Diagnosis not present

## 2017-08-24 DIAGNOSIS — G20 Parkinson's disease: Secondary | ICD-10-CM | POA: Diagnosis not present

## 2017-08-24 DIAGNOSIS — F0280 Dementia in other diseases classified elsewhere without behavioral disturbance: Secondary | ICD-10-CM | POA: Diagnosis not present

## 2017-08-25 DIAGNOSIS — G20 Parkinson's disease: Secondary | ICD-10-CM | POA: Diagnosis not present

## 2017-08-25 DIAGNOSIS — I10 Essential (primary) hypertension: Secondary | ICD-10-CM | POA: Diagnosis not present

## 2017-08-25 DIAGNOSIS — N390 Urinary tract infection, site not specified: Secondary | ICD-10-CM | POA: Diagnosis not present

## 2017-08-25 DIAGNOSIS — M5134 Other intervertebral disc degeneration, thoracic region: Secondary | ICD-10-CM | POA: Diagnosis not present

## 2017-08-25 DIAGNOSIS — G894 Chronic pain syndrome: Secondary | ICD-10-CM | POA: Diagnosis not present

## 2017-08-25 DIAGNOSIS — M4802 Spinal stenosis, cervical region: Secondary | ICD-10-CM | POA: Diagnosis not present

## 2017-08-25 DIAGNOSIS — F0280 Dementia in other diseases classified elsewhere without behavioral disturbance: Secondary | ICD-10-CM | POA: Diagnosis not present

## 2017-08-28 DIAGNOSIS — G20 Parkinson's disease: Secondary | ICD-10-CM | POA: Diagnosis not present

## 2017-08-28 DIAGNOSIS — M4802 Spinal stenosis, cervical region: Secondary | ICD-10-CM | POA: Diagnosis not present

## 2017-08-28 DIAGNOSIS — I10 Essential (primary) hypertension: Secondary | ICD-10-CM | POA: Diagnosis not present

## 2017-08-28 DIAGNOSIS — M5134 Other intervertebral disc degeneration, thoracic region: Secondary | ICD-10-CM | POA: Diagnosis not present

## 2017-08-28 DIAGNOSIS — F0280 Dementia in other diseases classified elsewhere without behavioral disturbance: Secondary | ICD-10-CM | POA: Diagnosis not present

## 2017-08-28 DIAGNOSIS — G894 Chronic pain syndrome: Secondary | ICD-10-CM | POA: Diagnosis not present

## 2017-08-29 DIAGNOSIS — F0280 Dementia in other diseases classified elsewhere without behavioral disturbance: Secondary | ICD-10-CM | POA: Diagnosis not present

## 2017-08-29 DIAGNOSIS — G20 Parkinson's disease: Secondary | ICD-10-CM | POA: Diagnosis not present

## 2017-08-29 DIAGNOSIS — I10 Essential (primary) hypertension: Secondary | ICD-10-CM | POA: Diagnosis not present

## 2017-08-29 DIAGNOSIS — G894 Chronic pain syndrome: Secondary | ICD-10-CM | POA: Diagnosis not present

## 2017-08-29 DIAGNOSIS — M4802 Spinal stenosis, cervical region: Secondary | ICD-10-CM | POA: Diagnosis not present

## 2017-08-29 DIAGNOSIS — M5134 Other intervertebral disc degeneration, thoracic region: Secondary | ICD-10-CM | POA: Diagnosis not present

## 2017-08-30 DIAGNOSIS — M4802 Spinal stenosis, cervical region: Secondary | ICD-10-CM | POA: Diagnosis not present

## 2017-08-30 DIAGNOSIS — G20 Parkinson's disease: Secondary | ICD-10-CM | POA: Diagnosis not present

## 2017-08-30 DIAGNOSIS — F0280 Dementia in other diseases classified elsewhere without behavioral disturbance: Secondary | ICD-10-CM | POA: Diagnosis not present

## 2017-08-30 DIAGNOSIS — I10 Essential (primary) hypertension: Secondary | ICD-10-CM | POA: Diagnosis not present

## 2017-08-30 DIAGNOSIS — G894 Chronic pain syndrome: Secondary | ICD-10-CM | POA: Diagnosis not present

## 2017-08-30 DIAGNOSIS — M5134 Other intervertebral disc degeneration, thoracic region: Secondary | ICD-10-CM | POA: Diagnosis not present

## 2017-08-31 DIAGNOSIS — F0280 Dementia in other diseases classified elsewhere without behavioral disturbance: Secondary | ICD-10-CM | POA: Diagnosis not present

## 2017-08-31 DIAGNOSIS — G894 Chronic pain syndrome: Secondary | ICD-10-CM | POA: Diagnosis not present

## 2017-08-31 DIAGNOSIS — M4802 Spinal stenosis, cervical region: Secondary | ICD-10-CM | POA: Diagnosis not present

## 2017-08-31 DIAGNOSIS — G20 Parkinson's disease: Secondary | ICD-10-CM | POA: Diagnosis not present

## 2017-08-31 DIAGNOSIS — M5134 Other intervertebral disc degeneration, thoracic region: Secondary | ICD-10-CM | POA: Diagnosis not present

## 2017-08-31 DIAGNOSIS — I10 Essential (primary) hypertension: Secondary | ICD-10-CM | POA: Diagnosis not present

## 2017-09-01 DIAGNOSIS — G894 Chronic pain syndrome: Secondary | ICD-10-CM | POA: Diagnosis not present

## 2017-09-01 DIAGNOSIS — F0280 Dementia in other diseases classified elsewhere without behavioral disturbance: Secondary | ICD-10-CM | POA: Diagnosis not present

## 2017-09-01 DIAGNOSIS — G20 Parkinson's disease: Secondary | ICD-10-CM | POA: Diagnosis not present

## 2017-09-01 DIAGNOSIS — I10 Essential (primary) hypertension: Secondary | ICD-10-CM | POA: Diagnosis not present

## 2017-09-01 DIAGNOSIS — M4802 Spinal stenosis, cervical region: Secondary | ICD-10-CM | POA: Diagnosis not present

## 2017-09-01 DIAGNOSIS — M5134 Other intervertebral disc degeneration, thoracic region: Secondary | ICD-10-CM | POA: Diagnosis not present

## 2017-09-04 DIAGNOSIS — I10 Essential (primary) hypertension: Secondary | ICD-10-CM | POA: Diagnosis not present

## 2017-09-04 DIAGNOSIS — G894 Chronic pain syndrome: Secondary | ICD-10-CM | POA: Diagnosis not present

## 2017-09-04 DIAGNOSIS — G20 Parkinson's disease: Secondary | ICD-10-CM | POA: Diagnosis not present

## 2017-09-04 DIAGNOSIS — M4802 Spinal stenosis, cervical region: Secondary | ICD-10-CM | POA: Diagnosis not present

## 2017-09-04 DIAGNOSIS — F0280 Dementia in other diseases classified elsewhere without behavioral disturbance: Secondary | ICD-10-CM | POA: Diagnosis not present

## 2017-09-04 DIAGNOSIS — M5134 Other intervertebral disc degeneration, thoracic region: Secondary | ICD-10-CM | POA: Diagnosis not present

## 2017-09-05 DIAGNOSIS — G894 Chronic pain syndrome: Secondary | ICD-10-CM | POA: Diagnosis not present

## 2017-09-05 DIAGNOSIS — F0280 Dementia in other diseases classified elsewhere without behavioral disturbance: Secondary | ICD-10-CM | POA: Diagnosis not present

## 2017-09-05 DIAGNOSIS — M5134 Other intervertebral disc degeneration, thoracic region: Secondary | ICD-10-CM | POA: Diagnosis not present

## 2017-09-05 DIAGNOSIS — G20 Parkinson's disease: Secondary | ICD-10-CM | POA: Diagnosis not present

## 2017-09-05 DIAGNOSIS — I10 Essential (primary) hypertension: Secondary | ICD-10-CM | POA: Diagnosis not present

## 2017-09-05 DIAGNOSIS — M4802 Spinal stenosis, cervical region: Secondary | ICD-10-CM | POA: Diagnosis not present

## 2017-09-06 DIAGNOSIS — G894 Chronic pain syndrome: Secondary | ICD-10-CM | POA: Diagnosis not present

## 2017-09-06 DIAGNOSIS — M5134 Other intervertebral disc degeneration, thoracic region: Secondary | ICD-10-CM | POA: Diagnosis not present

## 2017-09-06 DIAGNOSIS — F0280 Dementia in other diseases classified elsewhere without behavioral disturbance: Secondary | ICD-10-CM | POA: Diagnosis not present

## 2017-09-06 DIAGNOSIS — G20 Parkinson's disease: Secondary | ICD-10-CM | POA: Diagnosis not present

## 2017-09-06 DIAGNOSIS — I10 Essential (primary) hypertension: Secondary | ICD-10-CM | POA: Diagnosis not present

## 2017-09-06 DIAGNOSIS — M4802 Spinal stenosis, cervical region: Secondary | ICD-10-CM | POA: Diagnosis not present

## 2017-09-11 DIAGNOSIS — F0280 Dementia in other diseases classified elsewhere without behavioral disturbance: Secondary | ICD-10-CM | POA: Diagnosis not present

## 2017-09-11 DIAGNOSIS — M4802 Spinal stenosis, cervical region: Secondary | ICD-10-CM | POA: Diagnosis not present

## 2017-09-11 DIAGNOSIS — G20 Parkinson's disease: Secondary | ICD-10-CM | POA: Diagnosis not present

## 2017-09-11 DIAGNOSIS — I10 Essential (primary) hypertension: Secondary | ICD-10-CM | POA: Diagnosis not present

## 2017-09-11 DIAGNOSIS — M5134 Other intervertebral disc degeneration, thoracic region: Secondary | ICD-10-CM | POA: Diagnosis not present

## 2017-09-11 DIAGNOSIS — G894 Chronic pain syndrome: Secondary | ICD-10-CM | POA: Diagnosis not present

## 2017-09-13 DIAGNOSIS — G894 Chronic pain syndrome: Secondary | ICD-10-CM | POA: Diagnosis not present

## 2017-09-13 DIAGNOSIS — G20 Parkinson's disease: Secondary | ICD-10-CM | POA: Diagnosis not present

## 2017-09-13 DIAGNOSIS — F0280 Dementia in other diseases classified elsewhere without behavioral disturbance: Secondary | ICD-10-CM | POA: Diagnosis not present

## 2017-09-13 DIAGNOSIS — I10 Essential (primary) hypertension: Secondary | ICD-10-CM | POA: Diagnosis not present

## 2017-09-13 DIAGNOSIS — M4802 Spinal stenosis, cervical region: Secondary | ICD-10-CM | POA: Diagnosis not present

## 2017-09-13 DIAGNOSIS — M5134 Other intervertebral disc degeneration, thoracic region: Secondary | ICD-10-CM | POA: Diagnosis not present

## 2017-09-27 DIAGNOSIS — N390 Urinary tract infection, site not specified: Secondary | ICD-10-CM | POA: Diagnosis not present

## 2017-09-27 DIAGNOSIS — N3946 Mixed incontinence: Secondary | ICD-10-CM | POA: Diagnosis not present

## 2017-09-27 DIAGNOSIS — M545 Low back pain: Secondary | ICD-10-CM | POA: Diagnosis not present

## 2017-10-12 ENCOUNTER — Encounter: Payer: Self-pay | Admitting: Neurology

## 2017-10-12 ENCOUNTER — Ambulatory Visit: Payer: Medicare Other | Admitting: Neurology

## 2017-10-12 ENCOUNTER — Ambulatory Visit (INDEPENDENT_AMBULATORY_CARE_PROVIDER_SITE_OTHER): Payer: Medicare Other | Admitting: Neurology

## 2017-10-12 VITALS — BP 111/68 | HR 62 | Wt 190.0 lb

## 2017-10-12 DIAGNOSIS — G4733 Obstructive sleep apnea (adult) (pediatric): Secondary | ICD-10-CM

## 2017-10-12 NOTE — Patient Instructions (Addendum)
We will do a home sleep test and call you with the result. We will reduce the pressure to 6 cm in the meantime, will send the order to [facility] for that.  Please continue to try to hydrate well. Supplement your nutrition with Ensure or Boost about 3 per day. Follow up in 1 year for sleep apnea.

## 2017-10-12 NOTE — Progress Notes (Signed)
Subjective:    Patient ID: James Schultz is a 67 y.o. male.  HPI      Interim history:    James Schultz is a 67 year old right-handed gentleman with an underlying complex medical history of Parkinson's disease (2007, followed at [facility]), depression, anxiety, lumbar spinal stenosis, history of right pontine stroke in August 2017, cervical degenerative disc disease, kidney stone, osteoarthritis, s/p L TKA and s/p R hip surgery, who presents for followup consultation of his obstructive sleep apnea the patient is accompanied by his wife again today. I last saw him on 01/17/2017, at which time he was not compliant with CPAP. He had medication changes under Dr. Nicki Reaper at [facility]. He had some cognitive decline.  He had a recent follow-up with Dr. Nicki Reaper 08/03/2017 and I reviewed the note.  Today, 10/12/2017 (all dictated new, as well as above notes, some dictation done in note pad or Word, outside of chart, may appear as copied):    I reviewed his most recent 30 day compliance data for CPAP usage and he was poorly compliant with CPAP at the time, using his CPAP only 9 out of 30 days. In the past 90 days his compliance was just a little better but still and the poorly compliant range. The patient has lost a significant amount of weight, compared to when he had his sleep study has lost over 50 pounds. He is unable to tolerate CPAP. The pressure feels too high as well. Cannot tolerate the FFM and nasal pillows don't work, as he opens his mouth. Not drinking enough water, c/o nausea. Does not eat well enough.   The patient's allergies, current medications, family history, past medical history, past social history, past surgical history and problem list were reviewed and updated as appropriate.   Previously (copied from previous notes for reference):   I saw him on 09/19/2016, at which time he reported doing okay, but was not able to provide his own history. He had left hip pain. His wife reported most of his  history. He had a stroke in September 2017. She found him slumped over and nonresponsive. His blood pressure had been fluctuating. She did not call 911 but took him to the emergency room. He was seen at [facility] and was hospitalized for about a week. An actual stroke was not confirmed at the time as I understand. He had inpatient rehabilitation at [facility]. She reported that he had more hallucinations, delusions, unfortunately did not do very well during his rehabilitation stay. She reported that he had fallen out of his recliner recently and could not get up and they needed help to help him stand. He was taking pain medication as needed, certainly not daily. He had some left knee pain and left hip pain. He was quite good with his CPAP compliance in the month of January 2018.   He was seen in the interim by Dr. Nicki Reaper in February 2018. I reviewed the office note.    I saw him on 05/16/2016, at which time he was noncompliant with his AutoPap machine. We also reviewed his hospitalizations. He was admitted to the hospital on 04/08/2016 secondary to new left facial droop noted on 04/08/2016. He presented to the emergency room. He was diagnosed with right pontine infarct, he had a head CT without contrast on 04/08/2016 which showed generalized atrophy, no acute infarct, but brain MRI from 04/09/2016 confirmed acute right pontine infarct. Brain MRA from 04/09/2016 showed no large vessel occlusion or high-grade stenosis. He  was transferred to inpatient rehabilitation on 04/11/2016. He was discharged from rehabilitation on 04/20/2016. He was then in outpatient PT, OT and speech therapy. He was taken off of Lexapro and started a different antidepressant. He was on Plavix, Lipitor, and blood pressure medication. He was on Sinemet 2 pills first thing in the morning and then 1-1/2 pills for the other 3 doses.    I saw him on 01/30/2015, at which time he reported left knee pain and back pain. He was  taking Percocet and was seeing neurosurgery for back pain, s/p low back surgery years ago. He had left total knee replacement surgery under Dr. Maureen Ralphs in July 2014 and, in August 2014 he required hip surgery secondary to hip fracture. His wife reported that his memory was worse. He had a fall at church about 2 weeks prior. Unfortunately, he missed a step or misjudged it. He had seen Dr. Nicki Reaper at [facility] in March 2016 and a Sinemet CR 25-100 milligrams strength was added for his fourth dose. He was taking Sinemet 3 times a day, at 6, 10, and 2 PM. He was taking the CR around 6 PM. He does not feel it has made much of a difference. Some time ago, he talked with Dr. Nicki Reaper about DBS surgery and was advised against it at the time. We discussed DBS evaluation again. I asked him to take Sinemet 4 times a day and Sinemet CR 50-200 milligrams strength around 9 PM. I last saw him on 05/16/2016, at which time he was poorly compliant with his AutoPap machine. Unfortunately, he was not doing very well in the prior months, his mobility has declined, affected also by arthritis and left hip pain. He had developed left facial droop in late August 2017 and was found to have a right pontine infarct. He was in rehabilitation until early September 2017. He was in an outpatient therapies. He was taking Sinemet 2 pills in the morning and 1-1/2 pills for the other 3 doses for the day. He was placed on a different antidepressant in the hospital and Lexapro had been stopped. I suggested we change his AutoPap to CPAP of 12 cm. I increased his Sinemet to 2 pills 4 times a day. His trazodone had been stopped or he had run out and I suggested he stay off of it.   I reviewed his AutoPap compliance data from 05/02/2016 through 05/08/2016 which is only 7 days during which time he used his machine 7 days but percent used days greater than 4 hours was 29% only. Residual AHI 18.2 per hour, pressure settings of 7 cm to 18 cm. We do have 90 day  compliance data available from 02/02/2016 through 05/01/2016, during which time his compliance was 32%, residual AHI of 14.4, this was on CPAP of 9 cm.  I saw him on 09/17/14, at which time he reported feeling well with CPAP, but had sleep disturbance due to nocturia and urinary incontinence at night. He did endorse sleeping better with CPAP. His Parkinson's for the most part was stable but he had more freezing and more tremors. I did not change his medications for Parkinson's disease. I asked him to continue using CPAP regularly.   I reviewed his CPAP compliance data from 12/24/2014 through 01/22/2015 which is a total of 30 days during which time he used his machine 28 days with percent used days greater than 4 hours at 70%, indicating adequate compliance with an average usage for all days of 5 hours and 3  minutes, residual AHI at 4 per hour, leak acceptable with the 95th percentile at 11.5 L/m on a pressure of 9 cm with EPR of 3.   I saw him on 05/02/2014, at which time he reported that he initially did not know how to use the CPAP machine but after he changed to another DME provider and received another machine, he was using it well. He felt improved with his sleep. He was compliant with treatment. He reported chronic back pain and right hip pain. He also reported foot pain and some depressive symptoms. He was tried on Wellbutrin in the past but felt worse.     He used to see Dr. Jim Like for his Parkinson's disease and was last seen by him on 01/03/2014, at which time he was taking Sinemet 4 times a day, starting at 7 AM every 4 hours. He was also on Mirapex 1 mg strength one pill 3 times a day and Azilect 1 mg once daily. He had mild wearing off symptoms of motor fluctuations. He had developed kidney stones which were treated in April 2015. He was first seen by Dr. Janann Colonel and January 2015 and prior to that he used to see Dr. Morene Antu and was last seen by him in March 2013 at which time they discussed  potential DBS treatment. He was instructed to follow-up with Dr. Nicki Reaper at [facility] for further evaluation. He had participated in a Parkinson's disease study at [facility] for 5 years which she finished in 2014.    I reviewed his compliance data from 08/16/2014 through 09/14/2014 which is a total of 30 days during which time he used his CPAP every day except for 2 days with percent used days greater than 4 hours of only 50%, indicating suboptimal compliance with an average usage of 3 hours and 41 minutes, residual AHI acceptable at 4.9 per hour and leak acceptable with the 95th percentile at 12.5 L/m. Pressure at 9 cm with EPR of 3.   I first met him on 01/20/2014 at the request of Dr. Janann Colonel, at which time the patient reported that he was diagnosed with severe obstructive sleep apnea some 10 years ago. He reported being on CPAP but having difficulty tolerating CPAP. He reported cognitive decline as well as daytime somnolence and admitted to not be fully compliant with CPAP therapy. He reported panic attacks when he first put the mask on. He admitted not to have used CPAP in over a year. I suggested he return for a sleep study. 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 He was taking Benadryl 50 mg each night. He had been doing this for years. He had a split-night sleep study on 01/24/2014 and went over his test results with him in detail today. Baseline sleep efficiency was 92.9% with a latency to sleep of 2.5 minutes and wake after sleep onset of 4 minutes with no significant fragmentation noted. He had a normal arousal index. He had an increased percentage of stage II sleep, absence of deep sleep and absence of REM sleep. He had PACs and PVCs on EKG and no EEG changes. He had no significant PLMS. His total AHI was highly elevated at 45.2 events per hour. Baseline oxygen saturation was 93% with a nadir of 88%. He was titrated on CPAP during the rest of the test. Sleep efficiency was 90.6%. Arousal index was  normal. He had an increased percentage of stage II sleep, absence of deep sleep and a normal percentage of REM sleep.  Average oxygen saturation was 93% with a nadir of 88%. He had no significant periodic leg movements. He was titrated from 5-8 cm. He had some central apneas on the final pressure of 8 cm. Based on the overall test results I prescribed CPAP at a pressure of 8 cm using large nasal pillows.   I reviewed the patient's CPAP compliance data from 03/19/2014 to 04/17/2014, which is a total of 30 days, during which time the patient used CPAP every day. The average usage for all days was 6 hours and 24 minutes. The percent used days greater than 4 hours was 90 %, indicating excellent compliance. The residual AHI was 6 per hour, indicating a slightly suboptimal treatment pressure of 8 cwp with EPR of 3. Air leak from the mask was low at 7 L per minute at the 95th percentile. He may need an increase in his pressure, I felt.  His Past Medical History Is Significant For: Past Medical History:  Diagnosis Date  . Anxiety   . Cervical spondylosis   . Degenerative disc disease, cervical   . Depression   . Headache(784.0)   . Hypertension   . OA (osteoarthritis)   . OSA (obstructive sleep apnea)    SEVERE OSA PER STUDY 2005--  USES NASAL CANNULA  WITH SETTING AT 12 -- NO MASK  . Parkinson's disease ([place]) NEUROLOGIST--  DR Jim Like   IN A STUDY AT [facility]--- lov note crae everywhere 02-19-2013 dr Kalman Shan scott  . Right ureteral stone   . Spinal stenosis of lumbar region   . Urinary incontinence    SECONDARY TO PARKINSON'S DISEASE  . Wears glasses     His Past Surgical History Is Significant For: Past Surgical History:  Procedure Laterality Date  . CHOLECYSTECTOMY  2000  . CYSTOSCOPY WITH RETROGRADE PYELOGRAM, URETEROSCOPY AND STENT PLACEMENT Right 11/13/2013   Procedure: CYSTOSCOPY WITH RIGHT URETEROSCOPY, Right Retrograde Pyelogram, RIGHT URETERAL STENT PLACEMENT, Basket Stone Extraction;   Surgeon: Ardis Hughs, MD;  Location: [facility];  Service: Urology;  Laterality: Right;  . HIP PINNING,CANNULATED Right 04/13/2013   Procedure: CANNULATED HIP PINNING;  Surgeon: Johnn Hai, MD;  Location: [facility];  Service: Orthopedics;  Laterality: Right;  PERCUTANEOUS CANNULATED RIGHT HIP PINNING   . HOLMIUM LASER APPLICATION Right 09/19/971   Procedure: RIGHT LASER LITHOTRIPSY;  Surgeon: Ardis Hughs, MD;  Location: [facility];  Service: Urology;  Laterality: Right;  . KNEE ARTHROSCOPY W/ DEBRIDEMENT Bilateral RIGHT  04-07-2008/   LEFT 10-13-2005  . LUMBAR LAMINECTOMY  01-05-2010   L2  --  L5  . TOTAL KNEE ARTHROPLASTY Left 03/04/2013   Procedure: LEFT TOTAL KNEE ARTHROPLASTY;  Surgeon: Gearlean Alf, MD;  Location: [facility];  Service: Orthopedics;  Laterality: Left;    His Family History Is Significant For: Family History  Problem Relation Age of Onset  . Cancer Mother   . Parkinson's disease Unknown        H/O  . Alzheimer's disease Unknown        H/O  . ALS Unknown        H/O    His Social History Is Significant For: Social History   Socioeconomic History  . Marital status: Married    Spouse name: [place]  . Number of children: 1  . Years of education: Bachelor's  . Highest education level: None  Social Needs  . Financial resource strain: None  . Food insecurity - worry: None  . Food insecurity -  inability: None  . Transportation needs - medical: None  . Transportation needs - non-medical: None  Occupational History  . Occupation: RETIRED    Employer: NOT EMPLOYED  Tobacco Use  . Smoking status: Never Smoker  . Smokeless tobacco: Never Used  Substance and Sexual Activity  . Alcohol use: No    Alcohol/week: 0.0 oz  . Drug use: No  . Sexual activity: None  Other Topics Concern  . None  Social History Narrative   Patient is married and lives at home with his wife ([place]), has 1 child   Patient is right handed    Education level is Bachelor's   Caffeine consumption is 3 cups daily    His Allergies Are:  No Known Allergies:   His Current Medications Are:  Outpatient Encounter Medications as of 10/12/2017  Medication Sig  . aspirin 81 MG chewable tablet Chew by mouth daily.  Marland Kitchen atorvastatin (LIPITOR) 20 MG tablet Take 1 tablet (20 mg total) by mouth daily at 6 PM.  . carbidopa-levodopa (SINEMET CR) 50-200 MG per tablet Take 1 tablet by mouth at bedtime.  . carbidopa-levodopa (SINEMET IR) 25-100 MG tablet Take 2 tablets by mouth 4 (four) times daily. Take at 6 am, 10 am, 2 pm and 6 pm  . Cholecalciferol (VITAMIN D3) 1000 units CAPS Take by mouth.  . clopidogrel (PLAVIX) 75 MG tablet Take 1 tablet (75 mg total) by mouth daily.  Marland Kitchen lisinopril (PRINIVIL,ZESTRIL) 10 MG tablet Take 1 tablet (10 mg total) by mouth daily.  . Multiple Vitamin (MULTIVITAMIN) tablet Take 1 tablet by mouth daily.  Marland Kitchen oxyCODONE-acetaminophen (PERCOCET) 10-325 MG tablet Take 1 tablet by mouth every 6 (six) hours as needed.  . sertraline (ZOLOFT) 100 MG tablet Take 1.5 tablets by mouth daily.   . tamsulosin (FLOMAX) 0.4 MG CAPS capsule Take 1 capsule (0.4 mg total) by mouth daily.  . traZODone (DESYREL) 50 MG tablet Take 1 tablet (50 mg total) by mouth at bedtime.   No facility-administered encounter medications on file as of 10/12/2017.   :  Review of Systems:  Out of a complete 14 point review of systems, all are reviewed and negative with the exception of these symptoms as listed below:  Review of Systems  Neurological:       Pt presents today to discuss his cpap. Pt is not tolerating the current pressure setting on his cpap. Pt has lost a significant amount of weight.    Objective:  Neurological Exam  Physical Exam Physical Examination:   Vitals:   10/12/17 1306  BP: 111/68  Pulse: 62   General Examination: The patient is a very pleasant 67 y.o. male in no acute distress. He is situated in his wheelchair,  appears frail and deconditioned, has lost weight.   HEENT:Normocephalic, atraumatic, pupils are equal, round and reactive to light and accommodation. Extraocular tracking is quite significantly impaired. He has significant facial masking. He has no significant drooling, speech is moderate to severely hypophonic, mild to moderately dysarthric.   Chest:Clear to auscultation without wheezing, rhonchi or crackles noted.  Heart:S1+S2+0, regular and normal without murmurs, rubs or gallops noted.   Abdomen:Soft, non-tender and non-distended with normal bowel sounds appreciated on auscultation.  Extremities:There is1+ edema in the distal lower extremities.  Skin: Warm and dry without trophic changes noted.  Musculoskeletal: exam revealsno obvious changes.   Neurologically:  Mental status: The patient is awake, alert and oriented in all 4 spheres. Hisimmediate and remote memory, attention, language skills and fund  of knowledge are impaired. He is minimally verbal today. He is slow in thinking and in responding. His wife reports most of his history and answers most of the questions.   (On 01/30/2015: MMSE: 28/30, CDT: 3/4, AFT: 11/min.)   Motor exam: Normal bulk, and global strength of 4 out of 5. He has difficulty with mobility in the lower extremities. Fine motor skills are moderate to severely impaired bilaterally, some lateralization noted on the right. Romberg and gait/posture are not testable safely.  Sensory exam is intact to light touch throughout. No dysmetria or intention tremor is noted.  Assessment and Plan:    In summary, James Schultz a very pleasant 67 year old male with an underlying complex medical history of Parkinson's disease (diagnosed in 2007), depression, anxiety, lumbar spinal stenosis, degenerative disc disease, kidney stone, osteoarthritis, status post left total knee replacement, status post right hip surgery, obesity, sleep apnea, stroke, TIA,  chronic pain, and OSA, who presents for follow-up consultation of his obstructive sleep apnea. He has not been able to use CPAP. The pressure feels too high in the mask does not always fit, nasal pillows cause air leakage from the mouth. He has lost over 50 pounds since his original sleep apnea diagnosis some nearly 4 years ago. He has become more frail and deconditioned. We will proceed with a home sleep test to revisit the sleep apnea diagnosis. In the meantime will reduce the pressure to 6 cm. We will see him in follow-up routinely in one year. We talked today about the challenges of advancing Parkinson's disease, cognitive decline, physical deconditioning and the importance of continuing to try to pursue good hydration, proper nutrition with supplementation if needed. I answered all their questions today and the patient and his wife were in agreement. I spent 25 minutes in total face-to-face time with the patient, more than 50% of which was spent in counseling and coordination of care, reviewing test results, reviewing medication and discussing or reviewing the diagnosis of OSA and PD, the prognosis and treatment options. Pertinent laboratory and imaging test results that were available during this visit with the patient were reviewed by me and considered in my medical decision making (see chart for details).

## 2017-10-31 ENCOUNTER — Telehealth: Payer: Self-pay | Admitting: Neurology

## 2017-10-31 DIAGNOSIS — I639 Cerebral infarction, unspecified: Secondary | ICD-10-CM | POA: Diagnosis not present

## 2017-10-31 DIAGNOSIS — G20 Parkinson's disease: Secondary | ICD-10-CM | POA: Diagnosis not present

## 2017-10-31 DIAGNOSIS — F0280 Dementia in other diseases classified elsewhere without behavioral disturbance: Secondary | ICD-10-CM | POA: Diagnosis not present

## 2017-10-31 DIAGNOSIS — G3183 Dementia with Lewy bodies: Secondary | ICD-10-CM | POA: Diagnosis not present

## 2017-10-31 DIAGNOSIS — I10 Essential (primary) hypertension: Secondary | ICD-10-CM | POA: Diagnosis not present

## 2017-10-31 DIAGNOSIS — F321 Major depressive disorder, single episode, moderate: Secondary | ICD-10-CM | POA: Diagnosis not present

## 2017-10-31 DIAGNOSIS — Z299 Encounter for prophylactic measures, unspecified: Secondary | ICD-10-CM | POA: Diagnosis not present

## 2017-10-31 NOTE — Telephone Encounter (Signed)
Pt's wife called and cancelled hst. She stated that the pt has had two strokes and she doesn't not think the pt will leave the device on to get an accurate read.

## 2017-11-28 DIAGNOSIS — Z9889 Other specified postprocedural states: Secondary | ICD-10-CM | POA: Diagnosis not present

## 2017-11-28 DIAGNOSIS — I700 Atherosclerosis of aorta: Secondary | ICD-10-CM | POA: Diagnosis not present

## 2017-11-28 DIAGNOSIS — M47816 Spondylosis without myelopathy or radiculopathy, lumbar region: Secondary | ICD-10-CM | POA: Diagnosis not present

## 2017-11-28 DIAGNOSIS — M545 Low back pain: Secondary | ICD-10-CM | POA: Diagnosis not present

## 2017-11-28 DIAGNOSIS — M5134 Other intervertebral disc degeneration, thoracic region: Secondary | ICD-10-CM | POA: Diagnosis not present

## 2017-11-28 DIAGNOSIS — M419 Scoliosis, unspecified: Secondary | ICD-10-CM | POA: Diagnosis not present

## 2017-11-30 DIAGNOSIS — M4716 Other spondylosis with myelopathy, lumbar region: Secondary | ICD-10-CM | POA: Diagnosis not present

## 2017-12-01 DIAGNOSIS — R451 Restlessness and agitation: Secondary | ICD-10-CM | POA: Diagnosis not present

## 2017-12-01 DIAGNOSIS — R410 Disorientation, unspecified: Secondary | ICD-10-CM | POA: Diagnosis not present

## 2017-12-01 DIAGNOSIS — Z7902 Long term (current) use of antithrombotics/antiplatelets: Secondary | ICD-10-CM | POA: Diagnosis not present

## 2017-12-01 DIAGNOSIS — R262 Difficulty in walking, not elsewhere classified: Secondary | ICD-10-CM | POA: Diagnosis not present

## 2017-12-01 DIAGNOSIS — N400 Enlarged prostate without lower urinary tract symptoms: Secondary | ICD-10-CM | POA: Diagnosis not present

## 2017-12-01 DIAGNOSIS — R531 Weakness: Secondary | ICD-10-CM | POA: Diagnosis not present

## 2017-12-01 DIAGNOSIS — Z9049 Acquired absence of other specified parts of digestive tract: Secondary | ICD-10-CM | POA: Diagnosis not present

## 2017-12-01 DIAGNOSIS — M4802 Spinal stenosis, cervical region: Secondary | ICD-10-CM | POA: Diagnosis not present

## 2017-12-01 DIAGNOSIS — I10 Essential (primary) hypertension: Secondary | ICD-10-CM | POA: Diagnosis not present

## 2017-12-01 DIAGNOSIS — G20 Parkinson's disease: Secondary | ICD-10-CM | POA: Diagnosis not present

## 2017-12-01 DIAGNOSIS — Z8249 Family history of ischemic heart disease and other diseases of the circulatory system: Secondary | ICD-10-CM | POA: Diagnosis not present

## 2017-12-01 DIAGNOSIS — R4182 Altered mental status, unspecified: Secondary | ICD-10-CM | POA: Diagnosis not present

## 2017-12-01 DIAGNOSIS — Z818 Family history of other mental and behavioral disorders: Secondary | ICD-10-CM | POA: Diagnosis not present

## 2017-12-01 DIAGNOSIS — R0602 Shortness of breath: Secondary | ICD-10-CM | POA: Diagnosis not present

## 2017-12-01 DIAGNOSIS — Z9181 History of falling: Secondary | ICD-10-CM | POA: Diagnosis not present

## 2017-12-01 DIAGNOSIS — F329 Major depressive disorder, single episode, unspecified: Secondary | ICD-10-CM | POA: Diagnosis not present

## 2017-12-01 DIAGNOSIS — Z8673 Personal history of transient ischemic attack (TIA), and cerebral infarction without residual deficits: Secondary | ICD-10-CM | POA: Diagnosis not present

## 2017-12-01 DIAGNOSIS — F0280 Dementia in other diseases classified elsewhere without behavioral disturbance: Secondary | ICD-10-CM | POA: Diagnosis not present

## 2017-12-01 DIAGNOSIS — Z79899 Other long term (current) drug therapy: Secondary | ICD-10-CM | POA: Diagnosis not present

## 2017-12-02 DIAGNOSIS — F0280 Dementia in other diseases classified elsewhere without behavioral disturbance: Secondary | ICD-10-CM | POA: Diagnosis not present

## 2017-12-02 DIAGNOSIS — R4182 Altered mental status, unspecified: Secondary | ICD-10-CM | POA: Diagnosis not present

## 2017-12-02 DIAGNOSIS — G20 Parkinson's disease: Secondary | ICD-10-CM | POA: Diagnosis not present

## 2017-12-02 DIAGNOSIS — R531 Weakness: Secondary | ICD-10-CM | POA: Diagnosis not present

## 2017-12-03 DIAGNOSIS — R531 Weakness: Secondary | ICD-10-CM | POA: Diagnosis not present

## 2017-12-03 DIAGNOSIS — F0280 Dementia in other diseases classified elsewhere without behavioral disturbance: Secondary | ICD-10-CM | POA: Diagnosis not present

## 2017-12-03 DIAGNOSIS — R4182 Altered mental status, unspecified: Secondary | ICD-10-CM | POA: Diagnosis not present

## 2017-12-03 DIAGNOSIS — G20 Parkinson's disease: Secondary | ICD-10-CM | POA: Diagnosis not present

## 2017-12-04 DIAGNOSIS — F0280 Dementia in other diseases classified elsewhere without behavioral disturbance: Secondary | ICD-10-CM | POA: Diagnosis not present

## 2017-12-04 DIAGNOSIS — R4182 Altered mental status, unspecified: Secondary | ICD-10-CM | POA: Diagnosis not present

## 2017-12-04 DIAGNOSIS — G20 Parkinson's disease: Secondary | ICD-10-CM | POA: Diagnosis not present

## 2017-12-04 DIAGNOSIS — R531 Weakness: Secondary | ICD-10-CM | POA: Diagnosis not present

## 2018-01-22 DIAGNOSIS — R350 Frequency of micturition: Secondary | ICD-10-CM | POA: Diagnosis not present

## 2018-02-01 DIAGNOSIS — R443 Hallucinations, unspecified: Secondary | ICD-10-CM | POA: Diagnosis not present

## 2018-02-01 DIAGNOSIS — G20 Parkinson's disease: Secondary | ICD-10-CM | POA: Diagnosis not present

## 2018-03-07 DIAGNOSIS — M4716 Other spondylosis with myelopathy, lumbar region: Secondary | ICD-10-CM | POA: Diagnosis not present

## 2018-03-14 DIAGNOSIS — M4186 Other forms of scoliosis, lumbar region: Secondary | ICD-10-CM | POA: Diagnosis not present

## 2018-03-14 DIAGNOSIS — M48061 Spinal stenosis, lumbar region without neurogenic claudication: Secondary | ICD-10-CM | POA: Diagnosis not present

## 2018-03-14 DIAGNOSIS — M4716 Other spondylosis with myelopathy, lumbar region: Secondary | ICD-10-CM | POA: Diagnosis not present

## 2018-03-14 DIAGNOSIS — Z9889 Other specified postprocedural states: Secondary | ICD-10-CM | POA: Diagnosis not present

## 2018-03-14 DIAGNOSIS — N200 Calculus of kidney: Secondary | ICD-10-CM | POA: Diagnosis not present

## 2018-04-02 IMAGING — CR DG CHEST 1V
1 series · 1 of 1 positions shown · non-contrast
Comparison: 02/08/2016

CLINICAL DATA: TIA

EXAM:
CHEST 1 VIEW

[chest ap]
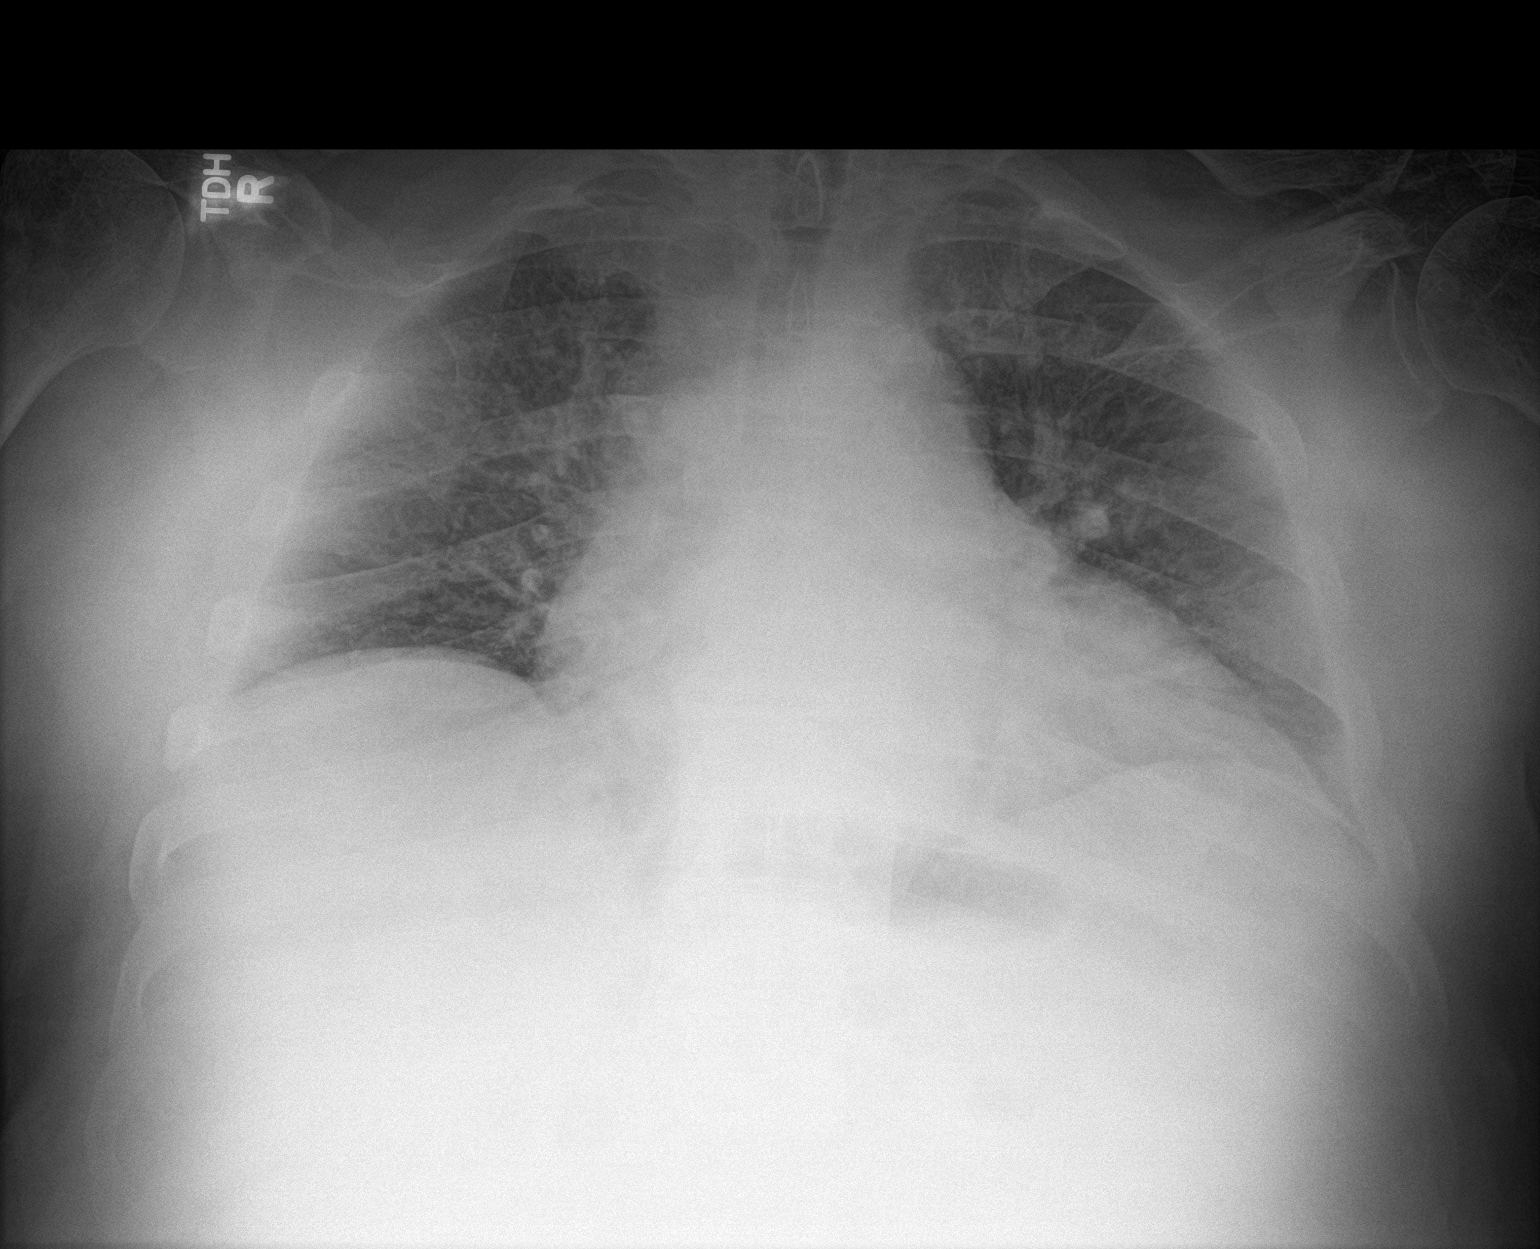

[1 of 1 positions shown; findings below may reference images not displayed]

FINDINGS: Cardiac enlargement with pulmonary vascular congestion. No edema or
effusion

Hypoventilation with mild bibasilar atelectasis
IMPRESSION: Pulmonary vascular congestion

Hypoventilation

## 2018-04-02 IMAGING — MR MR HEAD W/O CM
9 of 11 series · 29 of 48 positions shown · non-contrast
Comparison: CT HEAD April 08, 2016

CLINICAL DATA: Syncopal episode, LEFT facial droop. History of
hypertension, Parkinson's disease.

EXAM:
MRI HEAD WITHOUT CONTRAST
MRA HEAD WITHOUT CONTRAST
TECHNIQUE: Multiplanar, multiecho pulse sequences of the brain and surrounding
structures were obtained without intravenous contrast. Angiographic
images of the head were obtained using MRA technique without
contrast.

[Series 2: FLAIR · sagittal · 5.0mm · 0.49mm/px · 2 of 29 slices shown (1 of 2)]
[im 1/29]
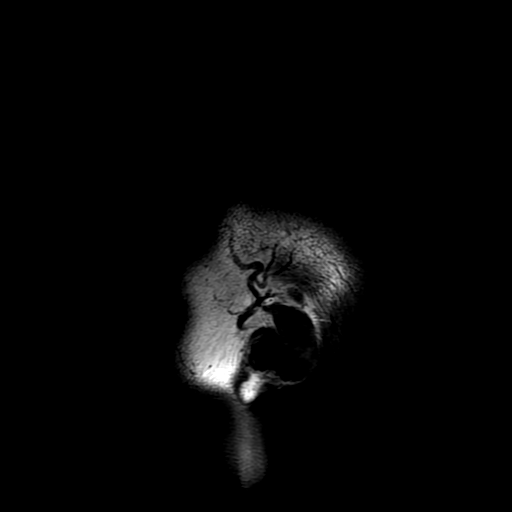
[im 29/29]
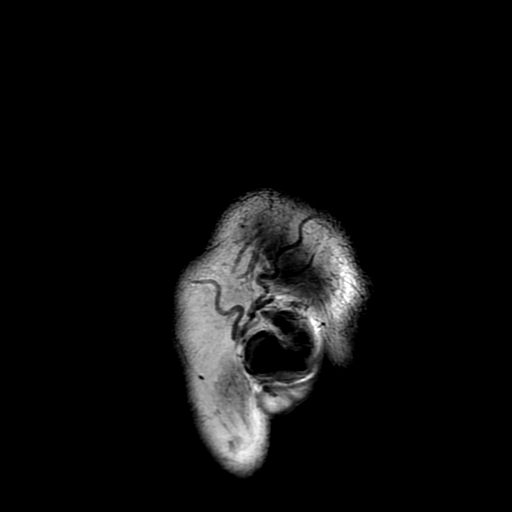

[Series 4: DWI · axial · 3.0mm · 0.98mm/px · z∈[-29,+129]mm · 7 of 112 slices shown (1 of 2)]
[im 1/112]
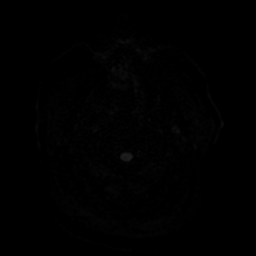
[im 19/112]
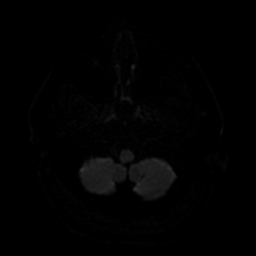
[im 38/112]
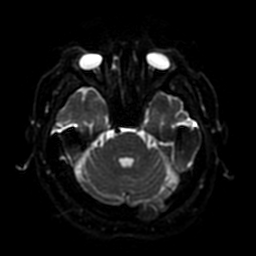
[im 56/112]
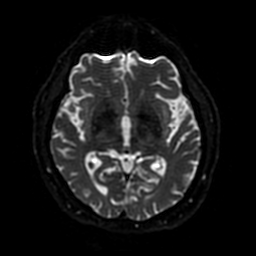
[im 75/112]
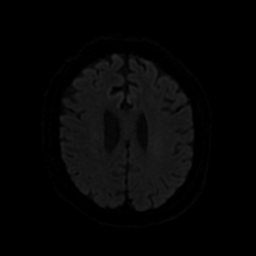
[im 93/112]
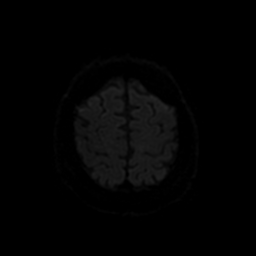
[im 112/112]
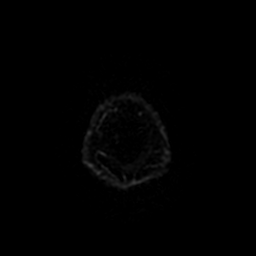

[Series 5: DWI · coronal · 4.0mm · 0.94mm/px · 4 of 73 slices shown (2 of 2)]
[im 1/73]
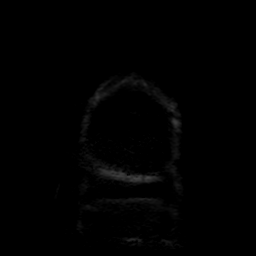
[im 25/73]
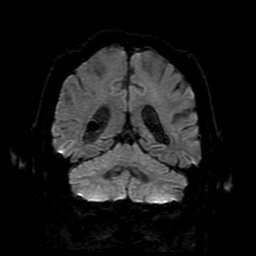
[im 49/73]
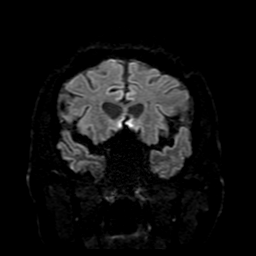
[im 73/73]
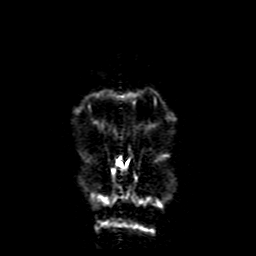

[Series 6: ax (id) 2 · axial · 1.0mm · 0.43mm/px · z∈[-75,-13]mm · 5 of 232 slices shown]
[im 1/232]
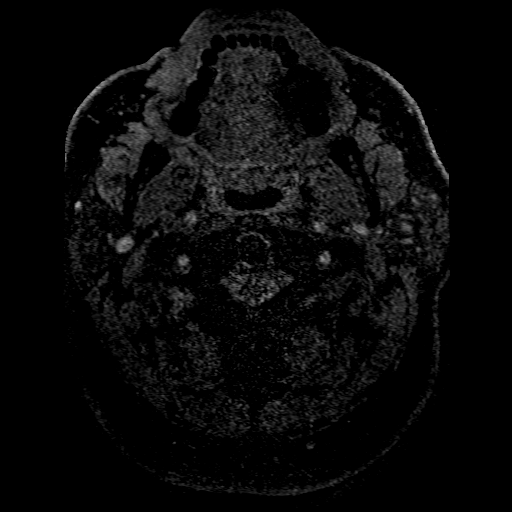
[im 36/232]
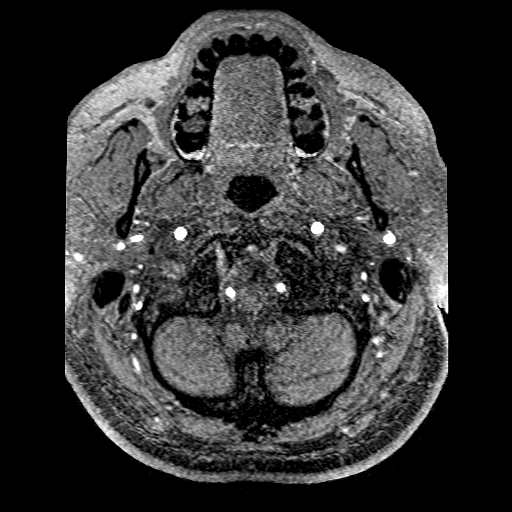
[im 72/232]
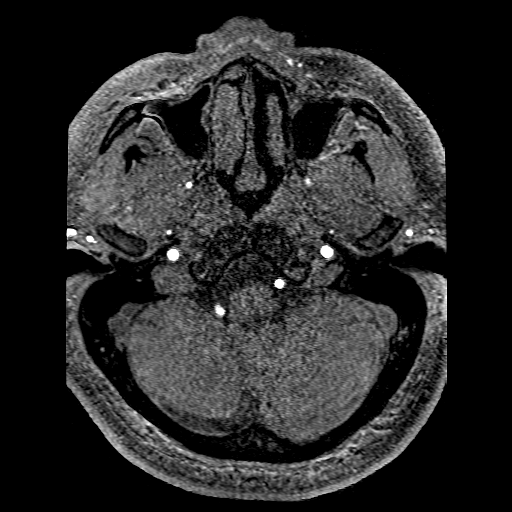
[im 107/232]
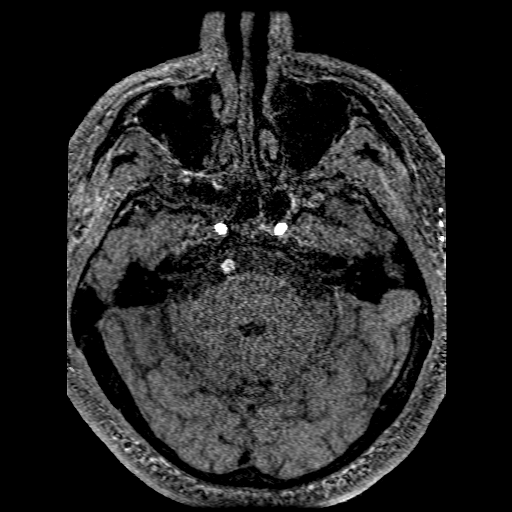
[im 125/232]
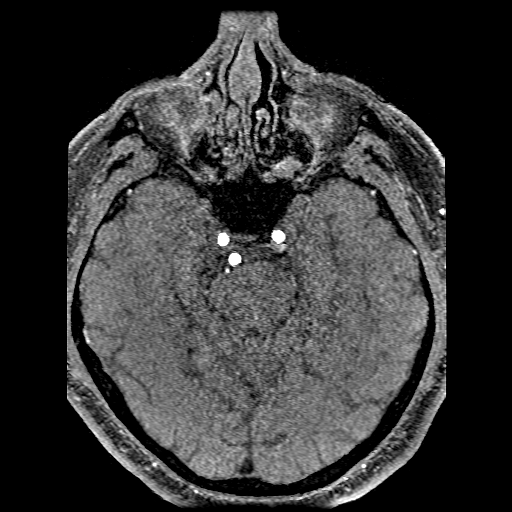

[Series 7: T2 · axial · 5.0mm · 0.47mm/px · z∈[-29,+126]mm · 2 of 28 slices shown (1 of 2)]
[im 1/28]
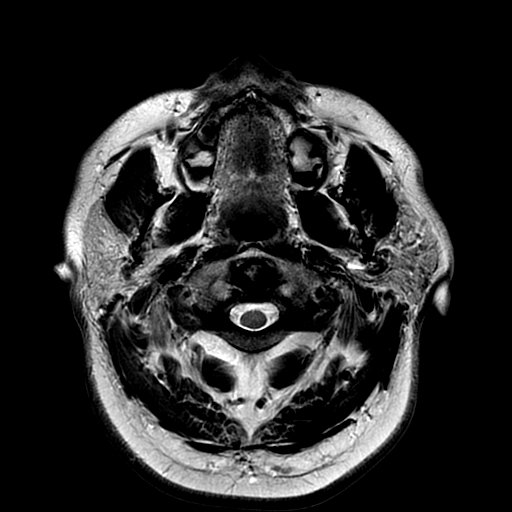
[im 28/28]
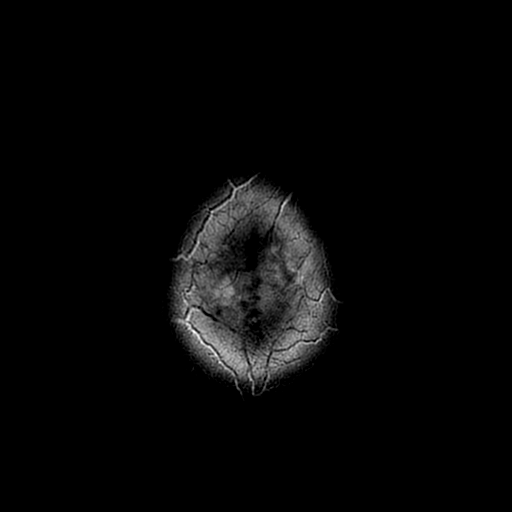

[Series 8: FLAIR · axial · 5.0mm · 0.47mm/px · z∈[-29,+126]mm · 2 of 28 slices shown (2 of 2)]
[im 1/28]
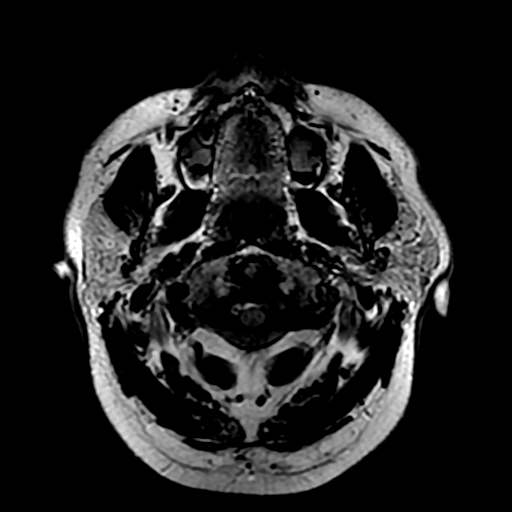
[im 28/28]
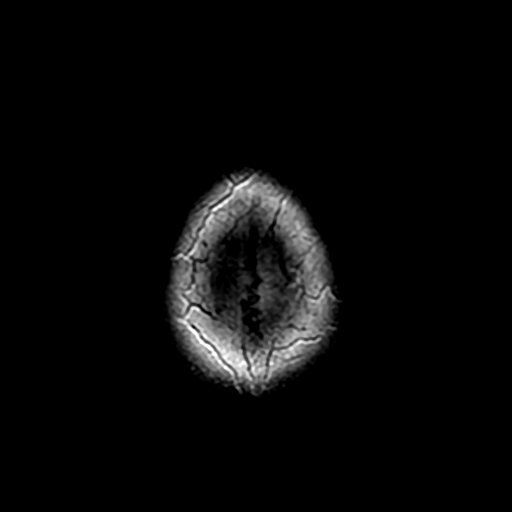

[Series 11: T2 · coronal · 5.0mm · 0.39mm/px · 2 of 31 slices shown (2 of 2)]
[im 1/31]
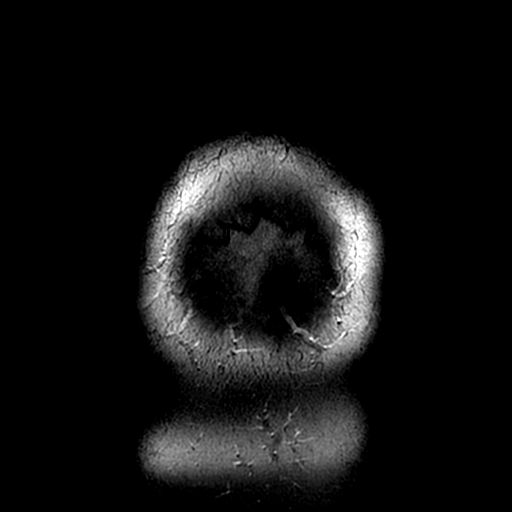
[im 31/31]
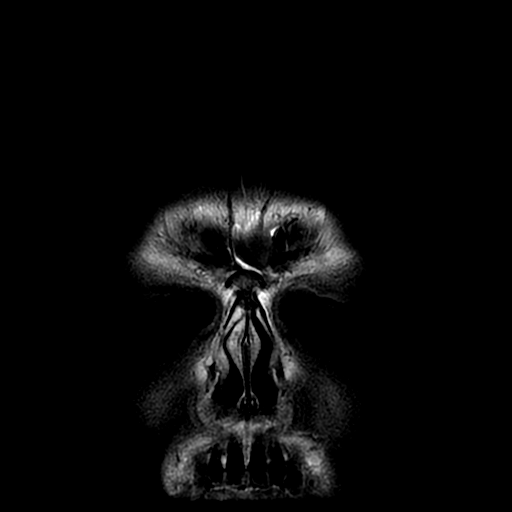

[Series 450: ADC · axial · 3.0mm · 0.98mm/px · z∈[-29,+129]mm · 3 of 56 slices shown (1 of 2)]
[im 1/56]
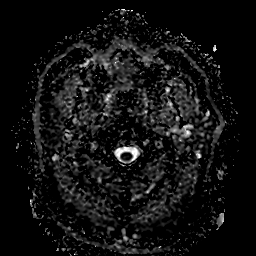
[im 28/56]
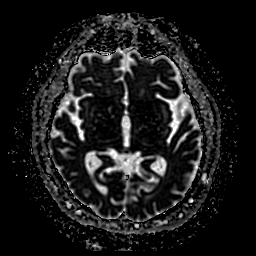
[im 56/56]
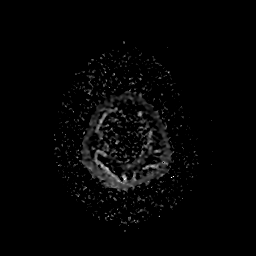

[Series 550: ADC · coronal · 4.0mm · 0.94mm/px · 2 of 37 slices shown (2 of 2)]
[im 1/37]
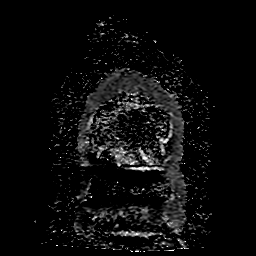
[im 37/37]
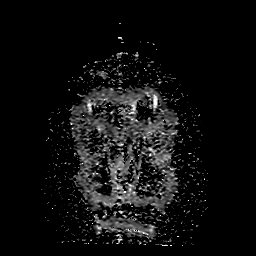

[29 of 48 positions shown; findings below may reference images not displayed]

FINDINGS: MRI HEAD FINDINGS

INTRACRANIAL CONTENTS: 14 x 16 mm reduced diffusion RIGHT pons with
low ADC value in faint FLAIR T2 hyperintense signal. Mild
ventriculomegaly on the basis of global parenchymal brain volume
loss. Scattered sub cm supratentorial white matter FLAIR T2
hyperintensities are within normal range for patient's age. No
midline shift, mass effect or masses. No extra-axial masses though,
contrast enhanced sequences would be more sensitive. Dolicoectatic
major intracranial vascular flow voids present at skull base.

ORBITS: The included ocular globes and orbital contents are
non-suspicious. Status post bilateral ocular lens implants.

SINUSES: Mild paranasal sinus mucosal thickening with RIGHT sphenoid
sinus air-fluid level. Mastoid air cells are well aerated.

SKULL/SOFT TISSUES: No abnormal sellar expansion. No suspicious
calvarial bone marrow signal. Craniocervical junction maintained.

MRA HEAD FINDINGS

ANTERIOR CIRCULATION: Normal flow related enhancement of the
included cervical, petrous, cavernous and supraclinoid internal
carotid arteries. Patent anterior communicating artery. Normal flow
related enhancement of the anterior and middle cerebral arteries,
including distal segments.

Dolichoectasia. No large vessel occlusion, high-grade stenosis,
abnormal luminal irregularity, aneurysm.

POSTERIOR CIRCULATION: Codominant vertebral artery's. Basilar artery
is patent, decreased flow related enhancement with normal caliber
compatible with slow flow. Normal flow related enhancement of the
main branch vessels. Normal flow related enhancement of the
posterior cerebral arteries.

Dolichoectasia. No large vessel occlusion, high-grade stenosis,
abnormal luminal irregularity, aneurysm.
IMPRESSION: MRI HEAD: 14 x 16 mm acute RIGHT pontine infarct.

Mild global parenchymal brain volume loss for age. Mild chronic
small vessel ischemic disease.

MRA HEAD: No emergent large vessel occlusion or high-grade stenosis.

Dolichoectasia compatible chronic hypertension.

## 2018-04-05 DIAGNOSIS — I10 Essential (primary) hypertension: Secondary | ICD-10-CM | POA: Diagnosis not present

## 2018-04-05 DIAGNOSIS — G20 Parkinson's disease: Secondary | ICD-10-CM | POA: Diagnosis not present

## 2018-04-05 DIAGNOSIS — G4733 Obstructive sleep apnea (adult) (pediatric): Secondary | ICD-10-CM | POA: Diagnosis not present

## 2018-04-05 DIAGNOSIS — Z299 Encounter for prophylactic measures, unspecified: Secondary | ICD-10-CM | POA: Diagnosis not present

## 2018-04-05 DIAGNOSIS — Z6826 Body mass index (BMI) 26.0-26.9, adult: Secondary | ICD-10-CM | POA: Diagnosis not present

## 2018-04-05 DIAGNOSIS — R410 Disorientation, unspecified: Secondary | ICD-10-CM | POA: Diagnosis not present

## 2018-05-04 DIAGNOSIS — I10 Essential (primary) hypertension: Secondary | ICD-10-CM | POA: Diagnosis not present

## 2018-05-04 DIAGNOSIS — G3183 Dementia with Lewy bodies: Secondary | ICD-10-CM | POA: Diagnosis not present

## 2018-05-04 DIAGNOSIS — G20 Parkinson's disease: Secondary | ICD-10-CM | POA: Diagnosis not present

## 2018-05-04 DIAGNOSIS — F321 Major depressive disorder, single episode, moderate: Secondary | ICD-10-CM | POA: Diagnosis not present

## 2018-05-04 DIAGNOSIS — Z6826 Body mass index (BMI) 26.0-26.9, adult: Secondary | ICD-10-CM | POA: Diagnosis not present

## 2018-05-04 DIAGNOSIS — Z299 Encounter for prophylactic measures, unspecified: Secondary | ICD-10-CM | POA: Diagnosis not present

## 2018-05-04 DIAGNOSIS — F0280 Dementia in other diseases classified elsewhere without behavioral disturbance: Secondary | ICD-10-CM | POA: Diagnosis not present

## 2018-05-04 DIAGNOSIS — R5383 Other fatigue: Secondary | ICD-10-CM | POA: Diagnosis not present

## 2018-05-04 DIAGNOSIS — Z79899 Other long term (current) drug therapy: Secondary | ICD-10-CM | POA: Diagnosis not present

## 2018-05-04 DIAGNOSIS — E7800 Pure hypercholesterolemia, unspecified: Secondary | ICD-10-CM | POA: Diagnosis not present

## 2018-07-03 DIAGNOSIS — H109 Unspecified conjunctivitis: Secondary | ICD-10-CM | POA: Diagnosis not present

## 2018-07-03 DIAGNOSIS — Z6825 Body mass index (BMI) 25.0-25.9, adult: Secondary | ICD-10-CM | POA: Diagnosis not present

## 2018-07-03 DIAGNOSIS — F321 Major depressive disorder, single episode, moderate: Secondary | ICD-10-CM | POA: Diagnosis not present

## 2018-07-03 DIAGNOSIS — Z299 Encounter for prophylactic measures, unspecified: Secondary | ICD-10-CM | POA: Diagnosis not present

## 2018-07-03 DIAGNOSIS — Z0000 Encounter for general adult medical examination without abnormal findings: Secondary | ICD-10-CM | POA: Diagnosis not present

## 2018-07-03 DIAGNOSIS — Z1331 Encounter for screening for depression: Secondary | ICD-10-CM | POA: Diagnosis not present

## 2018-07-03 DIAGNOSIS — Z7189 Other specified counseling: Secondary | ICD-10-CM | POA: Diagnosis not present

## 2018-07-03 DIAGNOSIS — Z1339 Encounter for screening examination for other mental health and behavioral disorders: Secondary | ICD-10-CM | POA: Diagnosis not present

## 2018-07-03 DIAGNOSIS — I10 Essential (primary) hypertension: Secondary | ICD-10-CM | POA: Diagnosis not present

## 2018-08-01 DIAGNOSIS — M546 Pain in thoracic spine: Secondary | ICD-10-CM | POA: Diagnosis not present

## 2018-08-01 DIAGNOSIS — M4716 Other spondylosis with myelopathy, lumbar region: Secondary | ICD-10-CM | POA: Diagnosis not present

## 2018-08-01 DIAGNOSIS — M545 Low back pain: Secondary | ICD-10-CM | POA: Diagnosis not present

## 2018-08-13 DIAGNOSIS — I10 Essential (primary) hypertension: Secondary | ICD-10-CM | POA: Diagnosis not present

## 2018-08-13 DIAGNOSIS — G20 Parkinson's disease: Secondary | ICD-10-CM | POA: Diagnosis not present

## 2018-08-13 DIAGNOSIS — Z299 Encounter for prophylactic measures, unspecified: Secondary | ICD-10-CM | POA: Diagnosis not present

## 2018-08-13 DIAGNOSIS — Z6822 Body mass index (BMI) 22.0-22.9, adult: Secondary | ICD-10-CM | POA: Diagnosis not present

## 2018-08-13 DIAGNOSIS — E860 Dehydration: Secondary | ICD-10-CM | POA: Diagnosis not present

## 2018-08-13 DIAGNOSIS — F321 Major depressive disorder, single episode, moderate: Secondary | ICD-10-CM | POA: Diagnosis not present

## 2018-08-22 DIAGNOSIS — R110 Nausea: Secondary | ICD-10-CM | POA: Diagnosis not present

## 2018-08-22 DIAGNOSIS — G20 Parkinson's disease: Secondary | ICD-10-CM | POA: Diagnosis not present

## 2018-09-10 DIAGNOSIS — R443 Hallucinations, unspecified: Secondary | ICD-10-CM | POA: Diagnosis not present

## 2018-09-10 DIAGNOSIS — R451 Restlessness and agitation: Secondary | ICD-10-CM | POA: Diagnosis not present

## 2018-09-10 DIAGNOSIS — G20 Parkinson's disease: Secondary | ICD-10-CM | POA: Diagnosis not present

## 2018-09-11 DIAGNOSIS — R413 Other amnesia: Secondary | ICD-10-CM | POA: Diagnosis not present

## 2018-09-11 DIAGNOSIS — G20 Parkinson's disease: Secondary | ICD-10-CM | POA: Diagnosis not present

## 2018-09-11 DIAGNOSIS — R441 Visual hallucinations: Secondary | ICD-10-CM | POA: Diagnosis not present

## 2018-09-11 DIAGNOSIS — R451 Restlessness and agitation: Secondary | ICD-10-CM | POA: Diagnosis not present

## 2018-09-11 DIAGNOSIS — I10 Essential (primary) hypertension: Secondary | ICD-10-CM | POA: Diagnosis not present

## 2018-09-11 DIAGNOSIS — R440 Auditory hallucinations: Secondary | ICD-10-CM | POA: Diagnosis not present

## 2018-09-11 DIAGNOSIS — G4730 Sleep apnea, unspecified: Secondary | ICD-10-CM | POA: Diagnosis not present

## 2018-09-11 DIAGNOSIS — R634 Abnormal weight loss: Secondary | ICD-10-CM | POA: Diagnosis not present

## 2018-09-11 DIAGNOSIS — Z8673 Personal history of transient ischemic attack (TIA), and cerebral infarction without residual deficits: Secondary | ICD-10-CM | POA: Diagnosis not present

## 2018-09-11 DIAGNOSIS — F329 Major depressive disorder, single episode, unspecified: Secondary | ICD-10-CM | POA: Diagnosis not present

## 2018-09-11 DIAGNOSIS — I959 Hypotension, unspecified: Secondary | ICD-10-CM | POA: Diagnosis not present

## 2018-09-12 DIAGNOSIS — G20 Parkinson's disease: Secondary | ICD-10-CM | POA: Diagnosis not present

## 2018-09-12 DIAGNOSIS — R634 Abnormal weight loss: Secondary | ICD-10-CM | POA: Diagnosis not present

## 2018-09-12 DIAGNOSIS — R451 Restlessness and agitation: Secondary | ICD-10-CM | POA: Diagnosis not present

## 2018-09-12 DIAGNOSIS — R440 Auditory hallucinations: Secondary | ICD-10-CM | POA: Diagnosis not present

## 2018-09-12 DIAGNOSIS — R413 Other amnesia: Secondary | ICD-10-CM | POA: Diagnosis not present

## 2018-09-12 DIAGNOSIS — R441 Visual hallucinations: Secondary | ICD-10-CM | POA: Diagnosis not present

## 2018-09-13 DIAGNOSIS — R634 Abnormal weight loss: Secondary | ICD-10-CM | POA: Diagnosis not present

## 2018-09-13 DIAGNOSIS — R441 Visual hallucinations: Secondary | ICD-10-CM | POA: Diagnosis not present

## 2018-09-13 DIAGNOSIS — R440 Auditory hallucinations: Secondary | ICD-10-CM | POA: Diagnosis not present

## 2018-09-13 DIAGNOSIS — R451 Restlessness and agitation: Secondary | ICD-10-CM | POA: Diagnosis not present

## 2018-09-13 DIAGNOSIS — G20 Parkinson's disease: Secondary | ICD-10-CM | POA: Diagnosis not present

## 2018-09-13 DIAGNOSIS — R413 Other amnesia: Secondary | ICD-10-CM | POA: Diagnosis not present

## 2018-09-15 DIAGNOSIS — R441 Visual hallucinations: Secondary | ICD-10-CM | POA: Diagnosis not present

## 2018-09-15 DIAGNOSIS — G4730 Sleep apnea, unspecified: Secondary | ICD-10-CM | POA: Diagnosis not present

## 2018-09-15 DIAGNOSIS — Z8673 Personal history of transient ischemic attack (TIA), and cerebral infarction without residual deficits: Secondary | ICD-10-CM | POA: Diagnosis not present

## 2018-09-15 DIAGNOSIS — G20 Parkinson's disease: Secondary | ICD-10-CM | POA: Diagnosis not present

## 2018-09-15 DIAGNOSIS — R451 Restlessness and agitation: Secondary | ICD-10-CM | POA: Diagnosis not present

## 2018-09-15 DIAGNOSIS — I959 Hypotension, unspecified: Secondary | ICD-10-CM | POA: Diagnosis not present

## 2018-09-15 DIAGNOSIS — R440 Auditory hallucinations: Secondary | ICD-10-CM | POA: Diagnosis not present

## 2018-09-15 DIAGNOSIS — I10 Essential (primary) hypertension: Secondary | ICD-10-CM | POA: Diagnosis not present

## 2018-09-15 DIAGNOSIS — F329 Major depressive disorder, single episode, unspecified: Secondary | ICD-10-CM | POA: Diagnosis not present

## 2018-09-15 DIAGNOSIS — R413 Other amnesia: Secondary | ICD-10-CM | POA: Diagnosis not present

## 2018-09-15 DIAGNOSIS — R634 Abnormal weight loss: Secondary | ICD-10-CM | POA: Diagnosis not present

## 2018-09-17 DIAGNOSIS — R634 Abnormal weight loss: Secondary | ICD-10-CM | POA: Diagnosis not present

## 2018-09-17 DIAGNOSIS — R451 Restlessness and agitation: Secondary | ICD-10-CM | POA: Diagnosis not present

## 2018-09-17 DIAGNOSIS — R441 Visual hallucinations: Secondary | ICD-10-CM | POA: Diagnosis not present

## 2018-09-17 DIAGNOSIS — R440 Auditory hallucinations: Secondary | ICD-10-CM | POA: Diagnosis not present

## 2018-09-17 DIAGNOSIS — G20 Parkinson's disease: Secondary | ICD-10-CM | POA: Diagnosis not present

## 2018-09-17 DIAGNOSIS — R413 Other amnesia: Secondary | ICD-10-CM | POA: Diagnosis not present

## 2018-09-18 DIAGNOSIS — R441 Visual hallucinations: Secondary | ICD-10-CM | POA: Diagnosis not present

## 2018-09-18 DIAGNOSIS — R413 Other amnesia: Secondary | ICD-10-CM | POA: Diagnosis not present

## 2018-09-18 DIAGNOSIS — R440 Auditory hallucinations: Secondary | ICD-10-CM | POA: Diagnosis not present

## 2018-09-18 DIAGNOSIS — R634 Abnormal weight loss: Secondary | ICD-10-CM | POA: Diagnosis not present

## 2018-09-18 DIAGNOSIS — R451 Restlessness and agitation: Secondary | ICD-10-CM | POA: Diagnosis not present

## 2018-09-18 DIAGNOSIS — G20 Parkinson's disease: Secondary | ICD-10-CM | POA: Diagnosis not present

## 2018-09-19 DIAGNOSIS — R451 Restlessness and agitation: Secondary | ICD-10-CM | POA: Diagnosis not present

## 2018-09-19 DIAGNOSIS — R413 Other amnesia: Secondary | ICD-10-CM | POA: Diagnosis not present

## 2018-09-19 DIAGNOSIS — G20 Parkinson's disease: Secondary | ICD-10-CM | POA: Diagnosis not present

## 2018-09-19 DIAGNOSIS — R440 Auditory hallucinations: Secondary | ICD-10-CM | POA: Diagnosis not present

## 2018-09-19 DIAGNOSIS — R441 Visual hallucinations: Secondary | ICD-10-CM | POA: Diagnosis not present

## 2018-09-19 DIAGNOSIS — R634 Abnormal weight loss: Secondary | ICD-10-CM | POA: Diagnosis not present

## 2018-09-20 DIAGNOSIS — R440 Auditory hallucinations: Secondary | ICD-10-CM | POA: Diagnosis not present

## 2018-09-20 DIAGNOSIS — R441 Visual hallucinations: Secondary | ICD-10-CM | POA: Diagnosis not present

## 2018-09-20 DIAGNOSIS — R413 Other amnesia: Secondary | ICD-10-CM | POA: Diagnosis not present

## 2018-09-20 DIAGNOSIS — R634 Abnormal weight loss: Secondary | ICD-10-CM | POA: Diagnosis not present

## 2018-09-20 DIAGNOSIS — R451 Restlessness and agitation: Secondary | ICD-10-CM | POA: Diagnosis not present

## 2018-09-20 DIAGNOSIS — G20 Parkinson's disease: Secondary | ICD-10-CM | POA: Diagnosis not present

## 2018-09-21 DIAGNOSIS — R634 Abnormal weight loss: Secondary | ICD-10-CM | POA: Diagnosis not present

## 2018-09-21 DIAGNOSIS — G20 Parkinson's disease: Secondary | ICD-10-CM | POA: Diagnosis not present

## 2018-09-21 DIAGNOSIS — R440 Auditory hallucinations: Secondary | ICD-10-CM | POA: Diagnosis not present

## 2018-09-21 DIAGNOSIS — R413 Other amnesia: Secondary | ICD-10-CM | POA: Diagnosis not present

## 2018-09-21 DIAGNOSIS — R441 Visual hallucinations: Secondary | ICD-10-CM | POA: Diagnosis not present

## 2018-09-21 DIAGNOSIS — R451 Restlessness and agitation: Secondary | ICD-10-CM | POA: Diagnosis not present

## 2018-09-22 DIAGNOSIS — R634 Abnormal weight loss: Secondary | ICD-10-CM | POA: Diagnosis not present

## 2018-09-22 DIAGNOSIS — G20 Parkinson's disease: Secondary | ICD-10-CM | POA: Diagnosis not present

## 2018-09-22 DIAGNOSIS — R413 Other amnesia: Secondary | ICD-10-CM | POA: Diagnosis not present

## 2018-09-22 DIAGNOSIS — R451 Restlessness and agitation: Secondary | ICD-10-CM | POA: Diagnosis not present

## 2018-09-22 DIAGNOSIS — R441 Visual hallucinations: Secondary | ICD-10-CM | POA: Diagnosis not present

## 2018-09-22 DIAGNOSIS — R440 Auditory hallucinations: Secondary | ICD-10-CM | POA: Diagnosis not present

## 2018-09-23 DIAGNOSIS — R451 Restlessness and agitation: Secondary | ICD-10-CM | POA: Diagnosis not present

## 2018-09-23 DIAGNOSIS — R441 Visual hallucinations: Secondary | ICD-10-CM | POA: Diagnosis not present

## 2018-09-23 DIAGNOSIS — R634 Abnormal weight loss: Secondary | ICD-10-CM | POA: Diagnosis not present

## 2018-09-23 DIAGNOSIS — R413 Other amnesia: Secondary | ICD-10-CM | POA: Diagnosis not present

## 2018-09-23 DIAGNOSIS — G20 Parkinson's disease: Secondary | ICD-10-CM | POA: Diagnosis not present

## 2018-09-23 DIAGNOSIS — R440 Auditory hallucinations: Secondary | ICD-10-CM | POA: Diagnosis not present

## 2018-10-10 ENCOUNTER — Telehealth: Payer: Self-pay | Admitting: Neurology

## 2018-10-10 NOTE — Telephone Encounter (Signed)
Thank you for notifying me 

## 2018-10-10 NOTE — Telephone Encounter (Signed)
Pt's wife advised he passed on 10-03-2018 due to parkinsons complications.   FYI

## 2018-10-14 DEATH — deceased

## 2018-10-15 ENCOUNTER — Ambulatory Visit: Payer: Medicare Other | Admitting: Neurology
